# Patient Record
Sex: Female | Born: 1965 | Race: Black or African American | Hispanic: No | Marital: Married | Smoking: Former smoker
Health system: Southern US, Community
[De-identification: ages and names within clinical notes are randomized; demographics above are authoritative.]

## PROBLEM LIST (undated history)

## (undated) DIAGNOSIS — Z720 Tobacco use: Secondary | ICD-10-CM

## (undated) DIAGNOSIS — K219 Gastro-esophageal reflux disease without esophagitis: Secondary | ICD-10-CM

## (undated) DIAGNOSIS — I10 Essential (primary) hypertension: Secondary | ICD-10-CM

## (undated) DIAGNOSIS — M1990 Unspecified osteoarthritis, unspecified site: Secondary | ICD-10-CM

## (undated) DIAGNOSIS — F411 Generalized anxiety disorder: Secondary | ICD-10-CM

## (undated) HISTORY — PX: ABDOMINAL HYSTERECTOMY: SHX81

## (undated) HISTORY — DX: Gastro-esophageal reflux disease without esophagitis: K21.9

## (undated) HISTORY — DX: Tobacco use: Z72.0

## (undated) HISTORY — PX: JOINT REPLACEMENT: SHX530

## (undated) HISTORY — DX: Generalized anxiety disorder: F41.1

## (undated) HISTORY — PX: CHOLECYSTECTOMY: SHX55

---

## 2005-01-21 ENCOUNTER — Ambulatory Visit: Payer: Self-pay

## 2005-01-22 ENCOUNTER — Other Ambulatory Visit: Payer: Self-pay

## 2005-01-22 ENCOUNTER — Observation Stay: Payer: Self-pay | Admitting: Internal Medicine

## 2005-01-23 ENCOUNTER — Other Ambulatory Visit: Payer: Self-pay

## 2005-02-13 ENCOUNTER — Ambulatory Visit: Payer: Self-pay | Admitting: Internal Medicine

## 2005-06-18 ENCOUNTER — Emergency Department: Payer: Self-pay | Admitting: Emergency Medicine

## 2005-06-19 ENCOUNTER — Other Ambulatory Visit: Payer: Self-pay

## 2008-03-22 ENCOUNTER — Other Ambulatory Visit: Payer: Self-pay

## 2008-03-22 ENCOUNTER — Emergency Department: Payer: Self-pay | Admitting: Internal Medicine

## 2008-10-23 ENCOUNTER — Emergency Department: Payer: Self-pay | Admitting: Emergency Medicine

## 2009-04-25 ENCOUNTER — Emergency Department: Payer: Self-pay | Admitting: Emergency Medicine

## 2009-11-28 ENCOUNTER — Ambulatory Visit: Payer: Self-pay | Admitting: Family Medicine

## 2010-08-16 ENCOUNTER — Emergency Department: Payer: Self-pay | Admitting: Emergency Medicine

## 2011-11-27 ENCOUNTER — Ambulatory Visit: Payer: Self-pay | Admitting: Family Medicine

## 2013-03-21 LAB — CBC
HCT: 38.6 % (ref 35.0–47.0)
HGB: 12.9 g/dL (ref 12.0–16.0)
MCHC: 33.5 g/dL (ref 32.0–36.0)
RBC: 4.65 10*6/uL (ref 3.80–5.20)
RDW: 14.7 % — ABNORMAL HIGH (ref 11.5–14.5)
WBC: 8.6 10*3/uL (ref 3.6–11.0)

## 2013-03-21 LAB — COMPREHENSIVE METABOLIC PANEL
Albumin: 4.1 g/dL (ref 3.4–5.0)
Anion Gap: 2 — ABNORMAL LOW (ref 7–16)
BUN: 22 mg/dL — ABNORMAL HIGH (ref 7–18)
Bilirubin,Total: 0.3 mg/dL (ref 0.2–1.0)
Calcium, Total: 10.2 mg/dL — ABNORMAL HIGH (ref 8.5–10.1)
Chloride: 105 mmol/L (ref 98–107)
Creatinine: 0.87 mg/dL (ref 0.60–1.30)
EGFR (Non-African Amer.): >60
Glucose: 117 mg/dL — ABNORMAL HIGH (ref 65–99)
Osmolality: 282 (ref 275–301)
SGPT (ALT): 66 U/L (ref 12–78)
Sodium: 139 mmol/L (ref 136–145)

## 2013-03-21 LAB — TROPONIN I: Troponin-I: <0.02 ng/mL

## 2013-03-22 ENCOUNTER — Observation Stay: Payer: Self-pay | Admitting: Internal Medicine

## 2013-03-22 LAB — TROPONIN I
Troponin-I: <0.02 ng/mL
Troponin-I: <0.02 ng/mL

## 2013-03-22 LAB — CK TOTAL AND CKMB (NOT AT ARMC)
CK, Total: 124 U/L (ref 21–215)
CK, Total: 122 U/L (ref 21–215)
CK-MB: <0.5 ng/mL — ABNORMAL LOW (ref 0.5–3.6)
CK-MB: <0.5 ng/mL — ABNORMAL LOW (ref 0.5–3.6)

## 2014-09-24 ENCOUNTER — Emergency Department: Payer: Self-pay | Admitting: Emergency Medicine

## 2014-11-17 NOTE — H&P (Signed)
Johnston NAME:  [name] Johnston, [name] Johnston MR#:  [id number] DATE OF BIRTH:  1965/08/08  DATE OF ADMISSION:  03/22/2013  PRIMARY CARE PHYSICIAN: Nonlocal.     REFERRING PHYSICIAN: Dr. Brandt Loosen.   CHIEF COMPLAINT: Chest pain.   HISTORY OF PRESENT ILLNESS: [name] Johnston is a 49 year old pleasant African American female with past medical history of hypertension, poorly controlled, continued tobacco use, who presented to [name] with complaints of left-sided chest pain that started about a week back. [name] pain was above her left breast area on and off pain. Could not tell exacerbating and relieving factors. Since last night she started to experience tightness and pain in [name] left arm.  Concerning this, came to [name]. Work-up in [name] with EKG and cardiac enzymes were unremarkable. [name] Johnston has history of a mother who had a heart attack in her 57s. Father had stroke in early 57s. Currently resides in a nursing home. [name] Johnston continues to smoke 1 pack a day.   PAST MEDICAL HISTORY:  Hypertension, poorly controlled.   PAST SURGICAL HISTORY:   1.  Cholecystectomy.  2.  Hysterectomy.   ALLERGIES: PENICILLIN.   HOME MEDICATIONS:  Lisinopril hydrochlorothiazide daily.   SOCIAL HISTORY: Continues to smoke 1 pack a day. Drinks alcohol over [name] weekend. Denies using any illicit drug . Married, lives with her husband.   FAMILY HISTORY: As mentioned above, mother had an MI in her 56s. Father had stroke.   REVIEW OF SYSTEMS: CONSTITUTIONAL: Denies any generalized weakness and weight loss.  EYES: No change in vision.  ENT: No change in hearing. No sore throat.  RESPIRATORY: No cough, shortness of breath. CARDIOVASCULAR: Chest pain. No pedal edema.  GASTROINTESTINAL: No nausea, vomiting, abdominal pain.  GENITOURINARY: No dysuria or hematuria.   SKIN: No rashes or lesions.  ENDOCRINE: No polyuria or polydipsia.  HEMATOLOGIC: No easy bruising or  bleeding.  MUSCULOSKELETAL: No joint pains or swelling.  NEUROLOGIC: No weakness or numbness in any part of [name] body.   PHYSICAL EXAMINATION: GENERAL: [name] Johnston is a well-built, well-nourished, age-appropriate female lying down in [name] bed, not in distress.  VITAL SIGNS: Temperature 98.5, pulse 78, blood pressure 170/86, respiratory rate of 16, oxygen saturation is 98% on room air.  HEENT: Head normocephalic, atraumatic. No sclerae icterus. Conjunctivae normal. Pupils equal and react to light. Extraocular movements are intact. Mucous membranes moist. No pharyngeal erythema.  NECK: Supple. No lymphadenopathy. No JVD. No carotid bruit. No thyromegaly.  CHEST: Has focal tenderness on [name] left side of [name] chest around [name] left shoulder joint.  LUNGS:  Bilaterally clear to auscultation.  HEART: S1, S2 regular. No murmurs are heard. No pedal edema. Pulses 2+.  ABDOMEN: Bowel sounds present. Soft, nontender, nondistended. No hepatosplenomegaly.  SKIN: No rash or lesions.  MUSCULOSKELETAL: No joint pains and aches.  NEUROLOGIC:  [name] Johnston is alert, oriented to place, person and time. Cranial nerves II through XII intact. Motor 5/5 in upper and lower extremities.   LABS: CBC: WBC is well within normal limits. CMP is completely within normal limits.   Troponin less than 0.02. EKG, 12-lead: Normal sinus rhythm with no ST-T wave abnormalities.   ASSESSMENT AND PLAN: [name] Johnston is a 49 year old female who comes to [name] with complaints of chest pain.   1.  Chest pain.  It seems this is more of a musculoskeletal pain. Rule out with cardiac enzymes. [name] Johnston can have exercise stress echo prior to discharge. 2.  Hypertension, poorly controlled. Continue with lisinopril hydrochlorothiazide. If not controlled, we will also added Norvasc to [name] regimen.  3.  Tobacco use. Counseled with [name] Johnston for 5 minutes. [name] Johnston expressed understanding and stated that [name] Johnston is going  to quit. Will provide nicotine patch.  4.  Keep [name] Johnston on deep vein thrombosis prophylaxis with Lovenox.   TIME SPENT: 45 minutes.     ____________________________ Susa GriffinsPadmaja Walker Paddack, MD pv:dp D: 03/22/2013 02:31:35 ET T: 03/22/2013 06:02:27 ET JOB#: [id number]  cc: Susa GriffinsPadmaja Langley Flatley, MD, <Dictator> Susa GriffinsPADMAJA Francille Wittmann MD ELECTRONICALLY SIGNED 03/30/2013 22:05

## 2014-11-17 NOTE — Consult Note (Signed)
General Aspect 49 year old [place] American female with history of tobacco abuse family history CAD presenting to the ER and admitted for observation of chest discomfort.  Patient described this as a left upper chest discomfort that has been present mostly for the past week.  It usually is worse in a lying position.  Exertion does not bring it on or worsening the pain.  She has tried Alka-Seltzer's, tum's and aspirin, getting the most relief from Alka-Seltzer's at home.  Currently she has complete relief of chest discomfort wearing a nitroglycerin patch but is complaining of headache.  Cardiology has been requested to evaluate patient for discharge this afternoon with return for outpatient stress testin am. Currently her discomfort is worsened with movement of the upper torso can be reproduced with palpation of chest wall, most compatible with musculoskeletal pain.  Her cardiac enzymes have been negative.No arrhythmia on telemetry. Denied any shortness of breath nausea, diaphoresis with discomfort. No acute changes on EKG. Cardiac enzymes are negative. Takes amlodipine and hydrochlorothiazide for blood pressure management.   Physical Exam:  GEN well developed, no acute distress   HEENT pink conjunctivae, hearing intact to voice   RESP normal resp effort  clear BS   CARD Regular rate and rhythm  Normal, S1, S2  No murmur  tender on palpation of the chest wall left of the sternum   ABD denies tenderness  normal BS   EXTR negative edema   SKIN normal to palpation   NEURO cranial nerves intact, motor/sensory function intact   PSYCH alert, A+O to time, place, person, good insight   Review of Systems:  Subjective/Chief Complaint Nochest pain at present.   General: headache probably from nitrates   Respiratory: history of tobacco abuse   Cardiovascular: Chest pain or discomfort  resolved   Musculoskeletal: pain with movement of the upper torso and reproduced with palpation of the sternum    Review of Systems: All other systems were reviewed and found to be negative   Lab Results: Hepatic:  25-Aug-14 22:38   Bilirubin, Total 0.3  Alkaline Phosphatase 81  SGPT (ALT) 66  SGOT (AST) 33  Total Protein, Serum 8.0  Albumin, Serum 4.1  Cardiology:  25-Aug-14 22:38   Ventricular Rate 83  Atrial Rate 83  P-R Interval 164  QRS Duration 86  QT 362  QTc 425  P Axis 49  R Axis 53  T Axis 23  ECG interpretation Normal sinus rhythm Possible Left atrial enlargement Nonspecific ST abnormality Abnormal ECG When compared with ECG of 16-Aug-2010 21:43, No significant change was found ----------unconfirmed---------- Confirmed by OVERREAD, NOT ([id number]), editor PEARSON, BARBARA ([id number]) on 03/22/2013 10:30:15 AM  Routine Chem:  25-Aug-14 22:38   Calcium (Total), Serum  10.2  Glucose, Serum  117  BUN  22  Creatinine (comp) 0.87  Sodium, Serum 139  Potassium, Serum 3.7  CO2, Serum 32  Osmolality (calc) 282  eGFR (African American) >60  eGFR (Non-African American) >60 (eGFR values <29m/min/1.73 m2 may be an indication of chronic kidney disease (CKD). Calculated eGFR is useful in patients with stable renal function. The eGFR calculation will not be reliable in acutely ill patients when serum creatinine is changing rapidly. It is not useful in  patients on dialysis. The eGFR calculation may not be applicable to patients at the low and high extremes of body sizes, pregnant women, and vegetarians.)  Anion Gap  2  Cardiac:  25-Aug-14 22:38   Troponin I < 0.02 (0.00-0.05 0.05 ng/mL or less: NEGATIVE  Repeat testing in 3-6 hrs  if clinically indicated. >0.05 ng/mL: POTENTIAL  MYOCARDIAL INJURY. Repeat  testing in 3-6 hrs if  clinically indicated. NOTE: An increase or decrease  of 30% or more on serial  testing suggests a  clinically important change)  Routine Hem:  25-Aug-14 22:38   WBC (CBC) 8.6  RBC (CBC) 4.65  Hemoglobin (CBC) 12.9  Hematocrit (CBC) 38.6  Platelet  Count (CBC) 266 (Result(s) reported on 21 Mar 2013 at 10:55PM.)  MCV 83  MCH 27.8  MCHC 33.5  RDW  14.7   Radiology Results: XRay:    25-Aug-14 22:51, Chest PA and Lateral  Chest PA and Lateral   REASON FOR EXAM:    chest pain  COMMENTS:       PROCEDURE: DXR - DXR CHEST PA (OR AP) AND LATERAL  - Mar 21 2013 10:51PM     RESULT: Comparison: None    Findings:     PA and lateral chest radiographs are provided.  There is no focal   parenchymal opacity, pleural effusion, or pneumothorax. The heart and   mediastinum are unremarkable.  The osseous structures are unremarkable.    IMPRESSION:   No acute disease of the chest.    Dictation Site: 1        Verified By: Jennette Banker, M.D., MD  Cardiology:    25-Aug-14 22:38, ED ECG  ECG interpretation   Normal sinus rhythm  Possible Left atrial enlargement  Nonspecific ST abnormality  Abnormal ECG  When compared with ECG of 16-Aug-2010 21:43,  No significant change was found  ----------unconfirmed----------  Confirmed by OVERREAD, NOT ([id number]), editor PEARSON, BARBARA ([id number]) on 03/22/2013 10:30:15 AM    Penicillin: Unknown  Vital Signs/Nurse's Notes: **Vital Signs.:   26-Aug-14 12:00  Vital Signs Type Routine  Temperature Temperature (F) 98.2  Celsius 36.7  Temperature Source oral  Pulse Pulse 64  Systolic BP Systolic BP 098  Diastolic BP (mmHg) Diastolic BP (mmHg) 52  Mean BP 72  Pulse Ox % Pulse Ox % 98  Oxygen Delivery Room Air/ 21 %  Pulse Ox Heart Rate 66    Impression 1.  Atypical chest pain possibly muscle skeletal in nature.   Plan 1.  Patient to be discharged home with  for return in a.m. for outpatient stress test. 2.  Smoking cessation encouraged. 3.  Continue current medication for blood pressure management which seems to be under good control. 4.  Further recommendations per Dr. Nehemiah Massed once results of the stress test are known.  Patient was seen in collaboration with Dr. Nehemiah Massed who agrees with the  above plan.   Electronic Signatures: Roderic Palau (NP)  (Signed 26-Aug-14 15:40)  Authored: General Aspect/Present Illness, History and Physical Exam, Review of System, Labs, Radiology, Allergies, Vital Signs/Nurse's Notes, Impression/Plan   Last Updated: 26-Aug-14 15:40 by Roderic Palau (NP)

## 2014-11-17 NOTE — Discharge Summary (Signed)
PATIENT NAME:  [name], [name] MR#:  [id number] DATE OF BIRTH:  1966-02-13  DATE OF ADMISSION:  03/22/2013 DATE OF DISCHARGE:  03/22/2013  ADMITTING DIAGNOSIS: Chest pain.   DISCHARGE DIAGNOSES:  1.  Chest pain of unclear etiology at this time, likely musculoskeletal.  2.  Mild chronic obstructive pulmonary disease, suspected.  3.  Tobacco abuse.  4.  Hypertension.   DISCHARGE CONDITION: Stable.   DISCHARGE MEDICATIONS: The patient is to continue amlodipine 10 mg p.o. daily, hydrochlorothiazide/lisinopril 12.5 mg/10 mg two tablets once daily, albuterol ipratropium CFC-free 100/20, one puff 4 times daily, this is a new medication as well as Norflex 100 mg p.o. twice daily, also new medication.   HOME OXYGEN: None.   DIET: Two grams salt, low fat, low cholesterol. Diet consistency: Regular.   ACTIVITY LIMITATIONS: As tolerated.   FOLLOWUP APPOINTMENT: Cardiology, Dr. Gwen PoundsKowalski, for stress test tomorrow on March 23, 2013. Further recommendations per Dr. Gwen PoundsKowalski according to her test results.   CONSULTANTS: Care management as well as Rudi Cocoonna Carroll, nurse practitioner, for Dr. Gwen PoundsKowalski.   RADIOLOGIC STUDIES: Chest x-ray, PA and lateral, done March 17, 2013, showed no acute disease of the chest.   The patient is a 49 year old African American female with past medical history significant for history of hypertension, who presents to the hospital with complaints of chest pains. Please refer to Dr. Clarita LeberVasireddy's admission note on March 22, 2013.   According to the patient, she was having pain for approximately a week now. Pain was above the left breast area, also associated with shoulder joint on the left side discomfort.   The patient continues to smoke at least 1 pack a day.   PHYSICAL EXAM: Unremarkable.   EKG showed normal sinus rhythm with no ST-T abnormalities.   Her labs were unremarkable. Troponin level less than 0.02. Potassium level initially on March 17, 2013, was elevated  to 10.2. Liver enzymes were normal. Cardiac enzymes were negative x3 while she was in the hospital.   The patient was evaluated by cardiology, Ms. Rudi Cocoonna Carroll, who felt that the patient had atypical chest pain and recommended to discharge the patient home, return for outpatient stress test tomorrow, on March 23, 2013. Also smoking cessation was recommended and encouraged and medication management for blood pressure.   The patient's risk factors for cardiovascular disease were evaluated. The patient did not have any other risk factors evaluated for cardiovascular disease. It was felt that patient should undergo a stress test and then certify her risks accordingly.   VITAL SIGNS: Stable on day of discharge, March 22, 2013. Temperature 98.2. Pulse was 60s to 70s. Respiratory rate was 18. Blood pressure 112/52. Saturation was 98% to 99% on room air at rest.   ASSESSMENT AND PLAN:  1.  In regard to tobacco abuse, the patient was advised to quit.  2.  For hypertension, the patient is to continue her outpatient medications. Her blood pressure was well controlled while she was in the hospital.  3.  The patient was complaining of some shortness of breath, especially on exertion. She also admitted of having some cough intermittently with no significant sputum production. It was felt that the patient may have underlying chronic obstructive pulmonary disease based on her somewhat diminished (Dictation Anomaly)auscultatory breath sounds on lung exam. She was advised to restart on Combivent as needed.  4.  In regards to musculoskeletal discomfort, she was also advised to take muscle relaxant Norflex with hopes that that will alleviate her discomfort.  5.  In regards to (Dictation Anomaly)hypokalcemia, , the patient's calcium level was rechecked and on March 22, 2013, the patient's calcium level was normal at 9.8.   The patient is being discharged with above-mentioned medications and followup.   TIME SPENT:  40 minutes on this patient.     ____________________________ Katharina Caper, MD rv:np D: 03/22/2013 18:51:00 ET T: 03/22/2013 19:35:02 ET JOB#: [id number]  cc: Katharina Caper, MD, <Dictator> Coreon Simkins MD ELECTRONICALLY SIGNED 04/16/2013 17:53

## 2015-10-24 ENCOUNTER — Other Ambulatory Visit: Payer: Self-pay | Admitting: Family Medicine

## 2015-10-24 DIAGNOSIS — Z1231 Encounter for screening mammogram for malignant neoplasm of breast: Secondary | ICD-10-CM

## 2016-04-04 ENCOUNTER — Emergency Department: Payer: Self-pay

## 2016-04-04 ENCOUNTER — Emergency Department
Admission: EM | Admit: 2016-04-04 | Discharge: 2016-04-04 | Disposition: A | Discharge status: Home / Self Care | Payer: Self-pay | Attending: Emergency Medicine | Admitting: Emergency Medicine

## 2016-04-04 DIAGNOSIS — I10 Essential (primary) hypertension: Secondary | ICD-10-CM | POA: Insufficient documentation

## 2016-04-04 DIAGNOSIS — F17200 Nicotine dependence, unspecified, uncomplicated: Secondary | ICD-10-CM | POA: Insufficient documentation

## 2016-04-04 DIAGNOSIS — R079 Chest pain, unspecified: Secondary | ICD-10-CM | POA: Insufficient documentation

## 2016-04-04 HISTORY — DX: Essential (primary) hypertension: I10

## 2016-04-04 LAB — BASIC METABOLIC PANEL
ANION GAP: 8 (ref 5–15)
BUN: 15 mg/dL (ref 6–20)
CALCIUM: 9.8 mg/dL (ref 8.9–10.3)
CHLORIDE: 108 mmol/L (ref 101–111)
CO2: 26 mmol/L (ref 22–32)
Creatinine, Ser: 0.94 mg/dL (ref 0.44–1.00)
GFR calc non Af Amer: >60 mL/min (ref >60)
Glucose, Bld: 103 mg/dL — ABNORMAL HIGH (ref 65–99)
Potassium: 4.1 mmol/L (ref 3.5–5.1)
SODIUM: 142 mmol/L (ref 135–145)

## 2016-04-04 LAB — CBC
HCT: 45.4 % (ref 35.0–47.0)
HEMOGLOBIN: 14.8 g/dL (ref 12.0–16.0)
MCH: 27 pg (ref 26.0–34.0)
MCHC: 32.5 g/dL (ref 32.0–36.0)
MCV: 83.2 fL (ref 80.0–100.0)
PLATELETS: 256 10*3/uL (ref 150–440)
RBC: 5.46 MIL/uL — AB (ref 3.80–5.20)
RDW: 14 % (ref 11.5–14.5)
WBC: 8.1 10*3/uL (ref 3.6–11.0)

## 2016-04-04 LAB — TROPONIN I

## 2016-04-04 NOTE — ED Notes (Signed)

## 2016-04-04 NOTE — ED Provider Notes (Signed)
[facility] Emergency Department Provider Note   ____________________________________________   First MD Initiated Contact with Patient 04/04/16 1429     (approximate)  I have reviewed the triage vital signs and the nursing notes.   HISTORY  Chief Complaint Chest Pain   HPI [name] is a 50 y.o. female with a history of hypertension with presented to emergency department with left-sided chest pain. She says that she has left sided chest pain intermittently and will last for several seconds. She says it is a sharp type pain without any radiation. However, she says when it is there it is a 10 out of 10. She says that it can come at any time. It does not worsen with exertion. It does not worsen with movement. She does not note any heavy lifting or injury. She says that she had a similar pain several years ago and had a stress test which was normal. She has no history of heart attack. No family history of coronary artery disease. She takes a baby aspirin daily at home as well as her blood pressure medications. She denies taking any hormone supplements of birth control. At this time. Says that she has had one episode of the pain today after which she became diaphoretic but says that she has been becoming diaphoretic intermittently lately secondary to hot flashes which she believes are from menopause.   Past Medical History:  Diagnosis Date  . Hypertension     There are no active problems to display for this patient.   History reviewed. No pertinent surgical history.  Prior to Admission medications   Not on File    Allergies Penicillins  No family history on file.  Social History Social History  Substance Use Topics  . Smoking status: Current Every Day Smoker  . Smokeless tobacco: Not on file  . Alcohol use Yes    Review of Systems Constitutional: No fever/chills Eyes: No visual changes. ENT: No sore throat. Cardiovascular: As  above Respiratory: Denies shortness of breath. Gastrointestinal: No abdominal pain.  No nausea, no vomiting.  No diarrhea.  No constipation. Genitourinary: Negative for dysuria. Musculoskeletal: Negative for back pain. Skin: Negative for rash. Neurological: Negative for headaches, focal weakness or numbness.  10-point ROS otherwise negative.  ____________________________________________   PHYSICAL EXAM:  VITAL SIGNS: ED Triage Vitals  Enc Vitals Group     BP 04/04/16 1245 (!) 157/91     Pulse Rate 04/04/16 1245 96     Resp 04/04/16 1245 18     Temp 04/04/16 1245 98.6 F (37 C)     Temp Source 04/04/16 1245 Oral     SpO2 04/04/16 1245 100 %     Weight 04/04/16 1246 179 lb (81.2 kg)     Height 04/04/16 1246 5\' 1"  (1.549 m)     Head Circumference --      Peak Flow --      Pain Score --      Pain Loc --      Pain Edu? --      Excl. in GC? --     Constitutional: Alert and oriented. Well appearing and in no acute distress. Eyes: Conjunctivae are normal. PERRL. EOMI. Head: Atraumatic. Nose: No congestion/rhinnorhea. Mouth/Throat: Mucous membranes are moist.   Neck: No stridor.   Cardiovascular: Normal rate, regular rhythm. Grossly normal heart sounds.  Good peripheral circulation With intact, equal and bilateral dorsalis pedis as well as radial pulses. Chest pain is reproducible to palpation just  left of the sternum over the area where the patient says that she has been having her pain. Respiratory: Normal respiratory effort.  No retractions. Lungs CTAB. Gastrointestinal: Soft and nontender. No distention.  Musculoskeletal: No lower extremity tenderness nor edema.  No joint effusions. Neurologic:  Normal speech and language. No gross focal neurologic deficits are appreciated.  Skin:  Skin is warm, dry and intact. No rash noted. Psychiatric: Mood and affect are normal. Speech and behavior are normal.  ____________________________________________   LABS (all labs ordered  are listed, but only abnormal results are displayed)  Labs Reviewed  BASIC METABOLIC PANEL - Abnormal; Notable for the following:       Result Value   Glucose, Bld 103 (*)    All other components within normal limits  CBC - Abnormal; Notable for the following:    RBC 5.46 (*)    All other components within normal limits  TROPONIN I   ____________________________________________  EKG  ED ECG REPORT I, Arelia LongestSchaevitz,  Dacota Ruben M, the attending physician, personally viewed and interpreted this ECG.   Date: 04/04/2016  EKG Time: 1243  Rate: 86  Rhythm: normal sinus rhythm  Axis: Normal axis  Intervals:none  ST&T Change: No ST segment elevation or depression. No abnormal T-wave inversion.  ____________________________________________  RADIOLOGY  DG Chest 2 View (Accession [id number]) (Order [id number])  Imaging  Date: 04/04/2016 Department: [facility] EMERGENCY DEPARTMENT Released By: Lynelle SmokeAllyson Y Riley, RN (auto-released) Authorizing: Myrna Blazeravid Matthew Darcey Cardy, MD  PACS Images   Show images for DG Chest 2 View  Study Result   CLINICAL DATA:  Chest pain left side of sternum, intermittent X 2 weeks, smoker, HTN  EXAM: CHEST  2 VIEW  COMPARISON:  Chest radiograph 03/21/2013  FINDINGS: Normal mediastinum and cardiac silhouette. Normal pulmonary vasculature. No evidence of effusion, infiltrate, or pneumothorax. No acute bony abnormality.  IMPRESSION: Normal chest radiograph   Electronically Signed   By: Genevive BiStewart  Edmunds M.D.   On: 04/04/2016 13:22    ____________________________________________   PROCEDURES  Procedure(s) performed:   Procedures  Critical Care performed:   ____________________________________________   INITIAL IMPRESSION / ASSESSMENT AND PLAN / ED COURSE  Pertinent labs & imaging results that were available during my care of the patient were reviewed by me and considered in my medical decision making (see chart for  details).  Patient with very reassuring lab workup as well as EKG. Symptoms have been ongoing for about a week at this time. If the patient had a serious/life threatening causes ACS I believe that would be seeing some more ominous findings on her workup. Furthermore, her history as well as physical exam would be highly atypical for ACS. Also, the patient is PERC negative.  I believe she'll be appropriate for discharge at this time. I advised her to continue taking the 81 mg of aspirin at home. She has recently but a 325 mg while of aspirin but I advised her that the 81 mg of aspirin would likely be safer/was less side effects for her risk factors. She'll be following up with her primary care doctor. She is understanding the plan as well as the diagnosis, which we reviewed and she is willing to comply. We also reviewed the lab work and imaging. Chest pain likely from chest wall pain.  Clinical Course     ____________________________________________   FINAL CLINICAL IMPRESSION(S) / ED DIAGNOSES  Chest pain.    NEW MEDICATIONS STARTED DURING THIS VISIT:  New Prescriptions   No medications  on file     Note:  This document was prepared using Dragon voice recognition software and may include unintentional dictation errors.    Myrna Blazer, MD 04/04/16 1556

## 2016-04-04 NOTE — ED Triage Notes (Signed)
Pt arrives to ER via POV c/o intermittent CP X 1 week to left side of chest. Pain this AM, that lasted approx 2 minutes. Pt alert and oriented X4, active, cooperative, pt in NAD. RR even and unlabored, color WNL.

## 2017-03-24 ENCOUNTER — Encounter: Payer: Self-pay | Admitting: Emergency Medicine

## 2017-03-24 ENCOUNTER — Emergency Department: Payer: Self-pay

## 2017-03-24 ENCOUNTER — Emergency Department
Admission: EM | Admit: 2017-03-24 | Discharge: 2017-03-24 | Disposition: A | Discharge status: Home / Self Care | Payer: Self-pay | Attending: Emergency Medicine | Admitting: Emergency Medicine

## 2017-03-24 DIAGNOSIS — R0789 Other chest pain: Secondary | ICD-10-CM | POA: Insufficient documentation

## 2017-03-24 DIAGNOSIS — Z79899 Other long term (current) drug therapy: Secondary | ICD-10-CM | POA: Insufficient documentation

## 2017-03-24 DIAGNOSIS — I10 Essential (primary) hypertension: Secondary | ICD-10-CM | POA: Insufficient documentation

## 2017-03-24 DIAGNOSIS — Z7982 Long term (current) use of aspirin: Secondary | ICD-10-CM | POA: Insufficient documentation

## 2017-03-24 DIAGNOSIS — F17200 Nicotine dependence, unspecified, uncomplicated: Secondary | ICD-10-CM | POA: Insufficient documentation

## 2017-03-24 LAB — CBC
HEMATOCRIT: 42.6 % (ref 35.0–47.0)
Hemoglobin: 13.9 g/dL (ref 12.0–16.0)
MCH: 27 pg (ref 26.0–34.0)
MCHC: 32.7 g/dL (ref 32.0–36.0)
MCV: 82.4 fL (ref 80.0–100.0)
Platelets: 282 10*3/uL (ref 150–440)
RBC: 5.17 MIL/uL (ref 3.80–5.20)
RDW: 14.1 % (ref 11.5–14.5)
WBC: 6.4 10*3/uL (ref 3.6–11.0)

## 2017-03-24 LAB — BASIC METABOLIC PANEL
ANION GAP: 8 (ref 5–15)
BUN: 18 mg/dL (ref 6–20)
CALCIUM: 9.9 mg/dL (ref 8.9–10.3)
CO2: 26 mmol/L (ref 22–32)
Chloride: 106 mmol/L (ref 101–111)
Creatinine, Ser: 0.8 mg/dL (ref 0.44–1.00)
GLUCOSE: 121 mg/dL — AB (ref 65–99)
POTASSIUM: 3.5 mmol/L (ref 3.5–5.1)
Sodium: 140 mmol/L (ref 135–145)

## 2017-03-24 LAB — TROPONIN I

## 2017-03-24 NOTE — ED Provider Notes (Signed)
[facility] Emergency Department Provider Note   ____________________________________________    I have reviewed the triage vital signs and the nursing notes.   HISTORY  Chief Complaint Chest Pain     HPI [name] is a 51 y.o. female who presents with complaints of chest pain. Patient reports left-sided chest aching that is moderate that is constant. This started at 4 AM today. She reports this has been an intermittent pain over the last 2 weeks. Does not seem to be related to exertion. No history of heart disease although mother had open heart surgery. No pleurisy. She denies shortness of breath. No recent travel. No calf pain or swelling. No fevers or chills. No nausea or vomiting or diaphoresis.    Past Medical History:  Diagnosis Date  . Hypertension     There are no active problems to display for this patient.   History reviewed. No pertinent surgical history.  Prior to Admission medications   Medication Sig Start Date End Date Taking? Authorizing Provider  amLODipine (NORVASC) 10 MG tablet Take 10 mg by mouth daily.   Yes [provider]  aspirin EC 81 MG tablet Take 162 mg by mouth daily.   Yes [provider]  busPIRone (BUSPAR) 5 MG tablet Take 5 mg by mouth 2 (two) times daily as needed for anxiety.   Yes [provider]  omeprazole (PRILOSEC) 10 MG capsule Take 10 mg by mouth daily.   Yes [provider]     Allergies Penicillins  History reviewed. No pertinent family history.  Social History Social History  Substance Use Topics  . Smoking status: Current Every Day Smoker  . Smokeless tobacco: Never Used  . Alcohol use Yes    Review of Systems  Constitutional: No fever/chills Eyes: No visual changes.  ENT: No sore throat. Cardiovascular: As above Respiratory: Denies shortness of breath. Gastrointestinal: No abdominal pain.  No nausea, no vomiting.   Genitourinary: Negative  for dysuria. Musculoskeletal: Negative for back pain. Skin: Negative for rash. Neurological: Negative for headaches   ____________________________________________   PHYSICAL EXAM:  VITAL SIGNS: ED Triage Vitals  Enc Vitals Group     BP 03/24/17 0747 (!) 156/90     Pulse Rate 03/24/17 0747 97     Resp 03/24/17 0747 18     Temp 03/24/17 0747 99.1 F (37.3 C)     Temp Source 03/24/17 0747 Oral     SpO2 03/24/17 0747 97 %     Weight 03/24/17 0744 83.5 kg (184 lb)     Height 03/24/17 0744 1.549 m (5\' 1" )     Head Circumference --      Peak Flow --      Pain Score 03/24/17 0744 7     Pain Loc --      Pain Edu? --      Excl. in GC? --     Constitutional: Alert and oriented. No acute distress.  Eyes: Conjunctivae are normal.   Nose: No congestion/rhinnorhea. Mouth/Throat: Mucous membranes are moist.    Cardiovascular: Normal rate, regular rhythm. Grossly normal heart sounds.  Good peripheral circulation. Respiratory: Normal respiratory effort.  No retractions. Lungs CTAB. Gastrointestinal: Soft and nontender. No distention.  No CVA tenderness. Genitourinary: deferred Musculoskeletal: No lower extremity tenderness nor edema.  Warm and well perfused Neurologic:  Normal speech and language. No gross focal neurologic deficits are appreciated.  Skin:  Skin is warm, dry and intact. No rash noted. Psychiatric: Mood  and affect are normal. Speech and behavior are normal.  ____________________________________________   LABS (all labs ordered are listed, but only abnormal results are displayed)  Labs Reviewed  BASIC METABOLIC PANEL - Abnormal; Notable for the following:       Result Value   Glucose, Bld 121 (*)    All other components within normal limits  CBC  TROPONIN I  TROPONIN I   ____________________________________________  EKG  ED ECG REPORT I, Jene Every, the attending physician, personally viewed and interpreted this ECG.  Date: 03/24/2017  Rate:  92 Rhythm: normal sinus rhythm QRS Axis: normal Intervals: normal ST/T Wave abnormalities: normal   ____________________________________________  RADIOLOGY  Chest x-ray pending ____________________________________________   PROCEDURES  Procedure(s) performed: No    Critical Care performed: No ____________________________________________   INITIAL IMPRESSION / ASSESSMENT AND PLAN / ED COURSE  Pertinent labs & imaging results that were available during my care of the patient were reviewed by me and considered in my medical decision making (see chart for details).  Patient presents with left-sided chest pain as described above although she reports she is feeling much better now. EKG is reassuring. We will check labs, x-ray and reevaluate  Lab work is reassuring, patient is chest pain-free, we will recheck troponin and if normal appropriate for outpatient follow-up  ----------------------------------------- 10:49 AM on 03/24/2017 ----------------------------------------- Second troponin is normal, patient remains chest pain-free.     ____________________________________________   FINAL CLINICAL IMPRESSION(S) / ED DIAGNOSES  Final diagnoses:  Atypical chest pain      NEW MEDICATIONS STARTED DURING THIS VISIT:  New Prescriptions   No medications on file     Note:  This document was prepared using Dragon voice recognition software and may include unintentional dictation errors.    Jene Every, MD 03/24/17 1050

## 2017-03-24 NOTE — ED Notes (Signed)
Pt to ed with c/o chest pain that awoke her at 4 am.  Pt states pain burning and sharp and made her feel weak all over,  Pt now reports headache.  Denies weakness now.  Also reports some dizziness with chest pain. +smoker.

## 2017-03-24 NOTE — ED Notes (Signed)
E signature pad not working.  Pt verbalized understanding of d/c instructions.

## 2017-03-24 NOTE — ED Triage Notes (Signed)
Pt c/o left sided chest pain that woke her up at 4 am this morning.  Denies any other associated sx.  Pain is constant and described as aching.  Does not radiate.  Unlabored respirations.

## 2017-08-12 ENCOUNTER — Encounter: Payer: Self-pay | Admitting: Emergency Medicine

## 2017-08-12 ENCOUNTER — Emergency Department: Payer: Managed Care, Other (non HMO)

## 2017-08-12 DIAGNOSIS — Z7982 Long term (current) use of aspirin: Secondary | ICD-10-CM | POA: Diagnosis not present

## 2017-08-12 DIAGNOSIS — Z79899 Other long term (current) drug therapy: Secondary | ICD-10-CM | POA: Diagnosis not present

## 2017-08-12 DIAGNOSIS — I10 Essential (primary) hypertension: Secondary | ICD-10-CM | POA: Insufficient documentation

## 2017-08-12 DIAGNOSIS — R079 Chest pain, unspecified: Secondary | ICD-10-CM | POA: Diagnosis not present

## 2017-08-12 DIAGNOSIS — F17200 Nicotine dependence, unspecified, uncomplicated: Secondary | ICD-10-CM | POA: Diagnosis not present

## 2017-08-12 LAB — CBC
HEMATOCRIT: 43.6 % (ref 35.0–47.0)
Hemoglobin: 14.1 g/dL (ref 12.0–16.0)
MCH: 26.6 pg (ref 26.0–34.0)
MCHC: 32.3 g/dL (ref 32.0–36.0)
MCV: 82.3 fL (ref 80.0–100.0)
Platelets: 256 10*3/uL (ref 150–440)
RBC: 5.3 MIL/uL — AB (ref 3.80–5.20)
RDW: 14.1 % (ref 11.5–14.5)
WBC: 7 10*3/uL (ref 3.6–11.0)

## 2017-08-12 LAB — BASIC METABOLIC PANEL
Anion gap: 9 (ref 5–15)
BUN: 19 mg/dL (ref 6–20)
CHLORIDE: 103 mmol/L (ref 101–111)
CO2: 28 mmol/L (ref 22–32)
Calcium: 10.6 mg/dL — ABNORMAL HIGH (ref 8.9–10.3)
Creatinine, Ser: 0.9 mg/dL (ref 0.44–1.00)
GFR calc non Af Amer: >60 mL/min (ref >60)
Glucose, Bld: 103 mg/dL — ABNORMAL HIGH (ref 65–99)
POTASSIUM: 4 mmol/L (ref 3.5–5.1)
SODIUM: 140 mmol/L (ref 135–145)

## 2017-08-12 LAB — TROPONIN I: Troponin I: <0.03 ng/mL (ref <0.03)

## 2017-08-12 NOTE — ED Triage Notes (Signed)
Pt comes into the ED via POV c/o left sided chest pain that radiates to her left arm and accompanied with Nausea, dizziness, and shortness of breath.  Denies any diagnosed cardiac history.  Patient states the pain started around 17:30 today.  Patient is alert and oriented at this time with even and unlabored respirations.

## 2017-08-13 ENCOUNTER — Emergency Department
Admission: EM | Admit: 2017-08-13 | Discharge: 2017-08-13 | Disposition: A | Discharge status: Home / Self Care | Payer: Managed Care, Other (non HMO) | Attending: Emergency Medicine | Admitting: Emergency Medicine

## 2017-08-13 DIAGNOSIS — R079 Chest pain, unspecified: Secondary | ICD-10-CM

## 2017-08-13 DIAGNOSIS — I10 Essential (primary) hypertension: Secondary | ICD-10-CM

## 2017-08-13 LAB — TROPONIN I

## 2017-08-13 NOTE — Discharge Instructions (Signed)
Fortunately today your blood work, your chest x-ray, and your EKG were reassuring.  Please make an appointment to follow-up with cardiology this coming Monday for reevaluation and return to the emergency department sooner for any concerns.  It was a pleasure to take care of you today, and thank you for coming to our emergency department.  If you have any questions or concerns before leaving please ask the nurse to grab me and I'm more than happy to go through your aftercare instructions again.  If you were prescribed any opioid pain medication today such as Norco, Vicodin, Percocet, morphine, hydrocodone, or oxycodone please make sure you do not drive when you are taking this medication as it can alter your ability to drive safely.  If you have any concerns once you are home that you are not improving or are in fact getting worse before you can make it to your follow-up appointment, please do not hesitate to call 911 and come back for further evaluation.  Merrily BrittleNeil Yanna Leaks, MD  Results for orders placed or performed during the hospital encounter of 08/13/17  Basic metabolic panel  Result Value Ref Range   Sodium 140 135 - 145 mmol/L   Potassium 4.0 3.5 - 5.1 mmol/L   Chloride 103 101 - 111 mmol/L   CO2 28 22 - 32 mmol/L   Glucose, Bld 103 (H) 65 - 99 mg/dL   BUN 19 6 - 20 mg/dL   Creatinine, Ser 1.610.90 0.44 - 1.00 mg/dL   Calcium 09.610.6 (H) 8.9 - 10.3 mg/dL   GFR calc non Af Amer >60 >60 mL/min   GFR calc Af Amer >60 >60 mL/min   Anion gap 9 5 - 15  CBC  Result Value Ref Range   WBC 7.0 3.6 - 11.0 K/uL   RBC 5.30 (H) 3.80 - 5.20 MIL/uL   Hemoglobin 14.1 12.0 - 16.0 g/dL   HCT 04.543.6 40.935.0 - 81.147.0 %   MCV 82.3 80.0 - 100.0 fL   MCH 26.6 26.0 - 34.0 pg   MCHC 32.3 32.0 - 36.0 g/dL   RDW 91.414.1 78.211.5 - 95.614.5 %   Platelets 256 150 - 440 K/uL  Troponin I  Result Value Ref Range   Troponin I <0.03 <0.03 ng/mL  Troponin I  Result Value Ref Range   Troponin I <0.03 <0.03 ng/mL   Dg Chest 2  View  Result Date: 08/12/2017 CLINICAL DATA:  Chest pain EXAM: CHEST  2 VIEW COMPARISON:  03/24/2017 FINDINGS: The heart size and mediastinal contours are within normal limits. Both lungs are clear. The visualized skeletal structures are unremarkable. IMPRESSION: No active cardiopulmonary disease. Electronically Signed   By: Jasmine PangKim  Fujinaga M.D.   On: 08/12/2017 23:17

## 2017-08-13 NOTE — ED Provider Notes (Signed)
[facility] Emergency Department Provider Note  ____________________________________________   First MD Initiated Contact with Patient 08/13/17 0205     (approximate)  I have reviewed the triage vital signs and the nursing notes.   HISTORY  Chief Complaint Chest Pain   HPI DARIONNA BANKE is a 52 y.o. female who self presents to the emergency department with constant throbbing aching moderate severity left lateral chest pain radiating to her left arm associated with nausea shortness of breath.  It is nonexertional.  She has no leg swelling.  No history of DVT or pulmonary embolism.  No recent surgery travel or immobilization.  No cardiac history.  No diaphoresis.  The pain began around 530 today several hours prior to arrival.  The pain is been constant.  Nothing seems to make it better or worse.  Past Medical History:  Diagnosis Date  . Hypertension     There are no active problems to display for this patient.   Past Surgical History:  Procedure Laterality Date  . ABDOMINAL HYSTERECTOMY    . CHOLECYSTECTOMY      Prior to Admission medications   Medication Sig Start Date End Date Taking? Authorizing Provider  amLODipine (NORVASC) 10 MG tablet Take 10 mg by mouth daily.    [provider]  aspirin EC 81 MG tablet Take 162 mg by mouth daily.    [provider]  busPIRone (BUSPAR) 5 MG tablet Take 5 mg by mouth 2 (two) times daily as needed for anxiety.    [provider]  omeprazole (PRILOSEC) 10 MG capsule Take 10 mg by mouth daily.    [provider]    Allergies Penicillins  No family history on file.  Social History Social History   Tobacco Use  . Smoking status: Current Every Day Smoker  . Smokeless tobacco: Never Used  Substance Use Topics  . Alcohol use: Yes  . Drug use: No    Review of Systems Constitutional: No fever/chills Eyes: No visual changes. ENT: No sore throat. Cardiovascular:  Positive for chest pain. Respiratory: Positive for shortness of breath. Gastrointestinal: No abdominal pain.  Positive for nausea, no vomiting.  No diarrhea.  No constipation. Genitourinary: Negative for dysuria. Musculoskeletal: Negative for back pain. Skin: Negative for rash. Neurological: Negative for headaches, focal weakness or numbness.   ____________________________________________   PHYSICAL EXAM:  VITAL SIGNS: ED Triage Vitals  Enc Vitals Group     BP 08/12/17 2258 (!) 199/94     Pulse Rate 08/12/17 2258 87     Resp 08/12/17 2258 18     Temp 08/12/17 2258 98.2 F (36.8 C)     Temp Source 08/12/17 2258 Oral     SpO2 08/12/17 2258 97 %     Weight 08/12/17 2257 182 lb (82.6 kg)     Height 08/12/17 2257 5\' 1"  (1.549 m)     Head Circumference --      Peak Flow --      Pain Score 08/12/17 2256 6     Pain Loc --      Pain Edu? --      Excl. in GC? --     Constitutional: Alert and oriented x4 somewhat anxious appearing nontoxic no diaphoresis speaks full clear sentences Eyes: PERRL EOMI. Head: Atraumatic. Nose: No congestion/rhinnorhea. Mouth/Throat: No trismus Neck: No stridor.  Able lie completely flat with no JVD cardiovascular: Normal rate, regular rhythm. Grossly normal heart sounds.  Good peripheral circulation. Respiratory: Normal respiratory effort.  No retractions. Lungs CTAB and moving good air Gastrointestinal: Obese soft nontender Musculoskeletal: No lower extremity edema   Neurologic:  Normal speech and language. No gross focal neurologic deficits are appreciated. Skin:  Skin is warm, dry and intact. No rash noted. Psychiatric: Mood and affect are normal. Speech and behavior are normal.    ____________________________________________   DIFFERENTIAL includes but not limited to  Acute coronary syndrome, pulmonary embolism, aortic dissection, pneumothorax, muscular skeletal pain ____________________________________________   LABS (all labs ordered  are listed, but only abnormal results are displayed)  Labs Reviewed  BASIC METABOLIC PANEL - Abnormal; Notable for the following components:      Result Value   Glucose, Bld 103 (*)    Calcium 10.6 (*)    All other components within normal limits  CBC - Abnormal; Notable for the following components:   RBC 5.30 (*)    All other components within normal limits  TROPONIN I  TROPONIN I    Lab work reviewed by me with negative troponin x2 __________________________________________  EKG  ED ECG REPORT I, Merrily BrittleNeil Seba Madole, the attending physician, personally viewed and interpreted this ECG.  Date: 08/13/2017 EKG Time:  Rate: 82 Rhythm: normal sinus rhythm QRS Axis: normal Intervals: normal ST/T Wave abnormalities: normal Narrative Interpretation: no evidence of acute ischemia  ____________________________________________  RADIOLOGY  Chest x-ray reviewed by me with no acute disease ____________________________________________   PROCEDURES  Procedure(s) performed: no  Procedures  Critical Care performed: no  Observation: no ____________________________________________   INITIAL IMPRESSION / ASSESSMENT AND PLAN / ED COURSE  Pertinent labs & imaging results that were available during my care of the patient were reviewed by me and considered in my medical decision making (see chart for details).       ----------------------------------------- 3:58 AM on 08/13/2017 -----------------------------------------  Fortunately the patient's second troponin came back negative.  She has a heart score of 3.  I had a lengthy discussion with the patient regarding the diagnostic uncertainty, however at this point did not believe she requires inpatient admission for inpatient risk stratification.  I will refer her to cardiology as an outpatient.  Strict return precautions have been given and the patient verbalized understanding agree with plan.   ____________________________________________   FINAL CLINICAL IMPRESSION(S) / ED DIAGNOSES  Final diagnoses:  Hypertension, unspecified type  Nonspecific chest pain      NEW MEDICATIONS STARTED DURING THIS VISIT:  Discharge Medication List as of 08/13/2017  3:58 AM       Note:  This document was prepared using Dragon voice recognition software and may include unintentional dictation errors.     Merrily Brittleifenbark, Lyden Redner, MD 08/13/17 2238

## 2017-08-18 ENCOUNTER — Encounter: Payer: Self-pay | Admitting: Family Medicine

## 2017-08-18 ENCOUNTER — Ambulatory Visit (INDEPENDENT_AMBULATORY_CARE_PROVIDER_SITE_OTHER): Payer: Managed Care, Other (non HMO) | Admitting: Family Medicine

## 2017-08-18 VITALS — BP 138/84 | HR 92 | Temp 98.3°F | Resp 16 | Ht 61.0 in | Wt 183.3 lb

## 2017-08-18 DIAGNOSIS — Z1231 Encounter for screening mammogram for malignant neoplasm of breast: Secondary | ICD-10-CM | POA: Diagnosis not present

## 2017-08-18 DIAGNOSIS — Z23 Encounter for immunization: Secondary | ICD-10-CM | POA: Diagnosis not present

## 2017-08-18 DIAGNOSIS — Z131 Encounter for screening for diabetes mellitus: Secondary | ICD-10-CM | POA: Diagnosis not present

## 2017-08-18 DIAGNOSIS — F410 Panic disorder [episodic paroxysmal anxiety] without agoraphobia: Secondary | ICD-10-CM | POA: Diagnosis not present

## 2017-08-18 DIAGNOSIS — Z720 Tobacco use: Secondary | ICD-10-CM | POA: Insufficient documentation

## 2017-08-18 DIAGNOSIS — K219 Gastro-esophageal reflux disease without esophagitis: Secondary | ICD-10-CM | POA: Insufficient documentation

## 2017-08-18 DIAGNOSIS — I517 Cardiomegaly: Secondary | ICD-10-CM

## 2017-08-18 DIAGNOSIS — Z1239 Encounter for other screening for malignant neoplasm of breast: Secondary | ICD-10-CM

## 2017-08-18 DIAGNOSIS — Z1211 Encounter for screening for malignant neoplasm of colon: Secondary | ICD-10-CM | POA: Diagnosis not present

## 2017-08-18 DIAGNOSIS — I10 Essential (primary) hypertension: Secondary | ICD-10-CM | POA: Insufficient documentation

## 2017-08-18 DIAGNOSIS — Z13220 Encounter for screening for lipoid disorders: Secondary | ICD-10-CM | POA: Diagnosis not present

## 2017-08-18 DIAGNOSIS — Z114 Encounter for screening for human immunodeficiency virus [HIV]: Secondary | ICD-10-CM | POA: Diagnosis not present

## 2017-08-18 DIAGNOSIS — F411 Generalized anxiety disorder: Secondary | ICD-10-CM | POA: Insufficient documentation

## 2017-08-18 DIAGNOSIS — R0683 Snoring: Secondary | ICD-10-CM | POA: Insufficient documentation

## 2017-08-18 DIAGNOSIS — R011 Cardiac murmur, unspecified: Secondary | ICD-10-CM | POA: Insufficient documentation

## 2017-08-18 MED ORDER — RANITIDINE HCL 300 MG PO TABS: 300.0000 mg | ORAL_TABLET | Freq: Every day | ORAL | 0 refills | Status: DC | PRN

## 2017-08-18 MED ORDER — ESCITALOPRAM OXALATE 10 MG PO TABS: 10.0000 mg | ORAL_TABLET | Freq: Every day | ORAL | 1 refills | Status: DC

## 2017-08-18 MED ORDER — HYDRALAZINE HCL 10 MG PO TABS: 10.0000 mg | ORAL_TABLET | Freq: Two times a day (BID) | ORAL | 0 refills | Status: DC | PRN

## 2017-08-18 MED ORDER — HYDROCHLOROTHIAZIDE 12.5 MG PO TABS: 12.5000 mg | ORAL_TABLET | Freq: Every day | ORAL | 0 refills | Status: DC

## 2017-08-18 MED ORDER — BUSPIRONE HCL 5 MG PO TABS: 5.0000 mg | ORAL_TABLET | Freq: Two times a day (BID) | ORAL | 1 refills | Status: DC | PRN

## 2017-08-18 NOTE — Progress Notes (Signed)
Name: [name]   MRN: [id number]    DOB: January 31, 1966   Date:08/18/2017       Progress Note  Subjective  Chief Complaint  Chief Complaint  Patient presents with  . Establish Care  . Hypertension    Denies any symptoms  . Gastroesophageal Reflux    Well controlled with medication  . Anxiety    Does not feel like Buspar works- worries about everything all the time    HPI  HTN: she was diagnosed many years ago and [facility], however [facility] was [facility]. She went to [facility] this past week with very high bp and was sent here for follow up. She states bp goes up when nervous or during a panic attack, she was taking losartan/hctz and norvasc years ago but stopped on her own because of it caused headaches. She is currently feeling well.   Metabolic syndrome: she was told last year she has pre-diabetes but no labs since. She has polydipsia, she also has polyuria and episodes of polyphagia, we will recheck labs  Hypercalcemia: found while at the [facility], with symptoms of polydipsia and reflux we will check for hyperparathyroidism  GERD: used to take omeprazole but now only occasionally, discussed risk of long term use and she agrees on trying taking Ranitidine prn instead  Tobacco use: smokes half pack daily, willing to quit, she denies cough , SOB or wheezing.   GAD/Panic attacks: she states developed anxiety when her son was incarcerated over 7 years ago for dealing drugs. He is already out prison and doing well, but she continues to feel fearful. She is afraid that someone will break in her house, and recently had a panic attack while at work because there was a gentleman pacing in front of her job and she could not calm down, causing her to go to [facility]. She did well with buspar in the past  EKG during [facility] visit: showed enlarged left atrium, also history of snoring and daytime fatigue/sleepiness. We will check sleep study.  Patient Active Problem List   Diagnosis Date Noted   . Hypertension 08/18/2017  . GAD (generalized anxiety disorder) 08/18/2017  . GERD (gastroesophageal reflux disease) 08/18/2017  . Panic attack 08/18/2017  . Tobacco use 08/18/2017  . Snoring 08/18/2017    Past Surgical History:  Procedure Laterality Date  . ABDOMINAL HYSTERECTOMY     Complete Hysterectomy  . CHOLECYSTECTOMY      Family History  Problem Relation Age of Onset  . Hypertension Mother   . Stroke Father   . Hypertension Father   . Hypertension Sister     Social History   Socioeconomic History  . Marital status: Married    Spouse name: [name]   . Number of children: 3  . Years of education: Not on file  . Highest education level: 12th grade  Social Needs  . Financial resource strain: Not hard at all  . Food insecurity - worry: Never true  . Food insecurity - inability: Never true  . Transportation needs - medical: No  . Transportation needs - non-medical: No  Occupational History  . Occupation: medicare audits   Tobacco Use  . Smoking status: Current Every Day Smoker    Packs/day: 0.50    Years: 18.00    Pack years: 9.00    Types: Cigarettes    Start date: 08/19/1999  . Smokeless tobacco: Never Used  Substance and Sexual Activity  . Alcohol use: Yes  Alcohol/week: 1.8 oz    Types: 3 Glasses of wine per week  . Drug use: No  . Sexual activity: Yes    Partners: Male    Birth control/protection: Other-see comments    Comment: Hysterectomy  Other Topics Concern  . Not on file  Social History Narrative   Re-married since 2012   Only the two of them at home   She has three children from previous relationship and he has one   She works a full at [employer] and part time at [employer]    Her only son ( youngest child) went to prison for 3 years ( from 2014 - 2017), since than she has been anxious and scared of darkness.      Current Outpatient Medications:  .  aspirin EC 81 MG tablet, Take 162 mg by mouth daily., Disp: , Rfl:   .  busPIRone (BUSPAR) 5 MG tablet, Take 1 tablet (5 mg total) by mouth 2 (two) times daily as needed., Disp: 60 tablet, Rfl: 1 .  escitalopram (LEXAPRO) 10 MG tablet, Take 1 tablet (10 mg total) by mouth daily., Disp: 30 tablet, Rfl: 1 .  hydrALAZINE (APRESOLINE) 10 MG tablet, Take 1 tablet (10 mg total) by mouth 2 (two) times daily as needed. For bp above 150/90, Disp: 60 tablet, Rfl: 0 .  hydrochlorothiazide (HYDRODIURIL) 12.5 MG tablet, Take 1 tablet (12.5 mg total) by mouth daily., Disp: 90 tablet, Rfl: 0 .  ranitidine (ZANTAC) 300 MG tablet, Take 1 tablet (300 mg total) by mouth daily as needed for heartburn., Disp: 90 tablet, Rfl: 0  Allergies  Allergen Reactions  . Penicillins Itching    Has patient had a PCN reaction causing immediate rash, facial/tongue/throat swelling, SOB or lightheadedness with hypotension: No Has patient had a PCN reaction causing severe rash involving mucus membranes or skin necrosis: No Has patient had a PCN reaction that required hospitalization: No Has patient had a PCN reaction occurring within the last 10 years: No If all of the above answers are "NO", then may proceed with Cephalosporin use.      ROS  Constitutional: Negative for fever or weight change.  Respiratory: Negative for cough and shortness of breath.   Cardiovascular: Positive  for intermittent chest pain and palpitations.  Gastrointestinal: Negative for abdominal pain, no bowel changes.  Musculoskeletal: Negative for gait problem or joint swelling.  Skin: Negative for rash.  Neurological: Negative for dizziness or headache.  No other specific complaints in a complete review of systems (except as listed in HPI above).  Objective  Vitals:   08/18/17 1451  BP: 138/84  Pulse: 92  Resp: 16  Temp: 98.3 F (36.8 C)  TempSrc: Oral  SpO2: 98%  Weight: 183 lb 4.8 oz (83.1 kg)  Height: _0  (1.549 m)    Body mass index is 34.63 kg/m.  Physical Exam  Constitutional: Patient  appears well-developed and well-nourished. Obese No distress.  HEENT: head atraumatic, normocephalic, pupils equal and reactive to light, neck supple, throat within normal limits Cardiovascular: Normal rate, regular rhythm and normal heart sounds. Systolic ejection  murmur heard. on second right intercostal spaceNo BLE edema. Pulmonary/Chest: Effort normal and breath sounds normal. No respiratory distress. Abdominal: Soft.  There is no tenderness. Psychiatric: Patient has a normal mood and affect. behavior is normal. Judgment and thought content normal.  Recent Results (from the past 2160 hour(s))  Basic metabolic panel     Status: Abnormal   Collection Time: 08/12/17 10:58  PM  Result Value Ref Range   Sodium 140 135 - 145 mmol/L   Potassium 4.0 3.5 - 5.1 mmol/L   Chloride 103 101 - 111 mmol/L   CO2 28 22 - 32 mmol/L   Glucose, Bld 103 (H) 65 - 99 mg/dL   BUN 19 6 - 20 mg/dL   Creatinine, Ser 0.90 0.44 - 1.00 mg/dL   Calcium 10.6 (H) 8.9 - 10.3 mg/dL   GFR calc non Af Amer >60 >60 mL/min   GFR calc Af Amer >60 >60 mL/min    Comment: (NOTE) The eGFR has been calculated using the CKD EPI equation. This calculation has not been validated in all clinical situations. eGFR's persistently <60 mL/min signify possible Chronic Kidney Disease.    Anion gap 9 5 - 15    Comment: Performed at [facility], [address] [place], [place] [address]  CBC     Status: Abnormal   Collection Time: 08/12/17 10:58 PM  Result Value Ref Range   WBC 7.0 3.6 - 11.0 K/uL   RBC 5.30 (H) 3.80 - 5.20 MIL/uL   Hemoglobin 14.1 12.0 - 16.0 g/dL   HCT 43.6 35.0 - 47.0 %   MCV 82.3 80.0 - 100.0 fL   MCH 26.6 26.0 - 34.0 pg   MCHC 32.3 32.0 - 36.0 g/dL   RDW 14.1 11.5 - 14.5 %   Platelets 256 150 - 440 K/uL    Comment: Performed at [facility], [address] [place], [place] [address]  Troponin I     Status: None   Collection Time: 08/12/17 10:58 PM  Result Value Ref Range   Troponin  I <0.03 <0.03 ng/mL    Comment: Performed at [facility], [address] [place], [place] [address]  Troponin I     Status: None   Collection Time: 08/13/17  2:24 AM  Result Value Ref Range   Troponin I <0.03 <0.03 ng/mL    Comment: Performed at [facility], [address] [place], [place] [address]   GAD 7 : Generalized Anxiety Score 08/18/2017  Nervous, Anxious, on Edge 3  Control/stop worrying 2  Worry too much - different things 3  Trouble relaxing 2  Restless 2  Easily annoyed or irritable 1  Afraid - awful might happen 3  Total GAD 7 Score 16  Anxiety Difficulty Very difficult     PHQ2/9: Depression screen PHQ 2/9 08/18/2017  Decreased Interest 0  Down, Depressed, Hopeless 0  PHQ - 2 Score 0     Fall Risk: Fall Risk  08/18/2017  Falls in the past year? No     Functional Status Survey: Is the patient deaf or have difficulty hearing?: No Does the patient have difficulty seeing, even when wearing glasses/contacts?: No Does the patient have difficulty concentrating, remembering, or making decisions?: No Does the patient have difficulty walking or climbing stairs?: No Does the patient have difficulty dressing or bathing?: No Does the patient have difficulty doing errands alone such as visiting a doctor's office or shopping?: No    Assessment & Plan  1. Essential hypertension  bp was very high during EC visit, likely from panic attack, today without any medication is only slightly elevated, we will only resume HCTZ, stop Losartan and Norvasc ( since she has not taken it in over one year)  - hydrochlorothiazide (HYDRODIURIL) 12.5 MG tablet; Take 1 tablet (12.5 mg total) by mouth daily.  Dispense: 90 tablet; Refill: 0 - hydrALAZINE (APRESOLINE)  10 MG tablet; Take 1 tablet (10 mg total) by mouth 2 (two) times daily as needed. For bp above 150/90  Dispense: 60 tablet; Refill: 0 - Comprehensive metabolic panel - TSH  2. GAD  - busPIRone  (BUSPAR) 5 MG tablet; Take 1 tablet (5 mg total) by mouth 2 (two) times daily as needed.  Dispense: 60 tablet; Refill: 1 - escitalopram (LEXAPRO) 10 MG tablet; Take 1 tablet (10 mg total) by mouth daily.  Dispense: 30 tablet; Refill: 1  3. Gastroesophageal reflux disease without esophagitis  - ranitidine (ZANTAC) 300 MG tablet; Take 1 tablet (300 mg total) by mouth daily as needed for heartburn.  Dispense: 90 tablet; Refill: 0  4. Panic attack   5. Lipid screening  - Lipid panel  6. Encounter for screening colonoscopy  - Cologuard  7. Breast cancer screening  - MM Digital Screening; Future  8. Need for Tdap vaccination  - Tdap vaccine greater than or equal to 7yo IM  9. Needs flu shot  - Flu Vaccine QUAD 36+ mos IM  10. Encounter for screening for HIV  - HIV antibody  11. Hypercalcemia  - Hemoglobin A1c - PTH, Intact and Calcium  12. Diabetes mellitus screening  - Lipid panel  13. Tobacco use  Discussed importance of quitting and strategies to quit  14. Snoring  - Ambulatory referral to Sleep Studies  15. Enlarged LA (left atrium)  - ECHOCARDIOGRAM LIMITED; Future

## 2017-08-18 NOTE — Patient Instructions (Signed)
Steps to Quit Smoking Smoking tobacco can be bad for your health. It can also affect almost every organ in your body. Smoking puts you and people around you at risk for many serious long-lasting (chronic) diseases. Quitting smoking is hard, but it is one of the best things that you can do for your health. It is never too late to quit. What are the benefits of quitting smoking? When you quit smoking, you lower your risk for getting serious diseases and conditions. They can include:  Lung cancer or lung disease.  Heart disease.  Stroke.  Heart attack.  Not being able to have children (infertility).  Weak bones (osteoporosis) and broken bones (fractures).  If you have coughing, wheezing, and shortness of breath, those symptoms may get better when you quit. You may also get sick less often. If you are pregnant, quitting smoking can help to lower your chances of having a baby of low birth weight. What can I do to help me quit smoking? Talk with your doctor about what can help you quit smoking. Some things you can do (strategies) include:  Quitting smoking totally, instead of slowly cutting back how much you smoke over a period of time.  Going to in-person counseling. You are more likely to quit if you go to many counseling sessions.  Using resources and support systems, such as: ? Online chats with a counselor. ? Phone quitlines. ? Printed self-help materials. ? Support groups or group counseling. ? Text messaging programs. ? Mobile phone apps or applications.  Taking medicines. Some of these medicines may have nicotine in them. If you are pregnant or breastfeeding, do not take any medicines to quit smoking unless your doctor says it is okay. Talk with your doctor about counseling or other things that can help you.  Talk with your doctor about using more than one strategy at the same time, such as taking medicines while you are also going to in-person counseling. This can help make  quitting easier. What things can I do to make it easier to quit? Quitting smoking might feel very hard at first, but there is a lot that you can do to make it easier. Take these steps:  Talk to your family and friends. Ask them to support and encourage you.  Call phone quitlines, reach out to support groups, or work with a counselor.  Ask people who smoke to not smoke around you.  Avoid places that make you want (trigger) to smoke, such as: ? Bars. ? Parties. ? Smoke-break areas at work.  Spend time with people who do not smoke.  Lower the stress in your life. Stress can make you want to smoke. Try these things to help your stress: ? Getting regular exercise. ? Deep-breathing exercises. ? Yoga. ? Meditating. ? Doing a body scan. To do this, close your eyes, focus on one area of your body at a time from head to toe, and notice which parts of your body are tense. Try to relax the muscles in those areas.  Download or buy apps on your mobile phone or tablet that can help you stick to your quit plan. There are many free apps, such as QuitGuide from the CDC (Centers for Disease Control and Prevention). You can find more support from smokefree.gov and other websites.  This information is not intended to replace advice given to you by your health care provider. Make sure you discuss any questions you have with your health care provider. Document Released: 05/10/2009 Document   Revised: 03/11/2016 Document Reviewed: 11/28/2014 Elsevier Interactive Patient Education  2018 [employer]  

## 2017-08-20 LAB — LIPID PANEL
Chol/HDL Ratio: 4 ratio (ref 0.0–4.4)
Cholesterol, Total: 181 mg/dL (ref 100–199)
HDL: 45 mg/dL (ref >39)
LDL Calculated: 118 mg/dL — ABNORMAL HIGH (ref 0–99)
Triglycerides: 88 mg/dL (ref 0–149)
VLDL Cholesterol Cal: 18 mg/dL (ref 5–40)

## 2017-08-20 LAB — COMPREHENSIVE METABOLIC PANEL
ALT: 52 IU/L — ABNORMAL HIGH (ref 0–32)
AST: 31 IU/L (ref 0–40)
Albumin/Globulin Ratio: 2 (ref 1.2–2.2)
Albumin: 5.3 g/dL (ref 3.5–5.5)
Alkaline Phosphatase: 69 IU/L (ref 39–117)
BUN/Creatinine Ratio: 22 (ref 9–23)
BUN: 19 mg/dL (ref 6–24)
Bilirubin Total: 0.5 mg/dL (ref 0.0–1.2)
CO2: 24 mmol/L (ref 20–29)
Calcium: 10.4 mg/dL — ABNORMAL HIGH (ref 8.7–10.2)
Chloride: 101 mmol/L (ref 96–106)
Creatinine, Ser: 0.86 mg/dL (ref 0.57–1.00)
GFR calc Af Amer: 91 mL/min/{1.73_m2} (ref >59)
GFR calc non Af Amer: 79 mL/min/{1.73_m2} (ref >59)
Globulin, Total: 2.6 g/dL (ref 1.5–4.5)
Glucose: 104 mg/dL — ABNORMAL HIGH (ref 65–99)
Potassium: 4.5 mmol/L (ref 3.5–5.2)
Sodium: 141 mmol/L (ref 134–144)
Total Protein: 7.9 g/dL (ref 6.0–8.5)

## 2017-08-20 LAB — TSH: TSH: 2.57 u[IU]/mL (ref 0.450–4.500)

## 2017-08-20 LAB — PTH, INTACT AND CALCIUM: PTH: 34 pg/mL (ref 15–65)

## 2017-08-20 LAB — HEMOGLOBIN A1C
Est. average glucose Bld gHb Est-mCnc: 134 mg/dL
Hgb A1c MFr Bld: 6.3 % — ABNORMAL HIGH (ref 4.8–5.6)

## 2017-08-21 ENCOUNTER — Ambulatory Visit: Payer: Self-pay | Admitting: Internal Medicine

## 2017-08-25 ENCOUNTER — Encounter: Payer: Self-pay | Admitting: Family Medicine

## 2017-08-25 DIAGNOSIS — R739 Hyperglycemia, unspecified: Secondary | ICD-10-CM | POA: Insufficient documentation

## 2017-08-28 DIAGNOSIS — E782 Mixed hyperlipidemia: Secondary | ICD-10-CM | POA: Insufficient documentation

## 2017-09-09 ENCOUNTER — Ambulatory Visit
Admission: RE | Admit: 2017-09-09 | Discharge: 2017-09-09 | Disposition: A | Discharge status: Home / Self Care | Payer: Managed Care, Other (non HMO) | Source: Ambulatory Visit | Attending: Family Medicine | Admitting: Family Medicine

## 2017-09-09 ENCOUNTER — Other Ambulatory Visit: Payer: Self-pay | Admitting: Family Medicine

## 2017-09-09 DIAGNOSIS — Z1239 Encounter for other screening for malignant neoplasm of breast: Secondary | ICD-10-CM

## 2017-09-09 DIAGNOSIS — Z1231 Encounter for screening mammogram for malignant neoplasm of breast: Secondary | ICD-10-CM | POA: Insufficient documentation

## 2017-09-21 ENCOUNTER — Ambulatory Visit: Payer: Self-pay | Admitting: Internal Medicine

## 2017-09-22 ENCOUNTER — Encounter: Payer: Self-pay | Admitting: Family Medicine

## 2017-09-22 LAB — COLOGUARD: Cologuard: NEGATIVE

## 2017-09-23 DIAGNOSIS — R0602 Shortness of breath: Secondary | ICD-10-CM | POA: Insufficient documentation

## 2017-09-23 DIAGNOSIS — I119 Hypertensive heart disease without heart failure: Secondary | ICD-10-CM | POA: Insufficient documentation

## 2017-10-01 ENCOUNTER — Ambulatory Visit: Payer: Managed Care, Other (non HMO) | Admitting: Family Medicine

## 2017-10-07 ENCOUNTER — Ambulatory Visit: Payer: Managed Care, Other (non HMO) | Admitting: Family Medicine

## 2017-10-27 ENCOUNTER — Ambulatory Visit (INDEPENDENT_AMBULATORY_CARE_PROVIDER_SITE_OTHER): Payer: Managed Care, Other (non HMO) | Admitting: Family Medicine

## 2017-10-27 ENCOUNTER — Encounter: Payer: Self-pay | Admitting: Family Medicine

## 2017-10-27 VITALS — BP 180/110 | HR 90 | Resp 16 | Ht 61.0 in | Wt 184.8 lb

## 2017-10-27 DIAGNOSIS — R739 Hyperglycemia, unspecified: Secondary | ICD-10-CM | POA: Diagnosis not present

## 2017-10-27 DIAGNOSIS — F411 Generalized anxiety disorder: Secondary | ICD-10-CM

## 2017-10-27 DIAGNOSIS — E782 Mixed hyperlipidemia: Secondary | ICD-10-CM

## 2017-10-27 DIAGNOSIS — R7303 Prediabetes: Secondary | ICD-10-CM | POA: Diagnosis not present

## 2017-10-27 DIAGNOSIS — Z720 Tobacco use: Secondary | ICD-10-CM

## 2017-10-27 DIAGNOSIS — I119 Hypertensive heart disease without heart failure: Secondary | ICD-10-CM

## 2017-10-27 DIAGNOSIS — I10 Essential (primary) hypertension: Secondary | ICD-10-CM

## 2017-10-27 MED ORDER — AMLODIPINE BESYLATE 5 MG PO TABS: 5.0000 mg | ORAL_TABLET | Freq: Every day | ORAL | 1 refills | Status: DC

## 2017-10-27 MED ORDER — HYDROCHLOROTHIAZIDE 12.5 MG PO TABS: 12.5000 mg | ORAL_TABLET | Freq: Every day | ORAL | 0 refills | Status: DC

## 2017-10-27 MED ORDER — SEMAGLUTIDE(0.25 OR 0.5MG/DOS) 2 MG/1.5ML ~~LOC~~ SOPN: 0.5000 mg | PEN_INJECTOR | SUBCUTANEOUS | 2 refills | Status: DC

## 2017-10-27 MED ORDER — VARENICLINE TARTRATE 1 MG PO TABS: 1.0000 mg | ORAL_TABLET | Freq: Two times a day (BID) | ORAL | 2 refills | Status: DC

## 2017-10-27 MED ORDER — BUSPIRONE HCL 5 MG PO TABS: 5.0000 mg | ORAL_TABLET | Freq: Every evening | ORAL | 0 refills | Status: DC

## 2017-10-27 MED ORDER — VARENICLINE TARTRATE 0.5 MG X 11 & 1 MG X 42 PO MISC: ORAL | 0 refills | Status: DC

## 2017-10-27 MED ORDER — ESCITALOPRAM OXALATE 10 MG PO TABS: 10.0000 mg | ORAL_TABLET | Freq: Every day | ORAL | 0 refills | Status: DC

## 2017-10-27 NOTE — Progress Notes (Signed)
Name: [name]   MRN: [id number]    DOB: 1966/03/30   Date:10/27/2017       Progress Note  Subjective  Chief Complaint  Chief Complaint  Patient presents with  . Hypertension  . Results  . Obesity    HPI   HTN: she was diagnosed many years ago and [facility], however last visit was 2018. She went to [facility] this past week with very high bp and was sent here for follow up. She states bp goes up when nervous or during a panic attack, she was taking losartan/hctz and norvasc years ago but stopped on her own because of it caused headaches. She was seen by Dr. Nehemiah Massed and advised to go back on Norvasc, but she did not fill rx. She will try it again, but will call back to switch to Bystolic if she develops a headache.   LVH: due to hypertension, also has snoring, we ordered sleep study in January but she did not hear about appointment. We will verify for her today. No PND or orthopnea , no leg edema  Metabolic syndrome: BRAX0N 6.2%, fasting glucose elevated for over one year.  She has polydipsia, she also has polyuria and episodes of polyphagia. We discussed options and she is willing to try Ozempic ( off label indication), discussed possible side effects with patient today.   Hypercalcemia:  with symptoms of polydipsia and reflux ,parathyroid hormone was normal, likely from diuretics but we will monitor for now  GERD: she is off Omeprazole and is taking Ranitidine prn only now.   Tobacco use: smokes half pack daily, willing to quit, she denies cough , SOB or wheezing. She picked April 16 th as quitting date, she will start walking with a friend to replace the habit, she will start Chantix next Monday - since she will start Ozempic today   GAD/Panic attacks: she states developed anxiety when her son was incarcerated over 7 years ago for dealing drugs. He is already out prison and doing well, but she was very fearful still on her last visit 07/2017. She states  Buspar has helped, she takes it the pm at work and has been able to go home alone and start cooking before husband gets home. No side effects of new medication. Also taking Lexapro.    Patient Active Problem List   Diagnosis Date Noted  . LVH (left ventricular hypertrophy) due to hypertensive disease, without heart failure 09/23/2017  . SOBOE (shortness of breath on exertion) 09/23/2017  . Hyperlipidemia, mixed 08/28/2017  . Hyperglycemia 08/25/2017  . Hypercalcemia due to thiazide and vitamin A 08/25/2017  . Hypertension 08/18/2017  . GAD (generalized anxiety disorder) 08/18/2017  . GERD (gastroesophageal reflux disease) 08/18/2017  . Panic attack 08/18/2017  . Tobacco use 08/18/2017  . Snoring 08/18/2017  . Systolic ejection murmur 40/76/8088    Past Surgical History:  Procedure Laterality Date  . ABDOMINAL HYSTERECTOMY     Complete Hysterectomy  . CHOLECYSTECTOMY      Family History  Problem Relation Age of Onset  . Hypertension Mother   . Stroke Father   . Hypertension Father   . Hypertension Sister   . Breast cancer Neg Hx     Social History   Socioeconomic History  . Marital status: Married    Spouse name: [name]   . Number of children: 3  . Years of education: Not on file  . Highest education level: 12th grade  Occupational History  .  Occupation: Furniture conservator/restorer   Social Needs  . Financial resource strain: Not hard at all  . Food insecurity:    Worry: Never true    Inability: Never true  . Transportation needs:    Medical: No    Non-medical: No  Tobacco Use  . Smoking status: Current Every Day Smoker    Packs/day: 0.50    Years: 18.00    Pack years: 9.00    Types: Cigarettes    Start date: 08/19/1999  . Smokeless tobacco: Never Used  Substance and Sexual Activity  . Alcohol use: Yes    Alcohol/week: 0.6 oz    Types: 1 Glasses of wine per week  . Drug use: No  . Sexual activity: Yes    Partners: Male    Birth control/protection: Other-see  comments    Comment: Hysterectomy  Lifestyle  . Physical activity:    Days per week: 5 days    Minutes per session: 20 min  . Stress: Rather much  Relationships  . Social connections:    Talks on phone: More than three times a week    Gets together: Twice a week    Attends religious service: More than 4 times per year    Active member of club or organization: Yes    Attends meetings of clubs or organizations: More than 4 times per year    Relationship status: Married  . Intimate partner violence:    Fear of current or ex partner: No    Emotionally abused: No    Physically abused: No    Forced sexual activity: No  Other Topics Concern  . Not on file  Social History Narrative   Re-married since 2012   Only the two of them at home   She has three children from previous relationship and he has one   She works a full at [employer] and part time at [employer]    Her only son ( youngest child) went to prison for 3 years ( from 2014 - 2017), since than she has been anxious and scared of darkness.      Current Outpatient Medications:  .  amLODipine (NORVASC) 5 MG tablet, Take 1 tablet by mouth daily., Disp: , Rfl:  .  aspirin EC 81 MG tablet, Take 162 mg by mouth daily., Disp: , Rfl:  .  busPIRone (BUSPAR) 5 MG tablet, Take 1 tablet (5 mg total) by mouth 2 (two) times daily as needed., Disp: 60 tablet, Rfl: 1 .  escitalopram (LEXAPRO) 10 MG tablet, Take 1 tablet (10 mg total) by mouth daily., Disp: 30 tablet, Rfl: 1 .  hydrALAZINE (APRESOLINE) 10 MG tablet, Take 1 tablet (10 mg total) by mouth 2 (two) times daily as needed. For bp above 150/90, Disp: 60 tablet, Rfl: 0 .  hydrochlorothiazide (HYDRODIURIL) 12.5 MG tablet, Take 1 tablet (12.5 mg total) by mouth daily., Disp: 90 tablet, Rfl: 0 .  ranitidine (ZANTAC) 300 MG tablet, Take 1 tablet (300 mg total) by mouth daily as needed for heartburn., Disp: 90 tablet, Rfl: 0  Allergies  Allergen Reactions  . Penicillins  Itching    Has patient had a PCN reaction causing immediate rash, facial/tongue/throat swelling, SOB or lightheadedness with hypotension: No Has patient had a PCN reaction causing severe rash involving mucus membranes or skin necrosis: No Has patient had a PCN reaction that required hospitalization: No Has patient had a PCN reaction occurring within the last 10 years: No If all of the above  answers are "NO", then may proceed with Cephalosporin use.      ROS  Constitutional: Negative for fever or weight change.  Respiratory: Negative for cough and shortness of breath.   Cardiovascular: Negative for chest pain or palpitations.  Gastrointestinal: Negative for abdominal pain, no bowel changes.  Musculoskeletal: Negative for gait problem or joint swelling.  Skin: Negative for rash.  Neurological: Negative for dizziness or headache.  No other specific complaints in a complete review of systems (except as listed in HPI above).  Objective  Vitals:   10/27/17 0813  BP: (!) 170/90  Pulse: 90  Resp: 16  SpO2: 98%  Weight: 184 lb 12.8 oz (83.8 kg)  Height: '5\' 1"'  (1.549 m)    Body mass index is 34.92 kg/m.  Physical Exam  Constitutional: Patient appears well-developed and well-nourished. Obese No distress.  HEENT: head atraumatic, normocephalic, pupils equal and reactive to light, , neck supple, throat within normal limits Cardiovascular: Normal rate, regular rhythm and normal heart sounds.  Very faint  murmur heard. No BLE edema. Pulmonary/Chest: Effort normal and breath sounds normal. No respiratory distress. Abdominal: Soft.  There is no tenderness. Psychiatric: Patient has a normal mood and affect. behavior is normal. Judgment and thought content normal.  Recent Results (from the past 2160 hour(s))  Basic metabolic panel     Status: Abnormal   Collection Time: 08/12/17 10:58 PM  Result Value Ref Range   Sodium 140 135 - 145 mmol/L   Potassium 4.0 3.5 - 5.1 mmol/L   Chloride  103 101 - 111 mmol/L   CO2 28 22 - 32 mmol/L   Glucose, Bld 103 (H) 65 - 99 mg/dL   BUN 19 6 - 20 mg/dL   Creatinine, Ser 0.90 0.44 - 1.00 mg/dL   Calcium 10.6 (H) 8.9 - 10.3 mg/dL   GFR calc non Af Amer >60 >60 mL/min   GFR calc Af Amer >60 >60 mL/min    Comment: (NOTE) The eGFR has been calculated using the CKD EPI equation. This calculation has not been validated in all clinical situations. eGFR's persistently <60 mL/min signify possible Chronic Kidney Disease.    Anion gap 9 5 - 15    Comment: Performed at [facility], [address] [place], [place] [address]  CBC     Status: Abnormal   Collection Time: 08/12/17 10:58 PM  Result Value Ref Range   WBC 7.0 3.6 - 11.0 K/uL   RBC 5.30 (H) 3.80 - 5.20 MIL/uL   Hemoglobin 14.1 12.0 - 16.0 g/dL   HCT 43.6 35.0 - 47.0 %   MCV 82.3 80.0 - 100.0 fL   MCH 26.6 26.0 - 34.0 pg   MCHC 32.3 32.0 - 36.0 g/dL   RDW 14.1 11.5 - 14.5 %   Platelets 256 150 - 440 K/uL    Comment: Performed at [facility], [address] [place], [place] [address]  Troponin I     Status: None   Collection Time: 08/12/17 10:58 PM  Result Value Ref Range   Troponin I <0.03 <0.03 ng/mL    Comment: Performed at [facility], [address] [place], [place] [address]  Troponin I     Status: None   Collection Time: 08/13/17  2:24 AM  Result Value Ref Range   Troponin I <0.03 <0.03 ng/mL    Comment: Performed at [facility], [address] [place], [place] [address]  Comprehensive metabolic panel     Status: Abnormal  Collection Time: 08/19/17  8:05 AM  Result Value Ref Range   Glucose 104 (H) 65 - 99 mg/dL   BUN 19 6 - 24 mg/dL   Creatinine, Ser 0.86 0.57 - 1.00 mg/dL   GFR calc non Af Amer 79 >59 mL/min/1.73   GFR calc Af Amer 91 >59 mL/min/1.73   BUN/Creatinine Ratio 22 9 - 23   Sodium 141 134 - 144 mmol/L   Potassium 4.5 3.5 - 5.2 mmol/L   Chloride 101 96 - 106 mmol/L   CO2 24 20 - 29 mmol/L   Calcium  10.4 (H) 8.7 - 10.2 mg/dL   Total Protein 7.9 6.0 - 8.5 g/dL   Albumin 5.3 3.5 - 5.5 g/dL   Globulin, Total 2.6 1.5 - 4.5 g/dL   Albumin/Globulin Ratio 2.0 1.2 - 2.2   Bilirubin Total 0.5 0.0 - 1.2 mg/dL   Alkaline Phosphatase 69 39 - 117 IU/L   AST 31 0 - 40 IU/L   ALT 52 (H) 0 - 32 IU/L  Hemoglobin A1c     Status: Abnormal   Collection Time: 08/19/17  8:05 AM  Result Value Ref Range   Hgb A1c MFr Bld 6.3 (H) 4.8 - 5.6 %    Comment:          Prediabetes: 5.7 - 6.4          Diabetes: >6.4          Glycemic control for adults with diabetes: <7.0    Est. average glucose Bld gHb Est-mCnc 134 mg/dL  TSH     Status: None   Collection Time: 08/19/17  8:05 AM  Result Value Ref Range   TSH 2.570 0.450 - 4.500 uIU/mL  PTH, Intact and Calcium     Status: None   Collection Time: 08/19/17  8:05 AM  Result Value Ref Range   PTH 34 15 - 65 pg/mL   PTH Interp Comment     Comment: Interpretation                 Intact PTH    Calcium                                 (pg/mL)      (mg/dL) Normal                          15 - 65     8.6 - 10.2 Primary Hyperparathyroidism         >65          >10.2 Secondary Hyperparathyroidism       >65          <10.2 Non-Parathyroid Hypercalcemia       <65          >10.2 Hypoparathyroidism                  <15          < 8.6 Non-Parathyroid Hypocalcemia    15 - 65          < 8.6   Lipid panel     Status: Abnormal   Collection Time: 08/19/17  8:05 AM  Result Value Ref Range   Cholesterol, Total 181 100 - 199 mg/dL   Triglycerides 88 0 - 149 mg/dL   HDL 45 >39 mg/dL   VLDL Cholesterol Cal 18 5 - 40 mg/dL   LDL  Calculated 118 (H) 0 - 99 mg/dL   Chol/HDL Ratio 4.0 0.0 - 4.4 ratio    Comment:                                   T. Chol/HDL Ratio                                             Men  Women                               1/2 Avg.Risk  3.4    3.3                                   Avg.Risk  5.0    4.4                                2X Avg.Risk  9.6     7.1                                3X Avg.Risk 23.4   11.0   Cologuard     Status: None   Collection Time: 09/13/17 12:00 AM  Result Value Ref Range   Cologuard Negative     Comment: Repeat in 3 years      PHQ2/9: Depression screen PHQ 2/9 08/18/2017  Decreased Interest 0  Down, Depressed, Hopeless 0  PHQ - 2 Score 0     Fall Risk: Fall Risk  10/27/2017 08/18/2017  Falls in the past year? No No     Functional Status Survey: Is the patient deaf or have difficulty hearing?: No Does the patient have difficulty seeing, even when wearing glasses/contacts?: No Does the patient have difficulty concentrating, remembering, or making decisions?: No Does the patient have difficulty walking or climbing stairs?: No Does the patient have difficulty dressing or bathing?: No Does the patient have difficulty doing errands alone such as visiting a doctor's office or shopping?: No   Assessment & Plan  1. LVH (left ventricular hypertrophy) due to hypertensive disease, without heart failure  Seen by Dr. Nehemiah Massed, but never got rx of Norvasc, bp is high, we will start it today, no symptoms of CHF  2. Tobacco use  Discussed possible side effects of medication  - varenicline (CHANTIX STARTING MONTH PAK) 0.5 MG X 11 & 1 MG X 42 tablet; Take one 0.5 mg tablet by mouth once daily for 3 days, then increase to one 0.5 mg tablet twice daily for 4 days, then increase to one 1 mg tablet twice daily.  Dispense: 53 tablet; Refill: 0 - varenicline (CHANTIX CONTINUING MONTH PAK) 1 MG tablet; Take 1 tablet (1 mg total) by mouth 2 (two) times daily.  Dispense: 60 tablet; Refill: 2  3. Hyperglycemia  Discussed lab results, hgbA1C up to 6.2%. We will start ozempic, patient aware of possible side effects, no personal history of pancreatitis or family history of thyroid cancer  4. Hyperlipidemia, mixed  We will monitor for now  5. GAD (generalized anxiety disorder)  Doing better, no  longer afraid of going  home alone at night - escitalopram (LEXAPRO) 10 MG tablet; Take 1 tablet (10 mg total) by mouth daily.  Dispense: 90 tablet; Refill: 0 - busPIRone (BUSPAR) 5 MG tablet; Take 1 tablet (5 mg total) by mouth every evening.  Dispense: 90 tablet; Refill: 0  6. Uncontrolled hypertension  - amLODipine (NORVASC) 5 MG tablet; Take 1 tablet (5 mg total) by mouth daily.  Dispense: 30 tablet; Refill: 1 - hydrochlorothiazide (HYDRODIURIL) 12.5 MG tablet; Take 1 tablet (12.5 mg total) by mouth daily.  Dispense: 90 tablet; Refill: 0  7. Pre-diabetes  - Semaglutide (OZEMPIC) 0.25 or 0.5 MG/DOSE SOPN; Inject 0.5 mg into the skin once a week.  Dispense: 2 pen; Refill: 2  8. Essential hypertension  - amLODipine (NORVASC) 5 MG tablet; Take 1 tablet (5 mg total) by mouth daily.  Dispense: 30 tablet; Refill: 1 - hydrochlorothiazide (HYDRODIURIL) 12.5 MG tablet; Take 1 tablet (12.5 mg total) by mouth daily.  Dispense: 90 tablet; Refill: 0

## 2017-10-27 NOTE — Patient Instructions (Signed)
Start Norvasc ( amlodipine) 5 mg daily,  Take Hydralazine only as needed if bp above 150/90 Continue HCTZ  Call back if headache and we can switch to [employer] MediaBystolic You can also try taking only half pill of amlodipine for the first few days.

## 2017-10-28 ENCOUNTER — Encounter: Payer: Self-pay | Admitting: Family Medicine

## 2017-10-29 ENCOUNTER — Other Ambulatory Visit: Payer: Self-pay | Admitting: Family Medicine

## 2017-10-29 ENCOUNTER — Telehealth: Payer: Self-pay | Admitting: Family Medicine

## 2017-10-29 NOTE — Telephone Encounter (Signed)
Copied from CRM [phone]. Topic: Quick Communication - See Telephone Encounter >> Oct 29, 2017 11:02 AM Oneal GroutSebastian, Jennifer S wrote: CRM for notification. See Telephone encounter for: 10/29/17. Need DX code for chantix PA, Ref # [id number]

## 2017-10-29 NOTE — Telephone Encounter (Signed)
Prior Auth was done online via CoverMyMeds and was denied. They want her to try Nicotine gum, lozenges or transdermal patch first before they will approve the Chantix.  Dr. Carlynn PurlSowles has been informed.

## 2017-10-29 NOTE — Telephone Encounter (Signed)
What does she prefer?

## 2017-10-30 ENCOUNTER — Other Ambulatory Visit: Payer: Self-pay | Admitting: Family Medicine

## 2017-10-30 MED ORDER — NICOTINE 14 MG/24HR TD PT24: 14.0000 mg | MEDICATED_PATCH | Freq: Every day | TRANSDERMAL | 0 refills | Status: DC

## 2017-10-30 MED ORDER — METFORMIN HCL 850 MG PO TABS: 850.0000 mg | ORAL_TABLET | Freq: Two times a day (BID) | ORAL | 0 refills | Status: DC

## 2017-10-30 NOTE — Telephone Encounter (Signed)
done

## 2017-10-30 NOTE — Telephone Encounter (Signed)
Patient would prefer the Nicotine Patches and also was notified about the Ozempic being Denied. Patient states she would try Metformin please send into [facility] [address]

## 2017-11-19 ENCOUNTER — Encounter: Payer: Managed Care, Other (non HMO) | Admitting: Family Medicine

## 2017-12-09 ENCOUNTER — Encounter: Payer: Self-pay | Admitting: Family Medicine

## 2017-12-09 ENCOUNTER — Ambulatory Visit (INDEPENDENT_AMBULATORY_CARE_PROVIDER_SITE_OTHER): Payer: Managed Care, Other (non HMO) | Admitting: Family Medicine

## 2017-12-09 VITALS — BP 190/100 | HR 88 | Temp 98.2°F | Resp 16 | Ht 61.5 in | Wt 183.8 lb

## 2017-12-09 DIAGNOSIS — Z01419 Encounter for gynecological examination (general) (routine) without abnormal findings: Secondary | ICD-10-CM | POA: Diagnosis not present

## 2017-12-09 DIAGNOSIS — Z720 Tobacco use: Secondary | ICD-10-CM

## 2017-12-09 DIAGNOSIS — I119 Hypertensive heart disease without heart failure: Secondary | ICD-10-CM | POA: Diagnosis not present

## 2017-12-09 DIAGNOSIS — N3941 Urge incontinence: Secondary | ICD-10-CM | POA: Diagnosis not present

## 2017-12-09 DIAGNOSIS — R7303 Prediabetes: Secondary | ICD-10-CM

## 2017-12-09 DIAGNOSIS — I10 Essential (primary) hypertension: Secondary | ICD-10-CM

## 2017-12-09 DIAGNOSIS — Z6834 Body mass index (BMI) 34.0-34.9, adult: Secondary | ICD-10-CM

## 2017-12-09 DIAGNOSIS — Z23 Encounter for immunization: Secondary | ICD-10-CM

## 2017-12-09 DIAGNOSIS — E7800 Pure hypercholesterolemia, unspecified: Secondary | ICD-10-CM

## 2017-12-09 MED ORDER — SOLIFENACIN SUCCINATE 10 MG PO TABS: 10.0000 mg | ORAL_TABLET | Freq: Every day | ORAL | 0 refills | Status: DC

## 2017-12-09 MED ORDER — ZOSTER VAC RECOMB ADJUVANTED 50 MCG/0.5ML IM SUSR: 0.5000 mL | Freq: Once | INTRAMUSCULAR | 1 refills | Status: AC

## 2017-12-09 MED ORDER — VARENICLINE TARTRATE 1 MG PO TABS
1.0000 mg | ORAL_TABLET | Freq: Two times a day (BID) | ORAL | 2 refills | Status: DC
Start: 2017-12-09 — End: 2018-02-22

## 2017-12-09 MED ORDER — ATORVASTATIN CALCIUM 20 MG PO TABS: 20.0000 mg | ORAL_TABLET | Freq: Every day | ORAL | 1 refills | Status: DC

## 2017-12-09 MED ORDER — VARENICLINE TARTRATE 0.5 MG X 11 & 1 MG X 42 PO MISC: ORAL | 0 refills | Status: DC

## 2017-12-09 MED ORDER — SEMAGLUTIDE (1 MG/DOSE) 2 MG/1.5ML ~~LOC~~ SOPN: 1.0000 mg | PEN_INJECTOR | SUBCUTANEOUS | 1 refills | Status: DC

## 2017-12-09 NOTE — Patient Instructions (Signed)
Preventive Care 40-64 Years, Female Preventive care refers to lifestyle choices and visits with your health care provider that can promote health and wellness. What does preventive care include?  A yearly physical exam. This is also called an annual well check.  Dental exams once or twice a year.  Routine eye exams. Ask your health care provider how often you should have your eyes checked.  Personal lifestyle choices, including: ? Daily care of your teeth and gums. ? Regular physical activity. ? Eating a healthy diet. ? Avoiding tobacco and drug use. ? Limiting alcohol use. ? Practicing safe sex. ? Taking low-dose aspirin daily starting at age 58. ? Taking vitamin and mineral supplements as recommended by your health care provider. What happens during an annual well check? The services and screenings done by your health care provider during your annual well check will depend on your age, overall health, lifestyle risk factors, and family history of disease. Counseling Your health care provider may ask you questions about your:  Alcohol use.  Tobacco use.  Drug use.  Emotional well-being.  Home and relationship well-being.  Sexual activity.  Eating habits.  Work and work Statistician.  Method of birth control.  Menstrual cycle.  Pregnancy history.  Screening You may have the following tests or measurements:  Height, weight, and BMI.  Blood pressure.  Lipid and cholesterol levels. These may be checked every 5 years, or more frequently if you are over 81 years old.  Skin check.  Lung cancer screening. You may have this screening every year starting at age 78 if you have a 30-pack-year history of smoking and currently smoke or have quit within the past 15 years.  Fecal occult blood test (FOBT) of the stool. You may have this test every year starting at age 65.  Flexible sigmoidoscopy or colonoscopy. You may have a sigmoidoscopy every 5 years or a colonoscopy  every 10 years starting at age 30.  Hepatitis C blood test.  Hepatitis B blood test.  Sexually transmitted disease (STD) testing.  Diabetes screening. This is done by checking your blood sugar (glucose) after you have not eaten for a while (fasting). You may have this done every 1-3 years.  Mammogram. This may be done every 1-2 years. Talk to your health care provider about when you should start having regular mammograms. This may depend on whether you have a family history of breast cancer.  BRCA-related cancer screening. This may be done if you have a family history of breast, ovarian, tubal, or peritoneal cancers.  Pelvic exam and Pap test. This may be done every 3 years starting at age 80. Starting at age 36, this may be done every 5 years if you have a Pap test in combination with an HPV test.  Bone density scan. This is done to screen for osteoporosis. You may have this scan if you are at high risk for osteoporosis.  Discuss your test results, treatment options, and if necessary, the need for more tests with your health care provider. Vaccines Your health care provider may recommend certain vaccines, such as:  Influenza vaccine. This is recommended every year.  Tetanus, diphtheria, and acellular pertussis (Tdap, Td) vaccine. You may need a Td booster every 10 years.  Varicella vaccine. You may need this if you have not been vaccinated.  Zoster vaccine. You may need this after age 5.  Measles, mumps, and rubella (MMR) vaccine. You may need at least one dose of MMR if you were born in  1957 or later. You may also need a second dose.  Pneumococcal 13-valent conjugate (PCV13) vaccine. You may need this if you have certain conditions and were not previously vaccinated.  Pneumococcal polysaccharide (PPSV23) vaccine. You may need one or two doses if you smoke cigarettes or if you have certain conditions.  Meningococcal vaccine. You may need this if you have certain  conditions.  Hepatitis A vaccine. You may need this if you have certain conditions or if you travel or work in places where you may be exposed to hepatitis A.  Hepatitis B vaccine. You may need this if you have certain conditions or if you travel or work in places where you may be exposed to hepatitis B.  Haemophilus influenzae type b (Hib) vaccine. You may need this if you have certain conditions.  Talk to your health care provider about which screenings and vaccines you need and how often you need them. This information is not intended to replace advice given to you by your health care provider. Make sure you discuss any questions you have with your health care provider. Document Released: 08/10/2015 Document Revised: 04/02/2016 Document Reviewed: 05/15/2015 Elsevier Interactive Patient Education  2018 [employer]  

## 2017-12-09 NOTE — Progress Notes (Signed)
Name: [name]   MRN: [id number]    DOB: Feb 19, 1966   Date:12/09/2017       Progress Note  Subjective  Chief Complaint  Chief Complaint  Patient presents with  . Annual Exam  . Follow-up    HPI   Patient presents for annual CPE and follow up  Urge incontinence: she states that for the past year, she has noticed urinary urgency, no stress incontinence, not wearing pads, but would to try medication  Tobacco cessation: she has tried nicotine patch, but unable to quit, would like to try Chantix now  HTN: she has not been taking Norvasc 5 mg yet, bp still not at goal, explained to take it daily with HCTZ and return in one week with all medication and bp check with nurse. She denies chest pain or palpitation. She has LVH and explained importance of bp control. She did not get appointment for sleep study yet  GAD: only taking Buspar, did not like lexapro - it made her feel sleepy.   Obesity: she tried ozempic for pre diabetes and obesity, but used too much medication to prime it and ran out of medication. She states it curbed her appetite, no side effects of medication, discussed to increase physical activity and decrease portion size of meals.   USPSTF grade A and B recommendations  Depression:  Depression screen [facility] 2/9 12/09/2017 08/18/2017  Decreased Interest 0 0  Down, Depressed, Hopeless 0 0  PHQ - 2 Score 0 0   Hypertension: BP Readings from Last 3 Encounters:  12/09/17 (!) 190/100  10/27/17 (!) 180/110  08/18/17 138/84   Obesity: Wt Readings from Last 3 Encounters:  12/09/17 183 lb 12.8 oz (83.4 kg)  10/27/17 184 lb 12.8 oz (83.8 kg)  08/18/17 183 lb 4.8 oz (83.1 kg)   BMI Readings from Last 3 Encounters:  12/09/17 34.17 kg/m  10/27/17 34.92 kg/m  08/18/17 34.63 kg/m    Alcohol: counseling given, she drinks only on weekends and does not binge drink  Tobacco use: trying to quit  HIV, hep B, hep C: N/A STD testing and prevention (chl/gon/syphilis):  N/A Intimate partner violence: negative Sexual History/Pain during Intercourse: no pain  Menstrual History/LMP/Abnormal Bleeding: s/p hysterectomy Incontinence Symptoms: she has urinary urgency, history of bladder tack  Advanced Care Planning: A voluntary discussion about advance care planning including the explanation and discussion of advance directives.  Discussed health care proxy and Living will, and the patient was able to identify a health care proxy as husband   Patient does not have a living will at present time.  Breast cancer:  Up to date BRCA gene screening: N/A Cervical cancer screening: not a candidate   Osteoporosis: discussed high vitamin D diet Lipids:  Lab Results  Component Value Date   CHOL 181 08/19/2017   Lab Results  Component Value Date   HDL 45 08/19/2017   Lab Results  Component Value Date   LDLCALC 118 (H) 08/19/2017   Lab Results  Component Value Date   TRIG 88 08/19/2017   Lab Results  Component Value Date   CHOLHDL 4.0 08/19/2017   No results found for: LDLDIRECT  Glucose:  Glucose  Date Value Ref Range Status  08/19/2017 104 (H) 65 - 99 mg/dL Final  03/21/2013 117 (H) 65 - 99 mg/dL Final   Glucose, Bld  Date Value Ref Range Status  08/12/2017 103 (H) 65 - 99 mg/dL Final  03/24/2017 121 (H) 65 - 99 mg/dL Final  04/04/2016  103 (H) 65 - 99 mg/dL Final    Colorectal cancer screen: 50years old. Lung cancer screen :  Low Dose CT Chest recommended if Age 48-80 years, 30 pack-year currently smoking OR have quit w/in 15years. Patient does not qualify.   Aspirin: yes ECG: done by Dr. Nehemiah Massed   Patient Active Problem List   Diagnosis Date Noted  . LVH (left ventricular hypertrophy) due to hypertensive disease, without heart failure 09/23/2017  . SOBOE (shortness of breath on exertion) 09/23/2017  . Hyperlipidemia, mixed 08/28/2017  . Hyperglycemia 08/25/2017  . Hypercalcemia due to thiazide and vitamin A 08/25/2017  . Hypertension  08/18/2017  . GAD (generalized anxiety disorder) 08/18/2017  . GERD (gastroesophageal reflux disease) 08/18/2017  . Panic attack 08/18/2017  . Tobacco use 08/18/2017  . Snoring 08/18/2017  . Systolic ejection murmur 16/04/9603    Past Surgical History:  Procedure Laterality Date  . ABDOMINAL HYSTERECTOMY     Complete Hysterectomy  . CHOLECYSTECTOMY      Family History  Problem Relation Age of Onset  . Hypertension Mother   . Stroke Father   . Hypertension Father   . Hypertension Sister   . Breast cancer Neg Hx     Social History   Socioeconomic History  . Marital status: Married    Spouse name: [name]   . Number of children: 3  . Years of education: Not on file  . Highest education level: 12th grade  Occupational History  . Occupation: Furniture conservator/restorer   Social Needs  . Financial resource strain: Not hard at all  . Food insecurity:    Worry: Never true    Inability: Never true  . Transportation needs:    Medical: No    Non-medical: No  Tobacco Use  . Smoking status: Current Every Day Smoker    Packs/day: 0.50    Years: 18.00    Pack years: 9.00    Types: Cigarettes    Start date: 08/19/1999  . Smokeless tobacco: Never Used  Substance and Sexual Activity  . Alcohol use: Yes    Alcohol/week: 0.6 oz    Types: 1 Glasses of wine per week  . Drug use: No  . Sexual activity: Yes    Partners: Male    Birth control/protection: Other-see comments    Comment: Hysterectomy  Lifestyle  . Physical activity:    Days per week: 5 days    Minutes per session: 20 min  . Stress: Rather much  Relationships  . Social connections:    Talks on phone: More than three times a week    Gets together: Twice a week    Attends religious service: More than 4 times per year    Active member of club or organization: Yes    Attends meetings of clubs or organizations: More than 4 times per year    Relationship status: Married  . Intimate partner violence:    Fear of current or ex  partner: No    Emotionally abused: No    Physically abused: No    Forced sexual activity: No  Other Topics Concern  . Not on file  Social History Narrative   Re-married since 2012   Only the two of them at home   She has three children from previous relationship and he has one   She works a full at [employer] and part time at [employer]    Her only son ( youngest child) went to prison for 3 years ( from  2014 - 2017), since than she has been anxious and scared of darkness.      Current Outpatient Medications:  .  amLODipine (NORVASC) 5 MG tablet, Take 1 tablet (5 mg total) by mouth daily., Disp: 30 tablet, Rfl: 1 .  aspirin EC 81 MG tablet, Take 162 mg by mouth daily., Disp: , Rfl:  .  atorvastatin (LIPITOR) 20 MG tablet, Take 1 tablet (20 mg total) by mouth daily., Disp: 90 tablet, Rfl: 1 .  busPIRone (BUSPAR) 5 MG tablet, Take 1 tablet (5 mg total) by mouth every evening., Disp: 90 tablet, Rfl: 0 .  hydrALAZINE (APRESOLINE) 10 MG tablet, Take 1 tablet (10 mg total) by mouth 2 (two) times daily as needed. For bp above 150/90, Disp: 60 tablet, Rfl: 0 .  hydrochlorothiazide (HYDRODIURIL) 12.5 MG tablet, Take 1 tablet (12.5 mg total) by mouth daily., Disp: 90 tablet, Rfl: 0 .  ranitidine (ZANTAC) 300 MG tablet, Take 1 tablet (300 mg total) by mouth daily as needed for heartburn., Disp: 90 tablet, Rfl: 0 .  Semaglutide (OZEMPIC) 1 MG/DOSE SOPN, Inject 1 mg into the skin once a week., Disp: 27 mL, Rfl: 1 .  varenicline (CHANTIX CONTINUING MONTH PAK) 1 MG tablet, Take 1 tablet (1 mg total) by mouth 2 (two) times daily., Disp: 60 tablet, Rfl: 2 .  varenicline (CHANTIX STARTING MONTH PAK) 0.5 MG X 11 & 1 MG X 42 tablet, Take one 0.5 mg tablet by mouth once daily for 3 days, then increase to one 0.5 mg tablet twice daily for 4 days, then increase to one 1 mg tablet twice daily., Disp: 53 tablet, Rfl: 0 .  Zoster Vaccine Adjuvanted (SHINGRIX) injection, Inject 0.5 mLs into the muscle  once for 1 dose., Disp: 0.5 mL, Rfl: 1  Allergies  Allergen Reactions  . Penicillins Itching    Has patient had a PCN reaction causing immediate rash, facial/tongue/throat swelling, SOB or lightheadedness with hypotension: No Has patient had a PCN reaction causing severe rash involving mucus membranes or skin necrosis: No Has patient had a PCN reaction that required hospitalization: No Has patient had a PCN reaction occurring within the last 10 years: No If all of the above answers are "NO", then may proceed with Cephalosporin use.      ROS   Constitutional: Negative for fever or weight change.  Respiratory: Negative for cough and shortness of breath.   Cardiovascular: Negative for chest pain or palpitations.  Gastrointestinal: Negative for abdominal pain, no bowel changes.  Musculoskeletal: Negative for gait problem or joint swelling.  Skin: Negative for rash.  Neurological: Negative for dizziness or headache.  No other specific complaints in a complete review of systems (except as listed in HPI above).   Objective  Vitals:   12/09/17 0804  BP: (!) 190/100  Pulse: 88  Resp: 16  Temp: 98.2 F (36.8 C)  TempSrc: Oral  SpO2: 99%  Weight: 183 lb 12.8 oz (83.4 kg)  Height: 5' 1.5" (1.562 m)    Body mass index is 34.17 kg/m.  Physical Exam  Constitutional: Patient appears well-developed and obese . No distress.  HENT: Head: Normocephalic and atraumatic. Ears: B TMs ok, no erythema or effusion; Nose: Nose normal. Mouth/Throat: Oropharynx is clear and moist. No oropharyngeal exudate.  Eyes: Conjunctivae and EOM are normal. Pupils are equal, round, and reactive to light. No scleral icterus.  Neck: Normal range of motion. Neck supple. No JVD present. No thyromegaly present.  Cardiovascular: Normal rate, regular rhythm and normal  heart sounds.  No murmur heard. No BLE edema. Pulmonary/Chest: Effort normal and breath sounds normal. No respiratory distress. Abdominal: Soft.  Bowel sounds are normal, no distension. There is no tenderness. no masses Breast: no lumps or masses, no nipple discharge or rashes FEMALE GENITALIA:  Not done RECTAL: not done Musculoskeletal: Normal range of motion, no joint effusions. No gross deformities Neurological: he is alert and oriented to person, place, and time. No cranial nerve deficit. Coordination, balance, strength, speech and gait are normal.  Skin: Skin is warm and dry. No rash noted. No erythema.  Psychiatric: Patient has a normal mood and affect. behavior is normal. Judgment and thought content normal.   Recent Results (from the past 2160 hour(s))  Cologuard     Status: None   Collection Time: 09/13/17 12:00 AM  Result Value Ref Range   Cologuard Negative     Comment: Repeat in 3 years     PHQ2/9: Depression screen [facility] 2/9 12/09/2017 08/18/2017  Decreased Interest 0 0  Down, Depressed, Hopeless 0 0  PHQ - 2 Score 0 0    Fall Risk: Fall Risk  12/09/2017 10/27/2017 08/18/2017  Falls in the past year? No No No     Functional Status Survey: Is the patient deaf or have difficulty hearing?: No Does the patient have difficulty seeing, even when wearing glasses/contacts?: No Does the patient have difficulty concentrating, remembering, or making decisions?: No Does the patient have difficulty walking or climbing stairs?: No Does the patient have difficulty dressing or bathing?: No Does the patient have difficulty doing errands alone such as visiting a doctor's office or shopping?: No   Assessment & Plan  1. Well woman exam  Discussed importance of 150 minutes of physical activity weekly, eat two servings of fish weekly, eat one serving of tree nuts ( cashews, pistachios, pecans, almonds.Marland Kitchen) every other day, eat 6 servings of fruit/vegetables daily and drink plenty of water and avoid sweet beverages.   2. Need for shingles vaccine  refused  3. Uncontrolled hypertension  She has not been taking Norvasc, advised  again to take it daily, return in one week for bp check only and if still elevated, we will change HCTZ to combo medication with triamterene.   4. Hypertensive left ventricular hypertrophy, without heart failure   5. Pre-diabetes  She is out of Ozempic , wasted some of it, but needs rx, she states it really helped curb her appetite - Semaglutide (OZEMPIC) 1 MG/DOSE SOPN; Inject 1 mg into the skin once a week.  Dispense: 27 mL; Refill: 1  6. Tobacco use  - varenicline (CHANTIX CONTINUING MONTH PAK) 1 MG tablet; Take 1 tablet (1 mg total) by mouth 2 (two) times daily.  Dispense: 60 tablet; Refill: 2 - varenicline (CHANTIX STARTING MONTH PAK) 0.5 MG X 11 & 1 MG X 42 tablet; Take one 0.5 mg tablet by mouth once daily for 3 days, then increase to one 0.5 mg tablet twice daily for 4 days, then increase to one 1 mg tablet twice daily.  Dispense: 53 tablet; Refill: 0  7. Pure hypercholesterolemia  - atorvastatin (LIPITOR) 20 MG tablet; Take 1 tablet (20 mg total) by mouth daily.  Dispense: 90 tablet; Refill: 1  8. BMI 34.0-34.9,adult   9. Morbid obesity ([place])  Discussed uncontrolled HTN, hyperlipidemia, LVH in a patient that is obese.  We will try Ozempic, discussed life style modifications, she states she will try to lose 10 lbs by next visit   10. Urge  incontinence of urine  - solifenacin (VESICARE) 10 MG tablet; Take 1 tablet (10 mg total) by mouth daily.  Dispense: 90 tablet; Refill: 0

## 2017-12-10 ENCOUNTER — Telehealth: Payer: Self-pay

## 2017-12-10 NOTE — Telephone Encounter (Signed)
Did you call this patient and let them know the status of their PA? I never started a PA. Thanks

## 2017-12-10 NOTE — Telephone Encounter (Signed)
Copied from CRM [id number]. Topic: General - Other >> Dec 10, 2017 10:11 AM Marylen Ponto wrote: Reason for CRM: Pt requested to speak with Dr. Carlynn Purl or her nurse. Pt states she needs a pre authorization for Rx Chantix. Pt stated she was advised that [facility] faxed the pre authorization form and she would need it completed because her insurance denied the Rx. Cb# [phone]  Prior auth form was received and will be done online via CoverMyMeds.

## 2017-12-10 NOTE — Telephone Encounter (Signed)
Patient has been informed of the PA process via MyChart.

## 2017-12-16 ENCOUNTER — Ambulatory Visit (INDEPENDENT_AMBULATORY_CARE_PROVIDER_SITE_OTHER): Payer: Managed Care, Other (non HMO)

## 2017-12-16 VITALS — BP 160/84 | HR 84

## 2017-12-16 DIAGNOSIS — I10 Essential (primary) hypertension: Secondary | ICD-10-CM

## 2017-12-16 NOTE — Progress Notes (Addendum)
Patient is here for a blood pressure check. Patient denies palpitations, shortness of breath or visual disturbances. At previous visit blood pressure was 190/100 with a heart rate of 88. Today during nurse visit first check blood pressure was 162/82. After resting for 10 minutes it was 160/84 and heart rate was 78. She does take any blood pressure medications.  She has not been taking Norvasc, advised again to take it daily, return in one week for bp check only and if still elevated, we will change HCTZ to combo medication with triamterene.   Previous visit:  Has been having chest pain but states that is normal-she is used to it. But sees Dr. Gwen Pounds, cardiologist and had a recent EKG with him. She is taking Norvasc and HCTZ daily and trying to quit smoking with the Chantix. BP is coming down and patient states she is tolerating the medication well.

## 2018-02-04 ENCOUNTER — Ambulatory Visit: Payer: Managed Care, Other (non HMO) | Admitting: Family Medicine

## 2018-02-05 DIAGNOSIS — M67432 Ganglion, left wrist: Secondary | ICD-10-CM | POA: Insufficient documentation

## 2018-02-10 ENCOUNTER — Ambulatory Visit (INDEPENDENT_AMBULATORY_CARE_PROVIDER_SITE_OTHER): Payer: Managed Care, Other (non HMO) | Admitting: Family Medicine

## 2018-02-10 ENCOUNTER — Encounter: Payer: Self-pay | Admitting: Family Medicine

## 2018-02-10 VITALS — BP 180/96 | HR 104 | Temp 99.1°F | Resp 18 | Ht 61.5 in | Wt 184.0 lb

## 2018-02-10 DIAGNOSIS — I119 Hypertensive heart disease without heart failure: Secondary | ICD-10-CM | POA: Diagnosis not present

## 2018-02-10 DIAGNOSIS — R7303 Prediabetes: Secondary | ICD-10-CM

## 2018-02-10 DIAGNOSIS — Z01818 Encounter for other preprocedural examination: Secondary | ICD-10-CM

## 2018-02-10 DIAGNOSIS — I10 Essential (primary) hypertension: Secondary | ICD-10-CM | POA: Diagnosis not present

## 2018-02-10 DIAGNOSIS — N3941 Urge incontinence: Secondary | ICD-10-CM

## 2018-02-10 DIAGNOSIS — Z114 Encounter for screening for human immunodeficiency virus [HIV]: Secondary | ICD-10-CM

## 2018-02-10 MED ORDER — SOLIFENACIN SUCCINATE 10 MG PO TABS: 10.0000 mg | ORAL_TABLET | Freq: Every day | ORAL | 1 refills | Status: DC

## 2018-02-10 MED ORDER — HYDROCHLOROTHIAZIDE 12.5 MG PO TABS: 12.5000 mg | ORAL_TABLET | Freq: Every day | ORAL | 0 refills | Status: DC

## 2018-02-10 MED ORDER — AMLODIPINE BESYLATE 10 MG PO TABS: 10.0000 mg | ORAL_TABLET | Freq: Every day | ORAL | 0 refills | Status: DC

## 2018-02-10 NOTE — Progress Notes (Signed)
Name: [name]   MRN: [id number]    DOB: [birth year]   Date:02/10/2018       Progress Note  Subjective  Chief Complaint  Chief Complaint  Patient presents with  . Hypertension  . Pre-op Exam    HPI  Pre op requested by Dr. Hyacinth Meeker: she has a painful ganglion cyst on left dorsal risk, surgery planned for 03/01/2018. Patient had hysterectomy in the past and denies any reactions to anesthesia. She does not have any dentures. She denies chest pain or SOB and had a normal stress test done 07/2017. Explained that her bp is out of control and we need to get it within normal range to decrease chance of complications on perioperative period  HTN: she did not take medications today, bp is still out of control. We will adjust dose of norvasc, continue HcTz at 12.5 mg since she has hypercalcemia   Pre-diabetes: she denies polyphagia, polyuria or polydipsia, she will return tomorrow for labs  Urinary urgency: improved with medication and needs a refill  Morbid obesity: based on BMI above 30 with risk factors. Discussed life style modification  Patient Active Problem List   Diagnosis Date Noted  . Ganglion cyst of dorsum of left wrist 02/05/2018  . LVH (left ventricular hypertrophy) due to hypertensive disease, without heart failure 09/23/2017  . SOBOE (shortness of breath on exertion) 09/23/2017  . Hyperlipidemia, mixed 08/28/2017  . Hyperglycemia 08/25/2017  . Hypercalcemia due to thiazide and vitamin A 08/25/2017  . Hypertension 08/18/2017  . GAD (generalized anxiety disorder) 08/18/2017  . GERD (gastroesophageal reflux disease) 08/18/2017  . Panic attack 08/18/2017  . Tobacco use 08/18/2017  . Snoring 08/18/2017  . Systolic ejection murmur 08/18/2017    Past Surgical History:  Procedure Laterality Date  . ABDOMINAL HYSTERECTOMY     Complete Hysterectomy  . CHOLECYSTECTOMY      Family History  Problem Relation Age of Onset  . Hypertension Mother   . Stroke Father   .  Hypertension Father   . Hypertension Sister   . Breast cancer Neg Hx     Social History   Socioeconomic History  . Marital status: Married    Spouse name: [name]   . Number of children: 3  . Years of education: Not on file  . Highest education level: 12th grade  Occupational History  . Occupation: Acupuncturist   Social Needs  . Financial resource strain: Not hard at all  . Food insecurity:    Worry: Never true    Inability: Never true  . Transportation needs:    Medical: No    Non-medical: No  Tobacco Use  . Smoking status: Former Smoker    Years: 18.00    Types: Cigarettes    Start date: 08/19/1999    Last attempt to quit: 01/22/2018    Years since quitting: 0.0  . Smokeless tobacco: Never Used  . Tobacco comment: she smoked half pack for 18 years   Substance and Sexual Activity  . Alcohol use: Yes    Alcohol/week: 0.6 oz    Types: 1 Glasses of wine per week  . Drug use: No  . Sexual activity: Yes    Partners: Male    Birth control/protection: Other-see comments    Comment: Hysterectomy  Lifestyle  . Physical activity:    Days per week: 5 days    Minutes per session: 20 min  . Stress: Rather much  Relationships  . Social connections:    Talks on  phone: More than three times a week    Gets together: Twice a week    Attends religious service: More than 4 times per year    Active member of club or organization: Yes    Attends meetings of clubs or organizations: More than 4 times per year    Relationship status: Married  . Intimate partner violence:    Fear of current or ex partner: No    Emotionally abused: No    Physically abused: No    Forced sexual activity: No  Other Topics Concern  . Not on file  Social History Narrative   Re-married since 2012   Only the two of them at home   She has three children from previous relationship and he has one   She works a full at [employer] and part time at [employer]    Her only son ( youngest  child) went to prison for 3 years ( from 2014 - 2017), since than she has been anxious and scared of darkness.      Current Outpatient Medications:  .  amLODipine (NORVASC) 5 MG tablet, Take 1 tablet (5 mg total) by mouth daily., Disp: 30 tablet, Rfl: 1 .  aspirin EC 81 MG tablet, Take 162 mg by mouth daily., Disp: , Rfl:  .  atorvastatin (LIPITOR) 20 MG tablet, Take 1 tablet (20 mg total) by mouth daily., Disp: 90 tablet, Rfl: 1 .  busPIRone (BUSPAR) 5 MG tablet, Take 1 tablet (5 mg total) by mouth every evening., Disp: 90 tablet, Rfl: 0 .  hydrALAZINE (APRESOLINE) 10 MG tablet, Take 1 tablet (10 mg total) by mouth 2 (two) times daily as needed. For bp above 150/90, Disp: 60 tablet, Rfl: 0 .  hydrochlorothiazide (HYDRODIURIL) 12.5 MG tablet, Take 1 tablet (12.5 mg total) by mouth daily., Disp: 90 tablet, Rfl: 0 .  ranitidine (ZANTAC) 300 MG tablet, Take 1 tablet (300 mg total) by mouth daily as needed for heartburn., Disp: 90 tablet, Rfl: 0 .  Semaglutide (OZEMPIC) 1 MG/DOSE SOPN, Inject 1 mg into the skin once a week., Disp: 27 mL, Rfl: 1 .  solifenacin (VESICARE) 10 MG tablet, Take 1 tablet (10 mg total) by mouth daily., Disp: 90 tablet, Rfl: 0 .  varenicline (CHANTIX CONTINUING MONTH PAK) 1 MG tablet, Take 1 tablet (1 mg total) by mouth 2 (two) times daily., Disp: 60 tablet, Rfl: 2  Allergies  Allergen Reactions  . Penicillins Itching    Has patient had a PCN reaction causing immediate rash, facial/tongue/throat swelling, SOB or lightheadedness with hypotension: No Has patient had a PCN reaction causing severe rash involving mucus membranes or skin necrosis: No Has patient had a PCN reaction that required hospitalization: No Has patient had a PCN reaction occurring within the last 10 years: No If all of the above answers are "NO", then may proceed with Cephalosporin use.      ROS  Constitutional: Negative for fever or weight change.  Respiratory: Negative for cough and shortness of  breath.   Cardiovascular: Negative for chest pain or palpitations.  Gastrointestinal: Negative for abdominal pain, no bowel changes.  Musculoskeletal: Negative for gait problem or joint swelling.  Skin: Negative for rash.  Neurological: Negative for dizziness or headache.  No other specific complaints in a complete review of systems (except as listed in HPI above).  Objective  Vitals:   02/10/18 1519  BP: (!) 180/96  Pulse: (!) 104  Resp: 18  Temp: 99.1 F (37.3  C)  TempSrc: Oral  SpO2: 98%  Weight: 184 lb (83.5 kg)  Height: 5' 1.5" (1.562 m)    Body mass index is 34.2 kg/m.  Physical Exam  Constitutional: Patient appears well-developed and well-nourished. Obese No distress.  HEENT: head atraumatic, normocephalic, pupils equal and reactive to light, , neck supple, throat within normal limits Cardiovascular: Normal rate, regular rhythm and normal heart sounds.  No murmur heard. No BLE edema. Pulmonary/Chest: Effort normal and breath sounds normal. No respiratory distress. Abdominal: Soft.  There is no tenderness. Psychiatric: Patient has a normal mood and affect. behavior is normal. Judgment and thought content normal.  PHQ2/9: [facility] 02/10/2018 12/09/2017 08/18/2017  Decreased Interest 0 0 0  Down, Depressed, Hopeless 0 0 0  PHQ - 2 Score 0 0 0     Fall Risk: Fall Risk  02/10/2018 12/09/2017 10/27/2017 08/18/2017  Falls in the past year? No No No No     Functional Status Survey: Is the patient deaf or have difficulty hearing?: No Does the patient have difficulty seeing, even when wearing glasses/contacts?: No Does the patient have difficulty concentrating, remembering, or making decisions?: No Does the patient have difficulty walking or climbing stairs?: No Does the patient have difficulty dressing or bathing?: No Does the patient have difficulty doing errands alone such as visiting a doctor's office or shopping?: No   Assessment & Plan  1.  Uncontrolled HTN    - COMPLETE METABOLIC PANEL WITH GFR - CBC with Differential/Platelet - amLODipine (NORVASC) 10 MG tablet; Take 1 tablet (10 mg total) by mouth daily.  Dispense: 30 tablet; Refill: 0 - hydrochlorothiazide (HYDRODIURIL) 12.5 MG tablet; Take 1 tablet (12.5 mg total) by mouth daily.  Dispense: 90 tablet; Refill: 0  2. Hypertensive left ventricular hypertrophy, without heart failure   3. Morbid obesity ([facility])  Discussed with the patient the risk posed by an increased BMI. Discussed importance of portion control, calorie counting and at least 150 minutes of physical activity weekly. Avoid sweet beverages and drink more water. Eat at least 6 servings of fruit and vegetables daily   4. Pre-diabetes  - Hemoglobin A1c  5. Encounter for screening for HIV  - HIV antibody (with reflex)  6. Pre-op evaluation  We will recheck bp tomorrow and next week and if back to normal we will fill forms for surgery    7. Urge incontinence of urine  - solifenacin (VESICARE) 10 MG tablet; Take 1 tablet (10 mg total) by mouth daily.  Dispense: 90 tablet; Refill: 1

## 2018-02-15 ENCOUNTER — Ambulatory Visit: Payer: Managed Care, Other (non HMO)

## 2018-02-15 VITALS — BP 162/96 | HR 78

## 2018-02-15 DIAGNOSIS — I10 Essential (primary) hypertension: Secondary | ICD-10-CM

## 2018-02-15 NOTE — Progress Notes (Signed)
Patient is here for a blood pressure check. Patient denies chest pain, palpitations, shortness of breath or visual disturbances. At previous visit blood pressure was 180/96 with a heart rate of 104. Today during nurse visit first check blood pressure was 166/100. After resting for 10 minutes it was 162/96 and heart rate was 78. She does take any blood pressure medications.  Patient states she has not started new BP dosage yet, but will pick it up today. Has Prep-Op on the 02/22/18, she does have headaches but BP is coming down.

## 2018-02-15 NOTE — Addendum Note (Signed)
Addended by: Cynda FamiliaJOHNSON, Anays Detore L on: 02/15/2018 08:40 AM   Modules accepted: Orders

## 2018-02-16 ENCOUNTER — Other Ambulatory Visit: Payer: Self-pay | Admitting: Specialist

## 2018-02-16 LAB — CMP14+EGFR
A/G RATIO: 2 (ref 1.2–2.2)
ALBUMIN: 5 g/dL (ref 3.5–5.5)
ALK PHOS: 65 IU/L (ref 39–117)
ALT: 35 IU/L — ABNORMAL HIGH (ref 0–32)
AST: 23 IU/L (ref 0–40)
BILIRUBIN TOTAL: 0.3 mg/dL (ref 0.0–1.2)
BUN / CREAT RATIO: 23 (ref 9–23)
BUN: 18 mg/dL (ref 6–24)
CHLORIDE: 102 mmol/L (ref 96–106)
CO2: 23 mmol/L (ref 20–29)
Calcium: 10.2 mg/dL (ref 8.7–10.2)
Creatinine, Ser: 0.79 mg/dL (ref 0.57–1.00)
GFR calc Af Amer: 101 mL/min/{1.73_m2} (ref >59)
GFR calc non Af Amer: 88 mL/min/{1.73_m2} (ref >59)
GLOBULIN, TOTAL: 2.5 g/dL (ref 1.5–4.5)
Glucose: 97 mg/dL (ref 65–99)
POTASSIUM: 4.1 mmol/L (ref 3.5–5.2)
SODIUM: 141 mmol/L (ref 134–144)
Total Protein: 7.5 g/dL (ref 6.0–8.5)

## 2018-02-16 LAB — CBC WITH DIFFERENTIAL/PLATELET
Basophils Absolute: 0 10*3/uL (ref 0.0–0.2)
Basos: 0 %
EOS (ABSOLUTE): 0.1 10*3/uL (ref 0.0–0.4)
Eos: 2 %
Hematocrit: 43.2 % (ref 34.0–46.6)
Hemoglobin: 14.1 g/dL (ref 11.1–15.9)
Immature Grans (Abs): 0 10*3/uL (ref 0.0–0.1)
Immature Granulocytes: 0 %
LYMPHS ABS: 2.8 10*3/uL (ref 0.7–3.1)
Lymphs: 45 %
MCH: 27 pg (ref 26.6–33.0)
MCHC: 32.6 g/dL (ref 31.5–35.7)
MCV: 83 fL (ref 79–97)
Monocytes Absolute: 0.4 10*3/uL (ref 0.1–0.9)
Monocytes: 7 %
NEUTROS ABS: 2.8 10*3/uL (ref 1.4–7.0)
Neutrophils: 46 %
PLATELETS: 289 10*3/uL (ref 150–450)
RBC: 5.22 x10E6/uL (ref 3.77–5.28)
RDW: 13.9 % (ref 12.3–15.4)
WBC: 6.1 10*3/uL (ref 3.4–10.8)

## 2018-02-16 LAB — HEMOGLOBIN A1C
Est. average glucose Bld gHb Est-mCnc: 128 mg/dL
Hgb A1c MFr Bld: 6.1 % — ABNORMAL HIGH (ref 4.8–5.6)

## 2018-02-16 LAB — HIV ANTIBODY (ROUTINE TESTING W REFLEX): HIV Screen 4th Generation wRfx: NONREACTIVE

## 2018-02-22 ENCOUNTER — Encounter
Admission: RE | Admit: 2018-02-22 | Discharge: 2018-02-22 | Disposition: A | Discharge status: Home / Self Care | Payer: Managed Care, Other (non HMO) | Source: Ambulatory Visit | Attending: Specialist | Admitting: Specialist

## 2018-02-22 ENCOUNTER — Other Ambulatory Visit: Payer: Self-pay

## 2018-02-22 DIAGNOSIS — M67432 Ganglion, left wrist: Secondary | ICD-10-CM | POA: Diagnosis not present

## 2018-02-22 DIAGNOSIS — Z01818 Encounter for other preprocedural examination: Secondary | ICD-10-CM | POA: Insufficient documentation

## 2018-02-22 NOTE — Patient Instructions (Signed)
Your procedure is scheduled on: Monday, March 01, 2018 Report to Day Surgery on the 2nd floor of the [facility]. To find out your arrival time, please call [phone] between 1PM - 3PM on: Friday, February 26, 2018  REMEMBER: Instructions that are not followed completely may result in serious medical risk, up to and including death; or upon the discretion of your surgeon and anesthesiologist your surgery may need to be rescheduled.  Do not eat food after midnight the night before your procedure.  No gum chewing, lozengers or hard candies.  You may however, drink CLEAR liquids up to 2 hours before you are scheduled to arrive for your surgery. Do not drink anything within 2 hours of the start of your surgery.  Clear liquids include: - water  - apple juice without pulp - clear gatorade - black coffee or tea (Do NOT add anything to the coffee or tea) Do NOT drink anything that is not on this list.  No Alcohol for 24 hours before or after surgery.  No Smoking including e-cigarettes for 24 hours prior to surgery.  No chewable tobacco products for at least 6 hours prior to surgery.  No nicotine patches on the day of surgery.  On the morning of surgery brush your teeth with toothpaste and water, you may rinse your mouth with mouthwash if you wish. Do not swallow any toothpaste or mouthwash.  Notify your doctor if there is any change in your medical condition (cold, fever, infection).  Do not wear jewelry, make-up, hairpins, clips or nail polish.  Do not wear lotions, powders, or perfumes. You may wear deodorant.  Do not shave 48 hours prior to surgery.   Contacts and dentures may not be worn into surgery.  Do not bring valuables to the hospital, including drivers license, insurance or credit cards.  [facility] is not responsible for any belongings or valuables.   TAKE THESE MEDICATIONS THE MORNING OF SURGERY:  1.  Amlodipine 2.  Ranitidine (take one the night before surgery  and one the morning of surgery - helps to prevent nausea after surgery)  Use CHG Soap as directed on instruction sheet.  Stop aspirin and  Anti-inflammatories (NSAIDS) such as Advil, Aleve, Ibuprofen, Motrin, Naproxen, Naprosyn and Aspirin based products such as Excedrin, Goodys Powder, BC Powder. (May take Tylenol or Acetaminophen if needed.)  Stop ANY OVER THE COUNTER supplements until after surgery.  Wear comfortable clothing (specific to your surgery type) to the hospital.  If you are being discharged the day of surgery, you will not be allowed to drive home. You will need a responsible adult to drive you home and stay with you that night.   If you are taking public transportation, you will need to have a responsible adult with you. Please confirm with your physician that it is acceptable to use public transportation.   Please call [phone] if you have any questions about these instructions.

## 2018-02-24 NOTE — Pre-Procedure Instructions (Signed)
VERIFIED WITH Dereck Agerton AT DR [facility] WANTS BOTH ANTIBIOTICS ORDERED GIVEN

## 2018-02-28 MED ORDER — CLINDAMYCIN PHOSPHATE 600 MG/50ML IV SOLN
600.0000 mg | INTRAVENOUS | Status: AC
  Administered 2018-03-01: 600 mg via INTRAVENOUS

## 2018-02-28 MED ORDER — CEFAZOLIN SODIUM-DEXTROSE 2-4 GM/100ML-% IV SOLN
2.0000 g | INTRAVENOUS | Status: AC
  Administered 2018-03-01: 2 g via INTRAVENOUS

## 2018-03-01 ENCOUNTER — Ambulatory Visit: Payer: Managed Care, Other (non HMO) | Admitting: Certified Registered"

## 2018-03-01 ENCOUNTER — Encounter
Admission: RE | Disposition: A | Discharge status: Home / Self Care | Payer: Self-pay | Source: Ambulatory Visit | Attending: Specialist

## 2018-03-01 ENCOUNTER — Encounter: Payer: Self-pay | Admitting: *Deleted

## 2018-03-01 ENCOUNTER — Other Ambulatory Visit: Payer: Self-pay

## 2018-03-01 ENCOUNTER — Ambulatory Visit
Admission: RE | Admit: 2018-03-01 | Discharge: 2018-03-01 | Disposition: A | Discharge status: Home / Self Care | Payer: Managed Care, Other (non HMO) | Source: Ambulatory Visit | Attending: Specialist | Admitting: Specialist

## 2018-03-01 DIAGNOSIS — I10 Essential (primary) hypertension: Secondary | ICD-10-CM | POA: Diagnosis not present

## 2018-03-01 DIAGNOSIS — E669 Obesity, unspecified: Secondary | ICD-10-CM | POA: Insufficient documentation

## 2018-03-01 DIAGNOSIS — K219 Gastro-esophageal reflux disease without esophagitis: Secondary | ICD-10-CM | POA: Insufficient documentation

## 2018-03-01 DIAGNOSIS — Z87891 Personal history of nicotine dependence: Secondary | ICD-10-CM | POA: Diagnosis not present

## 2018-03-01 DIAGNOSIS — M67432 Ganglion, left wrist: Secondary | ICD-10-CM | POA: Diagnosis not present

## 2018-03-01 DIAGNOSIS — Z6833 Body mass index (BMI) 33.0-33.9, adult: Secondary | ICD-10-CM | POA: Insufficient documentation

## 2018-03-01 HISTORY — PX: GANGLION CYST EXCISION: SHX1691

## 2018-03-01 SURGERY — EXCISION, GANGLION CYST, WRIST
Anesthesia: General | Laterality: Left

## 2018-03-01 MED ORDER — ONDANSETRON HCL 4 MG/2ML IJ SOLN
INTRAMUSCULAR | Status: DC | PRN
  Administered 2018-03-01: 4 mg via INTRAVENOUS

## 2018-03-01 MED ORDER — MIDAZOLAM HCL 2 MG/2ML IJ SOLN
INTRAMUSCULAR | Status: AC
  Filled 2018-03-01: qty 2

## 2018-03-01 MED ORDER — FENTANYL CITRATE (PF) 100 MCG/2ML IJ SOLN: 25.0000 ug | INTRAMUSCULAR | Status: DC | PRN

## 2018-03-01 MED ORDER — MELOXICAM 7.5 MG PO TABS
15.0000 mg | ORAL_TABLET | ORAL | Status: AC
  Administered 2018-03-01: 15 mg via ORAL

## 2018-03-01 MED ORDER — LIDOCAINE HCL (PF) 2 % IJ SOLN
INTRAMUSCULAR | Status: AC
  Filled 2018-03-01: qty 10

## 2018-03-01 MED ORDER — MELOXICAM 7.5 MG PO TABS
ORAL_TABLET | ORAL | Status: AC
  Administered 2018-03-01: 15 mg via ORAL
  Filled 2018-03-01: qty 2

## 2018-03-01 MED ORDER — BUPIVACAINE HCL 0.5 % IJ SOLN
INTRAMUSCULAR | Status: DC | PRN
  Administered 2018-03-01: 12 mL

## 2018-03-01 MED ORDER — GLYCOPYRROLATE 0.2 MG/ML IJ SOLN
INTRAMUSCULAR | Status: DC | PRN
  Administered 2018-03-01: 0.2 mg via INTRAVENOUS

## 2018-03-01 MED ORDER — FENTANYL CITRATE (PF) 100 MCG/2ML IJ SOLN
INTRAMUSCULAR | Status: AC
  Filled 2018-03-01: qty 2

## 2018-03-01 MED ORDER — MELOXICAM 15 MG PO TABS: 15.0000 mg | ORAL_TABLET | Freq: Every day | ORAL | 3 refills | Status: DC

## 2018-03-01 MED ORDER — ONDANSETRON HCL 4 MG/2ML IJ SOLN
INTRAMUSCULAR | Status: AC
  Filled 2018-03-01: qty 2

## 2018-03-01 MED ORDER — CHLORHEXIDINE GLUCONATE CLOTH 2 % EX PADS: 6.0000 | MEDICATED_PAD | Freq: Once | CUTANEOUS | Status: DC

## 2018-03-01 MED ORDER — BUPIVACAINE HCL (PF) 0.5 % IJ SOLN
INTRAMUSCULAR | Status: AC
  Filled 2018-03-01: qty 30

## 2018-03-01 MED ORDER — HYDROCODONE-ACETAMINOPHEN 5-325 MG PO TABS: 1.0000 | ORAL_TABLET | Freq: Four times a day (QID) | ORAL | 0 refills | Status: DC | PRN

## 2018-03-01 MED ORDER — GABAPENTIN 400 MG PO CAPS: 400.0000 mg | ORAL_CAPSULE | Freq: Two times a day (BID) | ORAL | 3 refills | Status: DC

## 2018-03-01 MED ORDER — PROPOFOL 10 MG/ML IV BOLUS
INTRAVENOUS | Status: AC
  Filled 2018-03-01: qty 20

## 2018-03-01 MED ORDER — PROPOFOL 10 MG/ML IV BOLUS
INTRAVENOUS | Status: DC | PRN
  Administered 2018-03-01: 150 mg via INTRAVENOUS
  Administered 2018-03-01: 20 mg via INTRAVENOUS

## 2018-03-01 MED ORDER — GABAPENTIN 300 MG PO CAPS
ORAL_CAPSULE | ORAL | Status: AC
  Administered 2018-03-01: 300 mg via ORAL
  Filled 2018-03-01: qty 1

## 2018-03-01 MED ORDER — GLYCOPYRROLATE 0.2 MG/ML IJ SOLN
INTRAMUSCULAR | Status: AC
  Filled 2018-03-01: qty 1

## 2018-03-01 MED ORDER — LACTATED RINGERS IV SOLN
INTRAVENOUS | Status: DC
  Administered 2018-03-01: 08:00:00 via INTRAVENOUS

## 2018-03-01 MED ORDER — MIDAZOLAM HCL 5 MG/5ML IJ SOLN
INTRAMUSCULAR | Status: DC | PRN
  Administered 2018-03-01: 2 mg via INTRAVENOUS

## 2018-03-01 MED ORDER — CEFAZOLIN SODIUM-DEXTROSE 2-4 GM/100ML-% IV SOLN
INTRAVENOUS | Status: AC
  Filled 2018-03-01: qty 100

## 2018-03-01 MED ORDER — LIDOCAINE HCL (PF) 2 % IJ SOLN
INTRAMUSCULAR | Status: DC | PRN
  Administered 2018-03-01: 50 mg

## 2018-03-01 MED ORDER — ONDANSETRON HCL 4 MG/2ML IJ SOLN: 4.0000 mg | Freq: Once | INTRAMUSCULAR | Status: DC | PRN

## 2018-03-01 MED ORDER — CLINDAMYCIN PHOSPHATE 600 MG/50ML IV SOLN
INTRAVENOUS | Status: AC
  Filled 2018-03-01: qty 50

## 2018-03-01 MED ORDER — MELOXICAM 7.5 MG PO TABS
ORAL_TABLET | ORAL | Status: DC
Start: 2018-03-01 — End: 2018-03-01
  Filled 2018-03-01: qty 1

## 2018-03-01 MED ORDER — FENTANYL CITRATE (PF) 100 MCG/2ML IJ SOLN
INTRAMUSCULAR | Status: DC | PRN
  Administered 2018-03-01 (×2): 50 ug via INTRAVENOUS

## 2018-03-01 MED ORDER — GABAPENTIN 300 MG PO CAPS
300.0000 mg | ORAL_CAPSULE | ORAL | Status: AC
  Administered 2018-03-01: 300 mg via ORAL

## 2018-03-01 MED ORDER — DEXAMETHASONE SODIUM PHOSPHATE 10 MG/ML IJ SOLN
INTRAMUSCULAR | Status: DC | PRN
  Administered 2018-03-01: 5 mg via INTRAVENOUS

## 2018-03-01 SURGICAL SUPPLY — 31 items
BLADE SURG MINI STRL (BLADE) ×2 IMPLANT
BNDG ESMARK 4X12 TAN STRL LF (GAUZE/BANDAGES/DRESSINGS) ×2 IMPLANT
CANISTER SUCT 1200ML W/VALVE (MISCELLANEOUS) ×2 IMPLANT
CHLORAPREP W/TINT 26ML (MISCELLANEOUS) ×2 IMPLANT
CUFF TOURN 18 STER (MISCELLANEOUS) IMPLANT
DRSG GAUZE FLUFF 36X18 (GAUZE/BANDAGES/DRESSINGS) ×2 IMPLANT
ELECT REM PT RETURN 9FT ADLT (ELECTROSURGICAL) ×2
ELECTRODE REM PT RTRN 9FT ADLT (ELECTROSURGICAL) ×1 IMPLANT
GAUZE PETRO XEROFOAM 1X8 (MISCELLANEOUS) ×2 IMPLANT
GAUZE SPONGE 4X4 12PLY STRL (GAUZE/BANDAGES/DRESSINGS) ×2 IMPLANT
GLOVE SURG ORTHO 8.0 STRL STRW (GLOVE) ×2 IMPLANT
GOWN STRL REUS W/ TWL LRG LVL3 (GOWN DISPOSABLE) ×1 IMPLANT
GOWN STRL REUS W/TWL LRG LVL3 (GOWN DISPOSABLE) ×1
GOWN STRL REUS W/TWL LRG LVL4 (GOWN DISPOSABLE) ×2 IMPLANT
KIT TURNOVER KIT A (KITS) IMPLANT
NS IRRIG 500ML POUR BTL (IV SOLUTION) ×2 IMPLANT
PACK EXTREMITY ARMC (MISCELLANEOUS) ×2 IMPLANT
PAD CAST CTTN 4X4 STRL (SOFTGOODS) ×1 IMPLANT
PADDING CAST COTTON 4X4 STRL (SOFTGOODS) ×1
SPLINT CAST 1 STEP 3X12 (MISCELLANEOUS) ×2 IMPLANT
STOCKINETTE 48X4 2 PLY STRL (GAUZE/BANDAGES/DRESSINGS) ×1 IMPLANT
STOCKINETTE BIAS CUT 4 980044 (GAUZE/BANDAGES/DRESSINGS) ×2 IMPLANT
STOCKINETTE STRL 4IN 9604848 (GAUZE/BANDAGES/DRESSINGS) ×2 IMPLANT
STRAP SAFETY 5IN WIDE (MISCELLANEOUS) ×2 IMPLANT
SUT ETHILON 4-0 (SUTURE) ×1
SUT ETHILON 4-0 FS2 18XMFL BLK (SUTURE) ×1
SUT ETHILON 5-0 (SUTURE) ×1
SUT ETHILON 5-0 C-3 18XMFL BLK (SUTURE) ×1
SUT VIC AB 4-0 FS2 27 (SUTURE) ×2 IMPLANT
SUTURE ETHLN 4-0 FS2 18XMF BLK (SUTURE) ×1 IMPLANT
SUTURE ETHLN 5-0 C3 18XMF BLK (SUTURE) ×1 IMPLANT

## 2018-03-01 NOTE — Discharge Instructions (Signed)

## 2018-03-01 NOTE — Anesthesia Procedure Notes (Signed)
Procedure Name: LMA Insertion Performed by: Monty Spicher, CRNA Pre-anesthesia Checklist: Patient identified, Patient being monitored, Timeout performed, Emergency Drugs available and Suction available Patient Re-evaluated:Patient Re-evaluated prior to induction Oxygen Delivery Method: Circle system utilized Preoxygenation: Pre-oxygenation with 100% oxygen Induction Type: IV induction Ventilation: Mask ventilation without difficulty LMA: LMA inserted LMA Size: 3.5 Tube type: Oral Number of attempts: 1 Placement Confirmation: positive ETCO2 and breath sounds checked- equal and bilateral Tube secured with: Tape Dental Injury: Teeth and Oropharynx as per pre-operative assessment        

## 2018-03-01 NOTE — H&P (Signed)
THE PATIENT WAS SEEN PRIOR TO SURGERY TODAY.  HISTORY, ALLERGIES, HOME MEDICATIONS AND OPERATIVE PROCEDURE WERE REVIEWED. RISKS AND BENEFITS OF SURGERY DISCUSSED WITH PATIENT AGAIN.  NO CHANGES FROM INITIAL HISTORY AND PHYSICAL NOTED.    

## 2018-03-01 NOTE — Anesthesia Postprocedure Evaluation (Signed)
Anesthesia Post Note  Patient: [name]  Procedure(s) Performed: REMOVAL GANGLION OF WRIST (Left )  Patient location during evaluation: PACU Anesthesia Type: General Level of consciousness: awake and alert Pain management: pain level controlled Vital Signs Assessment: post-procedure vital signs reviewed and stable Respiratory status: spontaneous breathing, nonlabored ventilation, respiratory function stable and patient connected to nasal cannula oxygen Cardiovascular status: blood pressure returned to baseline and stable Postop Assessment: no apparent nausea or vomiting Anesthetic complications: no     Last Vitals:  Vitals:   03/01/18 1038 03/01/18 1055  BP: (!) 141/77 (!) 149/79  Pulse: 87 82  Resp: 16 16  Temp: 36.4 C 36.6 C  SpO2: 100% 100%    Last Pain:  Vitals:   03/01/18 1055  TempSrc: Temporal  PainSc: 0-No pain                 Iniya Matzek S

## 2018-03-01 NOTE — Op Note (Signed)
03/01/2018  9:50 AM  PATIENT:  [name]    PRE-OPERATIVE DIAGNOSIS:  GANGLION CYST OF LEFT DORSAL WRIST  POST-OPERATIVE DIAGNOSIS:  Same  PROCEDURE:  REMOVAL GANGLION OF WRIST LEFT  SURGEON:  Valinda HoarMILLER,Dortha Neighbors E, MD  ANESTHESIA:   General  TOURNIQUET TIME:  21  MIN  PREOPERATIVE INDICATIONS:  Chelsea ConverseJuliet B Seaberry is a  52 y.o. female with a diagnosis of GANGLION CYST OF LEFT DORSAL WRIST who failed conservative measures and elected for surgical management.    The risks benefits and alternatives were discussed with the patient preoperatively including but not limited to the risks of infection, bleeding, nerve injury, cardiopulmonary complications, the need for revision surgery, among others, and the patient was willing to proceed.   OPERATIVE FINDINGS: Large, deep ganglion extending down to wrist joint  OPERATIVE PROCEDURE: The patient was brought to the operating room where they underwent satisfactory general LMA anesthesia.  The  arm was prepped and draped in sterile fashion.  Esmarch was applied and tourniquet inflated to 250 mmHg. .  A short transverse incision was made over the dorsal aspect of the  wrist.  Dissection was carried out carefully under loupe magnification with fine scissors.  The ganglion was excised entirely.  The wound was irrigated and subcutaneous tissue closed with 4-0 Vicryl and the skin with 5-0 nylon.  Sponge and needle counts were correct.  1/2%  Marcaine was placed in the wound.  A dry sterile compression hand dressing with volar splint was applied.  Tourniquet was deflated with good return of blood flow to the hand.  The patient was awakened and taken to recovery in good condition.    Valinda HoarHoward E Brently Voorhis, M.D.

## 2018-03-01 NOTE — Anesthesia Post-op Follow-up Note (Signed)
Anesthesia QCDR form completed.        

## 2018-03-01 NOTE — Transfer of Care (Signed)
Immediate Anesthesia Transfer of Care Note  Patient: [name]  Procedure(s) Performed: REMOVAL GANGLION OF WRIST (Left )  Patient Location: PACU  Anesthesia Type:General  Level of Consciousness: awake  Airway & Oxygen Therapy: Patient Spontanous Breathing and Patient connected to face mask oxygen  Post-op Assessment: Report given to RN and Post -op Vital signs reviewed and stable  Post vital signs: Reviewed  Last Vitals:  Vitals Value Taken Time  BP 158/101 03/01/2018 10:01 AM  Temp 36.6 C 03/01/2018 10:00 AM  Pulse 102 03/01/2018 10:01 AM  Resp 15 03/01/2018 10:00 AM  SpO2 99 % 03/01/2018 10:01 AM  Vitals shown include unvalidated device data.  Last Pain:  Vitals:   03/01/18 0748  TempSrc: Temporal  PainSc: 0-No pain         Complications: No apparent anesthesia complications

## 2018-03-01 NOTE — Anesthesia Preprocedure Evaluation (Signed)
Anesthesia Evaluation  Patient identified by MRN, date of birth, ID band Patient awake    Reviewed: Allergy & Precautions, NPO status , Patient's Chart, lab work & pertinent test results, reviewed documented beta blocker date and time   Airway Mallampati: III  TM Distance: >3 FB     Dental  (+) Chipped   Pulmonary former smoker,           Cardiovascular hypertension, Pt. on medications      Neuro/Psych    GI/Hepatic GERD  Controlled,  Endo/Other    Renal/GU      Musculoskeletal   Abdominal   Peds  Hematology   Anesthesia Other Findings Obese.  Reproductive/Obstetrics                             Anesthesia Physical Anesthesia Plan  ASA: III  Anesthesia Plan: General   Post-op Pain Management:    Induction: Intravenous  PONV Risk Score and Plan:   Airway Management Planned: LMA  Additional Equipment:   Intra-op Plan:   Post-operative Plan:   Informed Consent: I have reviewed the patients History and Physical, chart, labs and discussed the procedure including the risks, benefits and alternatives for the proposed anesthesia with the patient or authorized representative who has indicated his/her understanding and acceptance.     Plan Discussed with: CRNA  Anesthesia Plan Comments:         Anesthesia Quick Evaluation

## 2018-03-02 LAB — SURGICAL PATHOLOGY

## 2018-03-16 ENCOUNTER — Ambulatory Visit: Payer: Managed Care, Other (non HMO) | Admitting: Family Medicine

## 2018-03-19 LAB — BASIC METABOLIC PANEL
Creatinine: 0.8 (ref 0.5–1.1)
GLUCOSE: 104

## 2018-03-19 LAB — LIPID PANEL
Cholesterol: 195 (ref 0–200)
HDL: 47 (ref 35–70)
LDL Cholesterol: 115
Triglycerides: 165 — AB (ref 40–160)

## 2018-03-19 LAB — HEMOGLOBIN A1C: Hgb A1c MFr Bld: 6.4 — AB (ref 4.0–6.0)

## 2018-04-20 ENCOUNTER — Ambulatory Visit (INDEPENDENT_AMBULATORY_CARE_PROVIDER_SITE_OTHER): Payer: Managed Care, Other (non HMO) | Admitting: Family Medicine

## 2018-04-20 ENCOUNTER — Encounter: Payer: Self-pay | Admitting: Family Medicine

## 2018-04-20 VITALS — BP 136/72 | HR 97 | Temp 98.4°F | Resp 16 | Ht 62.0 in | Wt 187.8 lb

## 2018-04-20 DIAGNOSIS — I119 Hypertensive heart disease without heart failure: Secondary | ICD-10-CM | POA: Diagnosis not present

## 2018-04-20 DIAGNOSIS — I10 Essential (primary) hypertension: Secondary | ICD-10-CM

## 2018-04-20 DIAGNOSIS — E782 Mixed hyperlipidemia: Secondary | ICD-10-CM

## 2018-04-20 DIAGNOSIS — R7303 Prediabetes: Secondary | ICD-10-CM

## 2018-04-20 DIAGNOSIS — E669 Obesity, unspecified: Secondary | ICD-10-CM

## 2018-04-20 DIAGNOSIS — Z23 Encounter for immunization: Secondary | ICD-10-CM | POA: Diagnosis not present

## 2018-04-20 MED ORDER — HYDROCHLOROTHIAZIDE 12.5 MG PO TABS: 12.5000 mg | ORAL_TABLET | Freq: Every day | ORAL | 0 refills | Status: DC

## 2018-04-20 MED ORDER — SEMAGLUTIDE (1 MG/DOSE) 2 MG/1.5ML ~~LOC~~ SOPN: 1.0000 mg | PEN_INJECTOR | SUBCUTANEOUS | 1 refills | Status: DC

## 2018-04-20 MED ORDER — AMLODIPINE BESYLATE 10 MG PO TABS: 10.0000 mg | ORAL_TABLET | Freq: Every day | ORAL | 0 refills | Status: DC

## 2018-04-20 NOTE — Progress Notes (Signed)
Name: [name]   MRN: [id number]    DOB: 07-31-1965   Date:04/20/2018       Progress Note  Subjective  Chief Complaint  Chief Complaint  Patient presents with  . Obesity    Would like to have forms filled out for work and for Tobacco use-Quit smoking in June 2019.     HPI  HTN: she has been compliant with medication now but she was skipping medication previously. She has LVH and was reminded of importance of keeping bp at goal. BP is well controlled today. No chest pain or palpitation   Pre-diabetes: she denies polyphagia, polyuria or polydipsia, she had labs done at [facility] and hgbA1C on 6.4% , she is on Ozempic weekly, no side effects of medication.   Urinary urgency: improved with medication and needs a refill  Obesity: she failed her BMI value at work, she is really considering bariatric surgery next year, currently on Ozempic but is not curbing her appetite. She has been drinking more lately. She thinks has gained because she was on vacation last week and has stopped smoking since last visit, therefore she has been snacking more  Tobacco cessation; she smoked a few cigarettes this past week, on Chantix and has not purchased a pack of cigarettes in months. Explained that I will fill forms for [facility] today but she needs to be completely tobacco free next screen cycle   Patient Active Problem List   Diagnosis Date Noted  . Ganglion cyst of dorsum of left wrist 02/05/2018  . LVH (left ventricular hypertrophy) due to hypertensive disease, without heart failure 09/23/2017  . SOBOE (shortness of breath on exertion) 09/23/2017  . Hyperlipidemia, mixed 08/28/2017  . Hyperglycemia 08/25/2017  . Hypercalcemia due to thiazide and vitamin A 08/25/2017  . Hypertension 08/18/2017  . GAD (generalized anxiety disorder) 08/18/2017  . GERD (gastroesophageal reflux disease) 08/18/2017  . Panic attack 08/18/2017  . Tobacco use 08/18/2017  . Snoring 08/18/2017  . Systolic ejection  murmur 08/18/2017    Past Surgical History:  Procedure Laterality Date  . ABDOMINAL HYSTERECTOMY     Complete Hysterectomy  . CHOLECYSTECTOMY    . GANGLION CYST EXCISION Left 03/01/2018   Procedure: REMOVAL GANGLION OF WRIST;  Surgeon: Earnestine Leys, MD;  Location: [facility] ORS;  Service: Orthopedics;  Laterality: Left;    Family History  Problem Relation Age of Onset  . Hypertension Mother   . Stroke Father   . Hypertension Father   . Hypertension Sister   . Breast cancer Neg Hx     Social History   Socioeconomic History  . Marital status: Married    Spouse name: [name]   . Number of children: 3  . Years of education: Not on file  . Highest education level: 12th grade  Occupational History  . Occupation: Furniture conservator/restorer   Social Needs  . Financial resource strain: Not hard at all  . Food insecurity:    Worry: Never true    Inability: Never true  . Transportation needs:    Medical: No    Non-medical: No  Tobacco Use  . Smoking status: Former Smoker    Years: 18.00    Types: Cigarettes    Start date: 08/19/1999    Last attempt to quit: 01/22/2018    Years since quitting: 0.2  . Smokeless tobacco: Never Used  . Tobacco comment: she smoked half pack for 18 years   Substance and Sexual Activity  . Alcohol use: Yes  Alcohol/week: 1.0 standard drinks    Types: 1 Glasses of wine per week  . Drug use: No  . Sexual activity: Yes    Partners: Male    Birth control/protection: Other-see comments    Comment: Hysterectomy  Lifestyle  . Physical activity:    Days per week: 5 days    Minutes per session: 20 min  . Stress: Rather much  Relationships  . Social connections:    Talks on phone: More than three times a week    Gets together: Twice a week    Attends religious service: More than 4 times per year    Active member of club or organization: Yes    Attends meetings of clubs or organizations: More than 4 times per year    Relationship status: Married  . Intimate  partner violence:    Fear of current or ex partner: No    Emotionally abused: No    Physically abused: No    Forced sexual activity: No  Other Topics Concern  . Not on file  Social History Narrative   Re-married since 2012   Only the two of them at home   She has three children from previous relationship and he has one   She works a full at [employer] and part time at [employer]    Her only son ( youngest child) went to prison for 3 years ( from 2014 - 2017), since than she has been anxious and scared of darkness.      Current Outpatient Medications:  .  amLODipine (NORVASC) 10 MG tablet, Take 1 tablet (10 mg total) by mouth daily., Disp: 30 tablet, Rfl: 0 .  aspirin EC 81 MG tablet, Take 162 mg by mouth daily as needed for moderate pain. , Disp: , Rfl:  .  CHANTIX CONTINUING MONTH PAK 1 MG tablet, Take 1 mg by mouth 2 (two) times daily., Disp: , Rfl: 2 .  gabapentin (NEURONTIN) 400 MG capsule, Take 1 capsule (400 mg total) by mouth 2 (two) times daily., Disp: 60 capsule, Rfl: 3 .  hydrALAZINE (APRESOLINE) 10 MG tablet, Take 1 tablet (10 mg total) by mouth 2 (two) times daily as needed. For bp above 150/90, Disp: 60 tablet, Rfl: 0 .  hydrochlorothiazide (HYDRODIURIL) 12.5 MG tablet, Take 1 tablet (12.5 mg total) by mouth daily., Disp: 90 tablet, Rfl: 0 .  meloxicam (MOBIC) 15 MG tablet, Take 1 tablet (15 mg total) by mouth daily., Disp: 30 tablet, Rfl: 3 .  ranitidine (ZANTAC) 300 MG tablet, Take 1 tablet (300 mg total) by mouth daily as needed for heartburn., Disp: 90 tablet, Rfl: 0 .  Semaglutide (OZEMPIC) 1 MG/DOSE SOPN, Inject 1 mg into the skin once a week., Disp: 27 mL, Rfl: 1 .  solifenacin (VESICARE) 10 MG tablet, Take 1 tablet (10 mg total) by mouth daily., Disp: 90 tablet, Rfl: 1 .  atorvastatin (LIPITOR) 20 MG tablet, Take 1 tablet (20 mg total) by mouth daily. (Patient not taking: Reported on 04/20/2018), Disp: 90 tablet, Rfl: 1  Allergies  Allergen Reactions   . Penicillins Itching    Has patient had a PCN reaction causing immediate rash, facial/tongue/throat swelling, SOB or lightheadedness with hypotension: No Has patient had a PCN reaction causing severe rash involving mucus membranes or skin necrosis: No Has patient had a PCN reaction that required hospitalization: No Has patient had a PCN reaction occurring within the last 10 years: No If all of the above answers are "NO",  then may proceed with Cephalosporin use.     I personally reviewed active problem list, medication list, allergies, family history, social history with the patient/caregiver today.   ROS  Constitutional: Negative for fever or weight change.  Respiratory: Negative for cough and shortness of breath.   Cardiovascular: Negative for chest pain or palpitations.  Gastrointestinal: Negative for abdominal pain, no bowel changes.  Musculoskeletal: Negative for gait problem or joint swelling.  Skin: Negative for rash.  Neurological: Negative for dizziness or headache.  No other specific complaints in a complete review of systems (except as listed in HPI above).  Objective  Vitals:   04/20/18 1501  BP: 136/72  Pulse: 97  Resp: 16  Temp: 98.4 F (36.9 C)  TempSrc: Oral  SpO2: 99%  Weight: 187 lb 12.8 oz (85.2 kg)  Height: '5\' 2"'  (1.575 m)    Body mass index is 34.35 kg/m.  Physical Exam  Constitutional: Patient appears well-developed and well-nourished. Obese No distress.  HEENT: head atraumatic, normocephalic, pupils equal and reactive to light, neck supple, throat within normal limits Cardiovascular: Normal rate, regular rhythm and normal heart sounds.  No murmur heard. No BLE edema. Pulmonary/Chest: Effort normal and breath sounds normal. No respiratory distress. Abdominal: Soft.  There is no tenderness. Psychiatric: Patient has a normal mood and affect. behavior is normal. Judgment and thought content normal.  Recent Results (from the past 2160 hour(s))   HIV antibody (with reflex)     Status: None   Collection Time: 02/15/18  9:26 AM  Result Value Ref Range   HIV Screen 4th Generation wRfx Non Reactive Non Reactive  CBC with Differential/Platelet     Status: None   Collection Time: 02/15/18  9:26 AM  Result Value Ref Range   WBC 6.1 3.4 - 10.8 x10E3/uL   RBC 5.22 3.77 - 5.28 x10E6/uL   Hemoglobin 14.1 11.1 - 15.9 g/dL   Hematocrit 43.2 34.0 - 46.6 %   MCV 83 79 - 97 fL   MCH 27.0 26.6 - 33.0 pg   MCHC 32.6 31.5 - 35.7 g/dL   RDW 13.9 12.3 - 15.4 %   Platelets 289 150 - 450 x10E3/uL   Neutrophils 46 Not Estab. %   Lymphs 45 Not Estab. %   Monocytes 7 Not Estab. %   Eos 2 Not Estab. %   Basos 0 Not Estab. %   Neutrophils Absolute 2.8 1.4 - 7.0 x10E3/uL   Lymphocytes Absolute 2.8 0.7 - 3.1 x10E3/uL   Monocytes Absolute 0.4 0.1 - 0.9 x10E3/uL   EOS (ABSOLUTE) 0.1 0.0 - 0.4 x10E3/uL   Basophils Absolute 0.0 0.0 - 0.2 x10E3/uL   Immature Granulocytes 0 Not Estab. %   Immature Grans (Abs) 0.0 0.0 - 0.1 x10E3/uL  HgB A1c     Status: Abnormal   Collection Time: 02/15/18  9:26 AM  Result Value Ref Range   Hgb A1c MFr Bld 6.1 (H) 4.8 - 5.6 %    Comment:          Prediabetes: 5.7 - 6.4          Diabetes: >6.4          Glycemic control for adults with diabetes: <7.0    Est. average glucose Bld gHb Est-mCnc 128 mg/dL  CMP14+EGFR     Status: Abnormal   Collection Time: 02/15/18  9:26 AM  Result Value Ref Range   Glucose 97 65 - 99 mg/dL   BUN 18 6 - 24 mg/dL   Creatinine,  Ser 0.79 0.57 - 1.00 mg/dL   GFR calc non Af Amer 88 >59 mL/min/1.73   GFR calc Af Amer 101 >59 mL/min/1.73   BUN/Creatinine Ratio 23 9 - 23   Sodium 141 134 - 144 mmol/L   Potassium 4.1 3.5 - 5.2 mmol/L   Chloride 102 96 - 106 mmol/L   CO2 23 20 - 29 mmol/L   Calcium 10.2 8.7 - 10.2 mg/dL   Total Protein 7.5 6.0 - 8.5 g/dL   Albumin 5.0 3.5 - 5.5 g/dL   Globulin, Total 2.5 1.5 - 4.5 g/dL   Albumin/Globulin Ratio 2.0 1.2 - 2.2   Bilirubin Total 0.3 0.0 -  1.2 mg/dL   Alkaline Phosphatase 65 39 - 117 IU/L   AST 23 0 - 40 IU/L   ALT 35 (H) 0 - 32 IU/L  Surgical pathology     Status: None   Collection Time: 03/01/18  9:25 AM  Result Value Ref Range   SURGICAL PATHOLOGY      Surgical Pathology CASE: [id number] PATIENT: Sheng Glockner Surgical Pathology Report     SPECIMEN SUBMITTED: A. Ganglion, left wrist  CLINICAL HISTORY: None provided  PRE-OPERATIVE DIAGNOSIS: Ganglion cyst of left dorsal  POST-OPERATIVE DIAGNOSIS: Same as pre op     DIAGNOSIS: A. SOFT TISSUE, LEFT DORSAL WRIST; EXCISION: - GANGLION CYST.   GROSS DESCRIPTION: A. Labeled: Left ganglion cyst Received: In formalin Tissue fragment(s): 1 Size: 1.8 x 1.0 x 0.5 cm Description: Pink-tan irregularly-shaped fibrous fragment of tissue, marked blue and bisected, focally cystic Entirely submitted in one cassette.     Final Diagnosis performed by Bryan Lemma, MD.   Electronically signed 03/02/2018 8:50:04PM The electronic signature indicates that the named Attending Pathologist has evaluated the specimen  Technical component performed at [facility], [address], [place], [place] [address] Lab: [phone] Dir: Rush Farmer, MD, MMM  Professional component  performed at [facility], [facility], [place], [place], [place] [address] Lab: [phone] Dir: Dellia Nims. Rubinas, MD       PHQ2/9: Depression screen [facility] 2/9 04/20/2018 02/10/2018 12/09/2017 08/18/2017  Decreased Interest 0 0 0 0  Down, Depressed, Hopeless 0 0 0 0  PHQ - 2 Score 0 0 0 0    Fall Risk: Fall Risk  04/20/2018 02/10/2018 12/09/2017 10/27/2017 08/18/2017  Falls in the past year? No No No No No    Functional Status Survey: Is the patient deaf or have difficulty hearing?: No Does the patient have difficulty seeing, even when wearing glasses/contacts?: Yes(glasses) Does the patient have difficulty concentrating, remembering, or making decisions?: No Does the  patient have difficulty walking or climbing stairs?: No Does the patient have difficulty dressing or bathing?: No Does the patient have difficulty doing errands alone such as visiting a doctor's office or shopping?: No    Assessment & Plan  1. Essential hypertension  - hydrochlorothiazide (HYDRODIURIL) 12.5 MG tablet; Take 1 tablet (12.5 mg total) by mouth daily.  Dispense: 90 tablet; Refill: 0 - amLODipine (NORVASC) 10 MG tablet; Take 1 tablet (10 mg total) by mouth daily.  Dispense: 90 tablet; Refill: 0  2. Hypertensive left ventricular hypertrophy, without heart failure   3. Obesity (BMI 30.0-34.9)  - Amb Referral to Bariatric Surgery  4. Pre-diabetes  - Semaglutide, 1 MG/DOSE, (OZEMPIC, 1 MG/DOSE,) 2 MG/1.5ML SOPN; Inject 1 mg into the skin once a week.  Dispense: 27 mL; Refill: 1  5. Hyperlipidemia, mixed  She will resume atorvastatin   6. Needs flu shot  -  Flu Vaccine QUAD 6+ mos PF IM (Fluarix Quad PF)

## 2018-08-02 ENCOUNTER — Ambulatory Visit: Payer: Managed Care, Other (non HMO) | Admitting: Family Medicine

## 2018-08-27 ENCOUNTER — Ambulatory Visit (INDEPENDENT_AMBULATORY_CARE_PROVIDER_SITE_OTHER): Payer: Managed Care, Other (non HMO) | Admitting: Family Medicine

## 2018-08-27 ENCOUNTER — Encounter: Payer: Self-pay | Admitting: Family Medicine

## 2018-08-27 VITALS — BP 150/90 | HR 97 | Temp 98.5°F | Resp 16 | Ht 62.0 in | Wt 180.6 lb

## 2018-08-27 DIAGNOSIS — I10 Essential (primary) hypertension: Secondary | ICD-10-CM

## 2018-08-27 DIAGNOSIS — E669 Obesity, unspecified: Secondary | ICD-10-CM

## 2018-08-27 DIAGNOSIS — I119 Hypertensive heart disease without heart failure: Secondary | ICD-10-CM | POA: Diagnosis not present

## 2018-08-27 DIAGNOSIS — K219 Gastro-esophageal reflux disease without esophagitis: Secondary | ICD-10-CM | POA: Diagnosis not present

## 2018-08-27 DIAGNOSIS — M5412 Radiculopathy, cervical region: Secondary | ICD-10-CM

## 2018-08-27 DIAGNOSIS — R7303 Prediabetes: Secondary | ICD-10-CM | POA: Diagnosis not present

## 2018-08-27 DIAGNOSIS — L659 Nonscarring hair loss, unspecified: Secondary | ICD-10-CM

## 2018-08-27 DIAGNOSIS — Z720 Tobacco use: Secondary | ICD-10-CM

## 2018-08-27 MED ORDER — CHANTIX CONTINUING MONTH PAK 1 MG PO TABS: 1.0000 mg | ORAL_TABLET | Freq: Two times a day (BID) | ORAL | 2 refills | Status: DC

## 2018-08-27 MED ORDER — HYDROCHLOROTHIAZIDE 12.5 MG PO TABS: 12.5000 mg | ORAL_TABLET | Freq: Every day | ORAL | 0 refills | Status: DC

## 2018-08-27 MED ORDER — PREDNISONE 10 MG (48) PO TBPK: ORAL_TABLET | ORAL | 0 refills | Status: DC

## 2018-08-27 NOTE — Progress Notes (Signed)
Name: [name]   MRN: [id number]    DOB: 02/12/1966   Date:08/27/2018       Progress Note  Subjective  Chief Complaint  Chief Complaint  Patient presents with  . Medication Refill    3 month F/U  . Hypertension  . Obesity  . Pre-diabetes  . Tobacco cessation  . Neck Pain    pain radiates to her shoulder, she was seen and [facility] at [facility]. She continues to have pain usually at night that keeps her awake.    HPI  HTN: she has been compliant with medication now but she was skipping medication previously. She has LVH and was reminded of importance of keeping bp at goal. BP is high today, but states 130's/80's at other offices. She did not take phentermine today, last dose of Norvasc was last night, but skipped HCTZ   Neck pain: she went to Urgent Care about one month ago, she woke up with neck pain one day and it did not improve so she went to Urgent care and was given hydrocodone, naproxen and flexeril, pain is not as severe but still bother's her at night. , she states it radiates it goes down to right hand, no weakness. Explained she may have radiculitis.   GERD: she states only flaring with dietary indiscretion. She is taking Tums prn, she needs refill of Ranitidine   Pre-diabetes: she denies polyphagia, polyuria or polydipsia, she had labs done at [facility] and hgbA1C on 6.4% , we started her on Ozempic but she stopped on her own, she states she used for about 2 months. She joined the weight watch clinic and is losing weight with phentermine   Urinary urgency: stable with medication, taking medication occasionally   Obesity: she failed her BMI value at work, she wanted to have bariatric surgery but not approved by insurance, she is now going to the bariatric clinic and is taking phentermine for past month and has lost 8 lbs since. She has also stopped drinking sodas, no fried food.   Tobacco cessation; she smoked a few cigarettes this past week, on Chantix but ran  out and is back smoking 5 cigarettes daily , we will resume chantix.   Patient Active Problem List   Diagnosis Date Noted  . Ganglion cyst of dorsum of left wrist 02/05/2018  . LVH (left ventricular hypertrophy) due to hypertensive disease, without heart failure 09/23/2017  . SOBOE (shortness of breath on exertion) 09/23/2017  . Hyperlipidemia, mixed 08/28/2017  . Hyperglycemia 08/25/2017  . Hypercalcemia due to thiazide and vitamin A 08/25/2017  . Hypertension 08/18/2017  . GAD (generalized anxiety disorder) 08/18/2017  . GERD (gastroesophageal reflux disease) 08/18/2017  . Panic attack 08/18/2017  . Tobacco use 08/18/2017  . Snoring 08/18/2017  . Systolic ejection murmur 08/18/2017    Past Surgical History:  Procedure Laterality Date  . ABDOMINAL HYSTERECTOMY     Complete Hysterectomy  . CHOLECYSTECTOMY    . GANGLION CYST EXCISION Left 03/01/2018   Procedure: REMOVAL GANGLION OF WRIST;  Surgeon: Deeann SaintMiller, Howard, MD;  Location: [facility] ORS;  Service: Orthopedics;  Laterality: Left;    Family History  Problem Relation Age of Onset  . Hypertension Mother   . Stroke Father   . Hypertension Father   . Hypertension Sister   . Breast cancer Neg Hx     Social History   Socioeconomic History  . Marital status: Married    Spouse name: [name]   . Number of children: 3  .  Years of education: Not on file  . Highest education level: 12th grade  Occupational History  . Occupation: Acupuncturistmedicare audits   Social Needs  . Financial resource strain: Not hard at all  . Food insecurity:    Worry: Never true    Inability: Never true  . Transportation needs:    Medical: No    Non-medical: No  Tobacco Use  . Smoking status: Former Smoker    Packs/day: 0.25    Years: 18.00    Pack years: 4.50    Types: Cigarettes    Start date: 08/19/1999  . Smokeless tobacco: Never Used  . Tobacco comment: she smoked half pack for 18 years , quit in 12/2017 but back smoking 0.25 pack daily  Substance  and Sexual Activity  . Alcohol use: Yes    Alcohol/week: 1.0 standard drinks    Types: 1 Glasses of wine per week  . Drug use: No  . Sexual activity: Yes    Partners: Male    Birth control/protection: Other-see comments    Comment: Hysterectomy  Lifestyle  . Physical activity:    Days per week: 5 days    Minutes per session: 20 min  . Stress: Rather much  Relationships  . Social connections:    Talks on phone: More than three times a week    Gets together: Twice a week    Attends religious service: More than 4 times per year    Active member of club or organization: Yes    Attends meetings of clubs or organizations: More than 4 times per year    Relationship status: Married  . Intimate partner violence:    Fear of current or ex partner: No    Emotionally abused: No    Physically abused: No    Forced sexual activity: No  Other Topics Concern  . 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      Current Outpatient Medications:  .  amLODipine (NORVASC) 10 MG tablet, Take 1 tablet (10 mg total) by mouth daily., Disp: 90 tablet, Rfl: 0 .  aspirin EC 81 MG tablet, Take 162 mg by mouth daily as needed for moderate pain. , Disp: , Rfl:  .  atorvastatin (LIPITOR) 20 MG tablet, Take 1 tablet (20 mg total) by mouth daily., Disp: 90 tablet, Rfl: 1 .  CHANTIX CONTINUING MONTH PAK 1 MG tablet, Take 1 tablet (1 mg total) by mouth 2 (two) times daily., Disp: 30 tablet, Rfl: 2 .  gabapentin (NEURONTIN) 400 MG capsule, Take 1 capsule (400 mg total) by mouth 2 (two) times daily., Disp: 60 capsule, Rfl: 3 .  hydrALAZINE (APRESOLINE) 10 MG tablet, Take 1 tablet (10 mg  total) by mouth 2 (two) times daily as needed. For bp above 150/90, Disp: 60 tablet, Rfl: 0 .  hydrochlorothiazide (HYDRODIURIL) 12.5 MG tablet, Take 1 tablet (12.5 mg total) by mouth daily., Disp: 90 tablet, Rfl: 0 .  HYDROcodone-acetaminophen (NORCO/VICODIN) 5-325 MG tablet, Take 1 tablet by mouth daily as needed., Disp: , Rfl:  .  ranitidine (ZANTAC) 300 MG tablet,  Take 1 tablet (300 mg total) by mouth daily as needed for heartburn., Disp: 90 tablet, Rfl: 0 .  cyclobenzaprine (FLEXERIL) 5 MG tablet, Take 5 mg by mouth 3 (three) times daily as needed. for muscle spams, Disp: , Rfl:  .  predniSONE (STERAPRED UNI-PAK 48 TAB) 10 MG (48) TBPK tablet, Take as directed . Avoid nsaids and take with food, Disp: 48 tablet, Rfl: 0 .  solifenacin (VESICARE) 10 MG tablet, Take 1 tablet (10 mg total) by mouth daily., Disp: 90 tablet, Rfl: 1  Allergies  Allergen Reactions  . Penicillins Itching    Has patient had a PCN reaction causing immediate rash, facial/tongue/throat swelling, SOB or lightheadedness with hypotension: No Has patient had a PCN reaction causing severe rash involving mucus membranes or skin necrosis: No Has patient had a PCN reaction that required hospitalization: No Has patient had a PCN reaction occurring within the last 10 years: No If all of the above answers are "NO", then may proceed with Cephalosporin use.     I personally reviewed active problem list, medication list, allergies, family history, social history with the patient/caregiver today.   ROS  Constitutional: Negative for fever , positive for  weight change.  Respiratory: Negative for cough and shortness of breath.   Cardiovascular: Negative for chest pain or palpitations.  Gastrointestinal: Negative for abdominal pain, no bowel changes.  Musculoskeletal: Negative for gait problem or joint swelling.  Skin: Negative for rash.  Neurological: Negative for dizziness or headache.  No other specific complaints in a  complete review of systems (except as listed in HPI above).  Objective  Vitals:   08/27/18 1015  BP: (!) 150/90  Pulse: 97  Resp: 16  Temp: 98.5 F (36.9 C)  TempSrc: Oral  SpO2: 99%  Weight: 180 lb 9.6 oz (81.9 kg)  Height: 5\' 2"  (1.575 m)    Body mass index is 33.03 kg/m.  Physical Exam  Constitutional: Patient appears well-developed and well-nourished. Obese No distress.  HEENT: head atraumatic, normocephalic, pupils equal and reactive to light,  neck supple, normal ROM but has pain , throat within normal limits. Hair loss - familial  Cardiovascular: Normal rate, regular rhythm and normal heart sounds.  No murmur heard. No BLE edema. Pulmonary/Chest: Effort normal and breath sounds normal. No respiratory distress. Abdominal: Soft.  There is no tenderness. Psychiatric: Patient has a normal mood and affect. behavior is normal. Judgment and thought content normal.  PHQ2/9: Depression screen [facility] 2/9 08/27/2018 04/20/2018 02/10/2018 12/09/2017 08/18/2017  Decreased Interest 0 0 0 0 0  Down, Depressed, Hopeless 0 0 0 0 0  PHQ - 2 Score 0 0 0 0 0     Fall Risk: Fall Risk  08/27/2018 04/20/2018 02/10/2018 12/09/2017 10/27/2017  Falls in the past year? 0 No No No No  Number falls in past yr: 0 - - - -  Injury with Fall? 0 - - - -     Assessment & Plan  1. Essential hypertension  BP high when she came in, she has been on phentermine, but states bp at the weight loss clinic has been within normal limits. Discussed the risk of stimulants in patients with hypertension - hydrochlorothiazide (HYDRODIURIL) 12.5 MG tablet; Take 1 tablet (12.5 mg total) by mouth daily.  Dispense: 90 tablet; Refill: 0  2. Gastroesophageal reflux disease without esophagitis  On Ranitidine and tums   3. Hypertensive left ventricular hypertrophy, without heart failure  Needs to get bp at goal   4. Pre-diabetes  She stopped Ozempic on her own  5. Obesity (BMI 30.0-34.9)  Going to bariatric center,  taking phentermine, states Ozempic did not curb her appetite, no Phentermine now   6. Tobacco use  She has been off chantix and is smoking 5 cigarettes a day but had quit while on the medication and wants to resume it  - CHANTIX CONTINUING MONTH PAK 1 MG tablet; Take 1 tablet (1 mg total) by mouth 2 (two) times daily.  Dispense: 30 tablet; Refill: 2   7. Cervical radiculitis  - predniSONE (STERAPRED UNI-PAK 48 TAB) 10 MG (48) TBPK tablet; Take as directed . Avoid nsaids and take with food  Dispense: 48 tablet; Refill: 0  Discussed side effects of medication   8. Alopecia  She states her mother and sister have same symptoms, she does not want to see dermatologist

## 2018-09-06 ENCOUNTER — Other Ambulatory Visit: Payer: Self-pay | Admitting: Family Medicine

## 2018-09-06 DIAGNOSIS — Z1231 Encounter for screening mammogram for malignant neoplasm of breast: Secondary | ICD-10-CM

## 2018-09-30 ENCOUNTER — Ambulatory Visit
Admission: RE | Admit: 2018-09-30 | Discharge: 2018-09-30 | Disposition: A | Discharge status: Home / Self Care | Payer: Managed Care, Other (non HMO) | Source: Ambulatory Visit | Attending: Family Medicine | Admitting: Family Medicine

## 2018-09-30 DIAGNOSIS — Z1231 Encounter for screening mammogram for malignant neoplasm of breast: Secondary | ICD-10-CM | POA: Diagnosis present

## 2018-11-26 ENCOUNTER — Ambulatory Visit (INDEPENDENT_AMBULATORY_CARE_PROVIDER_SITE_OTHER): Payer: Managed Care, Other (non HMO) | Admitting: Family Medicine

## 2018-11-26 ENCOUNTER — Encounter: Payer: Self-pay | Admitting: Family Medicine

## 2018-11-26 ENCOUNTER — Other Ambulatory Visit: Payer: Self-pay

## 2018-11-26 VITALS — BP 170/110 | HR 88 | Wt 178.0 lb

## 2018-11-26 DIAGNOSIS — I10 Essential (primary) hypertension: Secondary | ICD-10-CM | POA: Diagnosis not present

## 2018-11-26 DIAGNOSIS — K219 Gastro-esophageal reflux disease without esophagitis: Secondary | ICD-10-CM

## 2018-11-26 DIAGNOSIS — Z716 Tobacco abuse counseling: Secondary | ICD-10-CM

## 2018-11-26 DIAGNOSIS — I119 Hypertensive heart disease without heart failure: Secondary | ICD-10-CM

## 2018-11-26 DIAGNOSIS — E7800 Pure hypercholesterolemia, unspecified: Secondary | ICD-10-CM | POA: Diagnosis not present

## 2018-11-26 DIAGNOSIS — N3941 Urge incontinence: Secondary | ICD-10-CM

## 2018-11-26 DIAGNOSIS — R7303 Prediabetes: Secondary | ICD-10-CM

## 2018-11-26 DIAGNOSIS — E669 Obesity, unspecified: Secondary | ICD-10-CM

## 2018-11-26 MED ORDER — ATORVASTATIN CALCIUM 20 MG PO TABS: 20.0000 mg | ORAL_TABLET | Freq: Every day | ORAL | 1 refills | Status: DC

## 2018-11-26 MED ORDER — FAMOTIDINE 40 MG PO TABS: 40.0000 mg | ORAL_TABLET | Freq: Every day | ORAL | 1 refills | Status: DC | PRN

## 2018-11-26 MED ORDER — HYDROCHLOROTHIAZIDE 12.5 MG PO TABS: 12.5000 mg | ORAL_TABLET | Freq: Every day | ORAL | 1 refills | Status: DC

## 2018-11-26 MED ORDER — SEMAGLUTIDE 3 MG PO TABS: 1.0000 | ORAL_TABLET | Freq: Every day | ORAL | 0 refills | Status: DC

## 2018-11-26 MED ORDER — CHANTIX CONTINUING MONTH PAK 1 MG PO TABS: 1.0000 mg | ORAL_TABLET | Freq: Two times a day (BID) | ORAL | 2 refills | Status: DC

## 2018-11-26 MED ORDER — AMLODIPINE BESYLATE 10 MG PO TABS: 10.0000 mg | ORAL_TABLET | Freq: Every day | ORAL | 1 refills | Status: DC

## 2018-11-26 MED ORDER — HYDRALAZINE HCL 10 MG PO TABS: 10.0000 mg | ORAL_TABLET | Freq: Two times a day (BID) | ORAL | 0 refills | Status: DC | PRN

## 2018-11-26 NOTE — Progress Notes (Signed)
Name: [name]   MRN: [id number]    DOB: 07/16/1966   Date:11/26/2018       Progress Note  Subjective  Chief Complaint  No chief complaint on file.   I connected with  Chelsea ConverseJuliet B Hofacker  on 11/26/18 at  1:40 PM EDT by a video enabled telemedicine application and verified that I am speaking with the correct person using two identifiers.  I discussed the limitations of evaluation and management by telemedicine and the availability of in person appointments. The patient expressed understanding and agreed to proceed. Staff also discussed with the patient that there may be a patient responsible charge related to this service. Patient Location: inside her car but parked by herself Provider Location: [facility]   HPI  HTN:she stopped taking bp medication about one week ago because she quit smoking over 3 weeks and felt that she did not need it anymore. However she checked bp today and it was high, so she took medication about one hour ago, she denies headaches, dizziness or palpitation. . She has LVH and was reminded of importance of keeping bp at goal.   GERD: she states only flaring with dietary indiscretion. She is taking Tums prn, she needs refill o H2 blocker we will switch to Pepcid from Ranitidine to take prn   Pre-diabetes: she denies polyphagia, polyuria or polydipsia, she had labs done at [facility] and hgbA1C on 6.4% , we started her on Ozempic but she stopped on her own, she is willing to try oral medication , we will try Rybelsus . She is no longer taking phentermine but stopped going to the weight loss clinic because of cost and has been able to maintain her weight. She has been walking 2 miles three times a week  Urinary urgency: stable with medication, taking medication occasionally   Obesity: she failed her BMI value at work, she wanted to have bariatric surgery but not approved by insurance, she went to  the bariatric clinic and was taking phentermine and  lost about 8 lbs, we will try Rybelsus and monitor.     Patient Active Problem List   Diagnosis Date Noted  . Ganglion cyst of dorsum of left wrist 02/05/2018  . LVH (left ventricular hypertrophy) due to hypertensive disease, without heart failure 09/23/2017  . SOBOE (shortness of breath on exertion) 09/23/2017  . Hyperlipidemia, mixed 08/28/2017  . Hyperglycemia 08/25/2017  . Hypercalcemia due to thiazide and vitamin A 08/25/2017  . Hypertension 08/18/2017  . GAD (generalized anxiety disorder) 08/18/2017  . GERD (gastroesophageal reflux disease) 08/18/2017  . Panic attack 08/18/2017  . Tobacco use 08/18/2017  . Snoring 08/18/2017  . Systolic ejection murmur 08/18/2017    Past Surgical History:  Procedure Laterality Date  . ABDOMINAL HYSTERECTOMY     Complete Hysterectomy  . CHOLECYSTECTOMY    . GANGLION CYST EXCISION Left 03/01/2018   Procedure: REMOVAL GANGLION OF WRIST;  Surgeon: Deeann SaintMiller, Howard, MD;  Location: [facility] ORS;  Service: Orthopedics;  Laterality: Left;    Family History  Problem Relation Age of Onset  . Hypertension Mother   . Stroke Father   . Hypertension Father   . Hypertension Sister   . Breast cancer Neg Hx     Social History   Socioeconomic History  . Marital status: Married    Spouse name: [name]   . Number of children: 3  . Years of education: Not on file  . Highest education level: 12th grade  Occupational History  .  Occupation: Acupuncturist   Social Needs  . Financial resource strain: Not hard at all  . Food insecurity:    Worry: Never true    Inability: Never true  . Transportation needs:    Medical: No    Non-medical: No  Tobacco Use  . Smoking status: Former Smoker    Packs/day: 0.25    Years: 18.00    Pack years: 4.50    Types: Cigarettes    Start date: 08/19/1999  . Smokeless tobacco: Never Used  . Tobacco comment: she smoked half pack for 18 years , quit in 12/2017 but back smoking 0.25 pack daily  Substance and Sexual  Activity  . Alcohol use: Yes    Alcohol/week: 1.0 standard drinks    Types: 1 Glasses of wine per week  . Drug use: No  . Sexual activity: Yes    Partners: Male    Birth control/protection: Other-see comments    Comment: Hysterectomy  Lifestyle  . Physical activity:    Days per week: 5 days    Minutes per session: 20 min  . Stress: Rather much  Relationships  . Social connections:    Talks on phone: More than three times a week    Gets together: Twice a week    Attends religious service: More than 4 times per year    Active member of club or organization: Yes    Attends meetings of clubs or organizations: More than 4 times per year    Relationship status: Married  . Intimate partner violence:    Fear of current or ex partner: No    Emotionally abused: No    Physically abused: No    Forced sexual activity: No  Other Topics Concern  . Not on file  Social History Narrative   Re-married since 2012   Only the two of them at home   She has three children from previous relationship and he has one   She works a full at [employer] and part time at [employer]    Her only son ( youngest child) went to prison for 3 years ( from 2014 - 2017), since than she has been anxious and scared of darkness.      Current Outpatient Medications:  .  amLODipine (NORVASC) 10 MG tablet, Take 1 tablet (10 mg total) by mouth daily., Disp: 90 tablet, Rfl: 0 .  aspirin EC 81 MG tablet, Take 162 mg by mouth daily as needed for moderate pain. , Disp: , Rfl:  .  atorvastatin (LIPITOR) 20 MG tablet, Take 1 tablet (20 mg total) by mouth daily., Disp: 90 tablet, Rfl: 1 .  CHANTIX CONTINUING MONTH PAK 1 MG tablet, Take 1 tablet (1 mg total) by mouth 2 (two) times daily., Disp: 30 tablet, Rfl: 2 .  cyclobenzaprine (FLEXERIL) 5 MG tablet, Take 5 mg by mouth 3 (three) times daily as needed. for muscle spams, Disp: , Rfl:  .  gabapentin (NEURONTIN) 400 MG capsule, Take 1 capsule (400 mg total) by  mouth 2 (two) times daily., Disp: 60 capsule, Rfl: 3 .  hydrALAZINE (APRESOLINE) 10 MG tablet, Take 1 tablet (10 mg total) by mouth 2 (two) times daily as needed. For bp above 150/90, Disp: 60 tablet, Rfl: 0 .  hydrochlorothiazide (HYDRODIURIL) 12.5 MG tablet, Take 1 tablet (12.5 mg total) by mouth daily., Disp: 90 tablet, Rfl: 0 .  HYDROcodone-acetaminophen (NORCO/VICODIN) 5-325 MG tablet, Take 1 tablet by mouth daily as needed., Disp: , Rfl:  .  ranitidine (ZANTAC) 300 MG tablet, Take 1 tablet (300 mg total) by mouth daily as needed for heartburn., Disp: 90 tablet, Rfl: 0 .  predniSONE (STERAPRED UNI-PAK 48 TAB) 10 MG (48) TBPK tablet, Take as directed . Avoid nsaids and take with food (Patient not taking: Reported on 11/26/2018), Disp: 48 tablet, Rfl: 0 .  solifenacin (VESICARE) 10 MG tablet, Take 1 tablet (10 mg total) by mouth daily. (Patient not taking: Reported on 11/26/2018), Disp: 90 tablet, Rfl: 1  Allergies  Allergen Reactions  . Penicillins Itching    Has patient had a PCN reaction causing immediate rash, facial/tongue/throat swelling, SOB or lightheadedness with hypotension: No Has patient had a PCN reaction causing severe rash involving mucus membranes or skin necrosis: No Has patient had a PCN reaction that required hospitalization: No Has patient had a PCN reaction occurring within the last 10 years: No If all of the above answers are "NO", then may proceed with Cephalosporin use.     I personally reviewed active problem list, medication list, allergies, family history, social history with the patient/caregiver today.   ROS  Ten systems reviewed and is negative except as mentioned in HPI   Objective  Virtual encounter, vitals obtained at home, she will stop by for a recheck   There is no height or weight on file to calculate BMI.  Physical Exam  Awake, alert and oriented, well groomed and in no distress   PHQ2/9: Depression screen [facility] 2/9 11/26/2018 08/27/2018 04/20/2018  02/10/2018 12/09/2017  Decreased Interest 0 0 0 0 0  Down, Depressed, Hopeless 0 0 0 0 0  PHQ - 2 Score 0 0 0 0 0  Altered sleeping 0 - - - -  Tired, decreased energy 0 - - - -  Change in appetite 0 - - - -  Feeling bad or failure about yourself  0 - - - -  Trouble concentrating 0 - - - -  Moving slowly or fidgety/restless 0 - - - -  Suicidal thoughts 0 - - - -  PHQ-9 Score 0 - - - -   PHQ-2/9 Result is negative.    Fall Risk: Fall Risk  11/26/2018 08/27/2018 04/20/2018 02/10/2018 12/09/2017  Falls in the past year? 0 0 No No No  Number falls in past yr: 0 0 - - -  Injury with Fall? 0 0 - - -     Assessment & Plan  1. Essential hypertension  - amLODipine (NORVASC) 10 MG tablet; Take 1 tablet (10 mg total) by mouth daily.  Dispense: 90 tablet; Refill: 1 - hydrochlorothiazide (HYDRODIURIL) 12.5 MG tablet; Take 1 tablet (12.5 mg total) by mouth daily.  Dispense: 90 tablet; Refill: 1  2. Pure hypercholesterolemia  - atorvastatin (LIPITOR) 20 MG tablet; Take 1 tablet (20 mg total) by mouth daily.  Dispense: 90 tablet; Refill: 1  3. Gastroesophageal reflux disease without esophagitis  Pepcid  4. Tobacco cessation   - CHANTIX CONTINUING MONTH PAK 1 MG tablet; Take 1 tablet (1 mg total) by mouth 2 (two) times daily.  Dispense: 30 tablet; Refill: 2  Continue medication, she states she relapsed in the past and would like to take mediation a little longer  5. Hypertensive left ventricular hypertrophy, without heart failure  Discussed importance of compliance with medication again   6. Pre-diabetes  - Semaglutide (RYBELSUS) 3 MG TABS; Take 1 tablet by mouth daily.  Dispense: 30 tablet; Refill: 0  7. Obesity (BMI 30.0-34.9)  - Semaglutide (RYBELSUS) 3 MG TABS;  Take 1 tablet by mouth daily.  Dispense: 30 tablet; Refill: 0  8. Urge incontinence of urine  Doing well at this time  I discussed the assessment and treatment plan with the patient. The patient was provided an  opportunity to ask questions and all were answered. The patient agreed with the plan and demonstrated an understanding of the instructions.  The patient was advised to call back or seek an in-person evaluation if the symptoms worsen or if the condition fails to improve as anticipated.  I provided 25 minutes of non-face-to-face time during this encounter.

## 2018-12-22 ENCOUNTER — Ambulatory Visit (INDEPENDENT_AMBULATORY_CARE_PROVIDER_SITE_OTHER): Payer: Managed Care, Other (non HMO) | Admitting: Family Medicine

## 2018-12-22 ENCOUNTER — Encounter: Payer: Self-pay | Admitting: Family Medicine

## 2018-12-22 ENCOUNTER — Other Ambulatory Visit: Payer: Self-pay

## 2018-12-22 VITALS — BP 182/110 | HR 97 | Temp 98.2°F | Resp 12 | Ht 62.0 in | Wt 184.5 lb

## 2018-12-22 DIAGNOSIS — I10 Essential (primary) hypertension: Secondary | ICD-10-CM

## 2018-12-22 DIAGNOSIS — I119 Hypertensive heart disease without heart failure: Secondary | ICD-10-CM | POA: Diagnosis not present

## 2018-12-22 DIAGNOSIS — R0789 Other chest pain: Secondary | ICD-10-CM

## 2018-12-22 DIAGNOSIS — R739 Hyperglycemia, unspecified: Secondary | ICD-10-CM

## 2018-12-22 DIAGNOSIS — E669 Obesity, unspecified: Secondary | ICD-10-CM | POA: Diagnosis not present

## 2018-12-22 DIAGNOSIS — E7800 Pure hypercholesterolemia, unspecified: Secondary | ICD-10-CM

## 2018-12-22 DIAGNOSIS — E66811 Obesity, class 1: Secondary | ICD-10-CM

## 2018-12-22 MED ORDER — HYDROCHLOROTHIAZIDE 25 MG PO TABS: 12.5000 mg | ORAL_TABLET | Freq: Two times a day (BID) | ORAL | 0 refills | Status: DC

## 2018-12-22 MED ORDER — HYDRALAZINE HCL 10 MG PO TABS: 10.0000 mg | ORAL_TABLET | Freq: Three times a day (TID) | ORAL | 0 refills | Status: DC

## 2018-12-22 NOTE — Progress Notes (Signed)
Name: [name]   MRN: [id number]    DOB: Sep 26, 1965   Date:12/22/2018       Progress Note  Subjective  Chief Complaint  Chief Complaint  Patient presents with  . Hypertension    running high  . Leg Pain    onset several months, outer side of knee    HPI  HTN:she stopped taking bp medication about 5 weeks ago, because she had quit smoking and felt she no longer needed it. .When she came in one month ago her bp was high and we resumed Norvasc 10 mg and also HCTZ with Hydralazine 10 mg to take prn . She states bp has remained elevated, yesterday it went above 200/100. She has also noticed some left upper chest pain, described a soreness, it can happened at rest or activity, constant, but pain varies from 2-7/10. She states sometimes stabbing. She has LVH and was reminded of importance of keeping bp at goal. She was seen by Dr. Gwen Pounds last year and had Echo a normal stress echo 08/2017 and moderate LVH.    Patient Active Problem List   Diagnosis Date Noted  . Ganglion cyst of dorsum of left wrist 02/05/2018  . LVH (left ventricular hypertrophy) due to hypertensive disease, without heart failure 09/23/2017  . SOBOE (shortness of breath on exertion) 09/23/2017  . Hyperlipidemia, mixed 08/28/2017  . Hyperglycemia 08/25/2017  . Hypercalcemia due to thiazide and vitamin A 08/25/2017  . Hypertension 08/18/2017  . GAD (generalized anxiety disorder) 08/18/2017  . GERD (gastroesophageal reflux disease) 08/18/2017  . Panic attack 08/18/2017  . Tobacco use 08/18/2017  . Snoring 08/18/2017  . Systolic ejection murmur 08/18/2017    Past Surgical History:  Procedure Laterality Date  . ABDOMINAL HYSTERECTOMY     Complete Hysterectomy  . CHOLECYSTECTOMY    . GANGLION CYST EXCISION Left 03/01/2018   Procedure: REMOVAL GANGLION OF WRIST;  Surgeon: Deeann Saint, MD;  Location: [facility] ORS;  Service: Orthopedics;  Laterality: Left;    Family History  Problem Relation Age of Onset  .  Hypertension Mother   . Stroke Father   . Hypertension Father   . Hypertension Sister   . Breast cancer Neg Hx     Social History   Socioeconomic History  . Marital status: Married    Spouse name: [name]   . Number of children: 3  . Years of education: Not on file  . Highest education level: 12th grade  Occupational History  . Occupation: Acupuncturist   Social Needs  . Financial resource strain: Not hard at all  . Food insecurity:    Worry: Never true    Inability: Never true  . Transportation needs:    Medical: No    Non-medical: No  Tobacco Use  . Smoking status: Former Smoker    Packs/day: 0.25    Years: 18.00    Pack years: 4.50    Types: Cigarettes    Start date: 08/19/1999  . Smokeless tobacco: Never Used  Substance and Sexual Activity  . Alcohol use: Yes    Alcohol/week: 1.0 standard drinks    Types: 1 Glasses of wine per week  . Drug use: No  . Sexual activity: Yes    Partners: Male    Birth control/protection: Other-see comments    Comment: Hysterectomy  Lifestyle  . Physical activity:    Days per week: 5 days    Minutes per session: 20 min  . Stress: Rather much  Relationships  . Social  connections:    Talks on phone: More than three times a week    Gets together: Twice a week    Attends religious service: More than 4 times per year    Active member of club or organization: Yes    Attends meetings of clubs or organizations: More than 4 times per year    Relationship status: Married  . Intimate partner violence:    Fear of current or ex partner: No    Emotionally abused: No    Physically abused: No    Forced sexual activity: No  Other Topics Concern  . 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      Current Outpatient Medications:  .  amLODipine (NORVASC) 10 MG tablet, Take 1 tablet (10 mg total) by mouth daily., Disp: 90 tablet, Rfl: 1 .  aspirin EC 81 MG tablet, Take 162 mg by mouth daily as needed for moderate pain. , Disp: , Rfl:  .  atorvastatin (LIPITOR) 20 MG tablet, Take 1 tablet (20 mg total) by mouth daily., Disp: 90 tablet, Rfl: 1 .  CHANTIX CONTINUING MONTH PAK 1 MG tablet, Take 1 tablet (1 mg total) by mouth 2 (two) times daily., Disp: 30 tablet, Rfl: 2 .  famotidine (PEPCID) 40 MG tablet, Take 1 tablet (40 mg total) by mouth daily as needed for heartburn or indigestion., Disp: 90 tablet, Rfl: 1 .  hydrALAZINE (APRESOLINE) 10 MG tablet, Take 1 tablet (10 mg total) by mouth 3 (three) times daily., Disp: 90 tablet, Rfl: 0 .  hydrochlorothiazide (HYDRODIURIL) 25 MG tablet, Take 0.5 tablets (12.5 mg total) by mouth 2 (two) times daily., Disp: 180 tablet, Rfl: 0  Allergies  Allergen Reactions  . Penicillins Itching    Has patient had a PCN reaction causing immediate rash, facial/tongue/throat swelling, SOB or lightheadedness with hypotension: No Has patient had a PCN reaction causing severe rash involving mucus membranes or skin necrosis: No Has patient had a PCN reaction that required hospitalization: No Has patient had a PCN reaction occurring within the last 10 years: No If all of the above answers are "NO", then may proceed with Cephalosporin use.     I personally reviewed active problem list, medication list, allergies, family history, social history with the patient/caregiver today.   ROS  Constitutional: Negative for fever or weight change.  Respiratory: Negative for cough and shortness of breath.   Cardiovascular: Negative for chest pain or palpitations.  Gastrointestinal: Negative for abdominal pain, no bowel changes.  Musculoskeletal: Negative for gait problem or joint  swelling.  Skin: Negative for rash.  Neurological: Negative for dizziness or headache.  No other specific complaints in a complete review of systems (except as listed in HPI above).  Objective  Vitals:   12/22/18 0910  BP: (!) 198/96  Pulse: 97  Resp: 12  Temp: 98.2 F (36.8 C)  TempSrc: Oral  SpO2: 99%  Weight: 184 lb 8 oz (83.7 kg)  Height: 5\' 2"  (1.575 m)      Body mass index is 33.75 kg/m.  Physical Exam  Constitutional:  Patient appears well-developed and well-nourished. Obese  No distress.  HEENT: head atraumatic, normocephalic, pupils equal and reactive to light,  neck supple, oral exam not done  Cardiovascular: Normal rate, regular rhythm and normal heart sounds.  No murmur heard. No BLE edema. Pulmonary/Chest: Effort normal and breath sounds normal. No respiratory distress. Abdominal: Soft.  There is no tenderness. Psychiatric: Patient has a normal mood and affect. behavior is normal. Judgment and thought content normal.  PHQ2/9: Depression screen [facility] 2/9 12/22/2018 11/26/2018 08/27/2018 04/20/2018 02/10/2018  Decreased Interest 0 0 0 0 0  Down, Depressed, Hopeless 0 0 0 0 0  PHQ - 2 Score 0 0 0 0 0  Altered sleeping 0 0 - - -  Tired, decreased energy 0 0 - - -  Change in appetite 0 0 - - -  Feeling bad or failure about yourself  0 0 - - -  Trouble concentrating 0 0 - - -  Moving slowly or fidgety/restless 0 0 - - -  Suicidal thoughts 0 0 - - -  PHQ-9 Score 0 0 - - -  Difficult doing work/chores Not difficult at all - - - -    phq 9 is negative   Fall Risk: Fall Risk  12/22/2018 11/26/2018 08/27/2018 04/20/2018 02/10/2018  Falls in the past year? 0 0 0 No No  Number falls in past yr: 0 0 0 - -  Injury with Fall? 0 0 0 - -     Assessment & Plan  1. Uncontrolled hypertension  - hydrochlorothiazide (HYDRODIURIL) 25 MG tablet; Take 0.5 tablets (12.5 mg total) by mouth 2 (two) times daily.  Dispense: 180 tablet; Refill: 0 - hydrALAZINE (APRESOLINE) 10 MG tablet;  Take 1 tablet (10 mg total) by mouth 3 (three) times daily.  Dispense: 90 tablet; Refill: 0 - Ambulatory referral to Nephrology - COMPLETE METABOLIC PANEL WITH GFR - CBC with Differential/Platelet - Thyroid Panel With TSH - Microalbumin / creatinine urine ratio - Parathyroid hormone, intact (no Ca)  2. Pure hypercholesterolemia  - Lipid panel  3. Obesity (BMI 30.0-34.9)  Discussed with the patient the risk posed by an increased BMI. Discussed importance of portion control, calorie counting and at least 150 minutes of physical activity weekly. Avoid sweet beverages and drink more water. Eat at least 6 servings of fruit and vegetables daily   4. LVH (left ventricular hypertrophy) due to hypertensive disease, without heart failure   5. Hyperglycemia  - Hemoglobin A1c  6. Atypical chest pain  - Troponin I - CK total and CKMB (cardiac)not at [facility] - EKG : normal sinus rhythm

## 2018-12-23 LAB — CBC WITH DIFFERENTIAL/PLATELET
Absolute Monocytes: 310 cells/uL (ref 200–950)
Basophils Absolute: 19 cells/uL (ref 0–200)
Basophils Relative: 0.3 %
Eosinophils Absolute: 68 cells/uL (ref 15–500)
Eosinophils Relative: 1.1 %
HCT: 43.1 % (ref 35.0–45.0)
Hemoglobin: 14.1 g/dL (ref 11.7–15.5)
Lymphs Abs: 2511 cells/uL (ref 850–3900)
MCH: 26.5 pg — ABNORMAL LOW (ref 27.0–33.0)
MCHC: 32.7 g/dL (ref 32.0–36.0)
MCV: 81 fL (ref 80.0–100.0)
MPV: 10.7 fL (ref 7.5–12.5)
Monocytes Relative: 5 %
Neutro Abs: 3292 cells/uL (ref 1500–7800)
Neutrophils Relative %: 53.1 %
Platelets: 272 10*3/uL (ref 140–400)
RBC: 5.32 10*6/uL — ABNORMAL HIGH (ref 3.80–5.10)
RDW: 13.7 % (ref 11.0–15.0)
Total Lymphocyte: 40.5 %
WBC: 6.2 10*3/uL (ref 3.8–10.8)

## 2018-12-23 LAB — COMPLETE METABOLIC PANEL WITH GFR
AG Ratio: 1.7 (calc) (ref 1.0–2.5)
ALT: 49 U/L — ABNORMAL HIGH (ref 6–29)
AST: 27 U/L (ref 10–35)
Albumin: 4.8 g/dL (ref 3.6–5.1)
Alkaline phosphatase (APISO): 72 U/L (ref 37–153)
BUN: 21 mg/dL (ref 7–25)
CO2: 25 mmol/L (ref 20–32)
Calcium: 10.2 mg/dL (ref 8.6–10.4)
Chloride: 103 mmol/L (ref 98–110)
Creat: 0.73 mg/dL (ref 0.50–1.05)
GFR, Est African American: 111 mL/min/{1.73_m2} (ref >=60)
GFR, Est Non African American: 95 mL/min/{1.73_m2} (ref >=60)
Globulin: 2.9 g/dL (calc) (ref 1.9–3.7)
Glucose, Bld: 113 mg/dL — ABNORMAL HIGH (ref 65–99)
Potassium: 3.9 mmol/L (ref 3.5–5.3)
Sodium: 139 mmol/L (ref 135–146)
Total Bilirubin: 0.6 mg/dL (ref 0.2–1.2)
Total Protein: 7.7 g/dL (ref 6.1–8.1)

## 2018-12-23 LAB — MICROALBUMIN / CREATININE URINE RATIO
Creatinine, Urine: 158 mg/dL (ref 20–275)
Microalb Creat Ratio: 35 mcg/mg creat — ABNORMAL HIGH (ref <30)
Microalb, Ur: 5.6 mg/dL

## 2018-12-23 LAB — LIPID PANEL
Cholesterol: 198 mg/dL (ref <200)
HDL: 57 mg/dL (ref >=50)
LDL Cholesterol (Calc): 116 mg/dL (calc) — ABNORMAL HIGH
Non-HDL Cholesterol (Calc): 141 mg/dL (calc) — ABNORMAL HIGH (ref <130)
Total CHOL/HDL Ratio: 3.5 (calc) (ref <5.0)
Triglycerides: 137 mg/dL (ref <150)

## 2018-12-23 LAB — HEMOGLOBIN A1C
Hgb A1c MFr Bld: 6.3 % of total Hgb — ABNORMAL HIGH (ref <5.7)
Mean Plasma Glucose: 134 (calc)
eAG (mmol/L): 7.4 (calc)

## 2018-12-23 LAB — THYROID PANEL WITH TSH
Free Thyroxine Index: 2.6 (ref 1.4–3.8)
T3 Uptake: 29 % (ref 22–35)
T4, Total: 8.8 ug/dL (ref 5.1–11.9)
TSH: 2.37 mIU/L

## 2018-12-23 LAB — CK TOTAL AND CKMB (NOT AT ARMC)
CK, MB: 0.9 ng/mL (ref 0–5.0)
Relative Index: 0.5 (ref 0–4.0)
Total CK: 166 U/L — ABNORMAL HIGH (ref 29–143)

## 2018-12-23 LAB — PARATHYROID HORMONE, INTACT (NO CA): PTH: 29 pg/mL (ref 14–64)

## 2018-12-23 LAB — TROPONIN I: Troponin I: <0.01 ng/mL (ref <=0.0)

## 2019-01-28 ENCOUNTER — Encounter: Payer: Self-pay | Admitting: Emergency Medicine

## 2019-01-28 ENCOUNTER — Emergency Department: Payer: Managed Care, Other (non HMO)

## 2019-01-28 ENCOUNTER — Emergency Department
Admission: EM | Admit: 2019-01-28 | Discharge: 2019-01-28 | Disposition: A | Discharge status: Home / Self Care | Payer: Managed Care, Other (non HMO) | Attending: Emergency Medicine | Admitting: Emergency Medicine

## 2019-01-28 ENCOUNTER — Other Ambulatory Visit: Payer: Self-pay

## 2019-01-28 DIAGNOSIS — Z79899 Other long term (current) drug therapy: Secondary | ICD-10-CM | POA: Insufficient documentation

## 2019-01-28 DIAGNOSIS — I10 Essential (primary) hypertension: Secondary | ICD-10-CM | POA: Diagnosis not present

## 2019-01-28 DIAGNOSIS — Z9114 Patient's other noncompliance with medication regimen: Secondary | ICD-10-CM | POA: Diagnosis not present

## 2019-01-28 DIAGNOSIS — G8929 Other chronic pain: Secondary | ICD-10-CM | POA: Diagnosis not present

## 2019-01-28 DIAGNOSIS — M25511 Pain in right shoulder: Secondary | ICD-10-CM | POA: Diagnosis present

## 2019-01-28 DIAGNOSIS — Z9119 Patient's noncompliance with other medical treatment and regimen: Secondary | ICD-10-CM

## 2019-01-28 DIAGNOSIS — X503XXA Overexertion from repetitive movements, initial encounter: Secondary | ICD-10-CM | POA: Diagnosis not present

## 2019-01-28 DIAGNOSIS — Z87891 Personal history of nicotine dependence: Secondary | ICD-10-CM | POA: Diagnosis not present

## 2019-01-28 DIAGNOSIS — M25519 Pain in unspecified shoulder: Secondary | ICD-10-CM

## 2019-01-28 DIAGNOSIS — Z91199 Patient's noncompliance with other medical treatment and regimen due to unspecified reason: Secondary | ICD-10-CM

## 2019-01-28 LAB — GLUCOSE, CAPILLARY: Glucose-Capillary: 108 mg/dL — ABNORMAL HIGH (ref 70–99)

## 2019-01-28 MED ORDER — AMLODIPINE BESYLATE 5 MG PO TABS
10.0000 mg | ORAL_TABLET | Freq: Once | ORAL | Status: AC
  Administered 2019-01-28: 10 mg via ORAL
  Filled 2019-01-28: qty 2

## 2019-01-28 MED ORDER — CYCLOBENZAPRINE HCL 5 MG PO TABS: 5.0000 mg | ORAL_TABLET | Freq: Three times a day (TID) | ORAL | 0 refills | Status: AC | PRN

## 2019-01-28 MED ORDER — HYDROCHLOROTHIAZIDE 25 MG PO TABS
25.0000 mg | ORAL_TABLET | Freq: Every day | ORAL | Status: DC
  Administered 2019-01-28: 25 mg via ORAL
  Filled 2019-01-28: qty 1

## 2019-01-28 MED ORDER — HYDRALAZINE HCL 10 MG PO TABS
10.0000 mg | ORAL_TABLET | Freq: Once | ORAL | Status: AC
  Administered 2019-01-28: 11:00:00 10 mg via ORAL
  Filled 2019-01-28: qty 1

## 2019-01-28 MED ORDER — FENTANYL CITRATE (PF) 100 MCG/2ML IJ SOLN: 50.0000 ug | Freq: Once | INTRAMUSCULAR | Status: DC

## 2019-01-28 MED ORDER — OXYCODONE-ACETAMINOPHEN 5-325 MG PO TABS
1.0000 | ORAL_TABLET | Freq: Once | ORAL | Status: AC
  Administered 2019-01-28: 1 via ORAL
  Filled 2019-01-28: qty 1
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
## 2019-01-28 NOTE — ED Triage Notes (Addendum)
Pt to ED from [facility] for right arm pain and hypertension. Per [facility] staff, pts blood pressure was 198/114. Pt states that symptoms first started on Wednesday but have gotten worse. Pt has limited movement of the right arm. Swelling noted in right hand.

## 2019-01-28 NOTE — Discharge Instructions (Signed)
You would prefer not to have any blood work drawn which is certainly your choice but does limit our ability to work-up certain problems like we talked about.  This means we cannot say for example definitively that there is nothing going on with your heart even that we have low suspicion.  If you change your mind, or you feel worse in any way please return to the emergency room.  Do not drive home today after the Percocet you received.  Do not drink or drive on the medication we are giving you.  Please follow close with your primary care doctor and the [facility] surgeon listed above.  If you have increased pain, fever, swelling, chest pain, or you feel worse in any way please return to the emergency room.  We also noticed that your blood pressure is elevated, this is likely because you did not take your medications.  We have given you your home dose of medication now.  Please take your medications tonight, and again if you have headaches or chest pain or other concerns please return to the ER.  Otherwise, see your doctor tomorrow or as soon as they open for recheck of blood pressure.  It is very important you take your blood pressure every day as prescribed.

## 2019-02-17 ENCOUNTER — Other Ambulatory Visit: Payer: Self-pay | Admitting: Nephrology

## 2019-02-17 DIAGNOSIS — R809 Proteinuria, unspecified: Secondary | ICD-10-CM

## 2019-02-17 DIAGNOSIS — I10 Essential (primary) hypertension: Secondary | ICD-10-CM

## 2019-02-28 ENCOUNTER — Ambulatory Visit
Admission: RE | Admit: 2019-02-28 | Discharge: 2019-02-28 | Disposition: A | Discharge status: Home / Self Care | Payer: Managed Care, Other (non HMO) | Source: Ambulatory Visit | Attending: Nephrology | Admitting: Nephrology

## 2019-02-28 ENCOUNTER — Other Ambulatory Visit: Payer: Self-pay

## 2019-02-28 DIAGNOSIS — I10 Essential (primary) hypertension: Secondary | ICD-10-CM

## 2019-02-28 DIAGNOSIS — R809 Proteinuria, unspecified: Secondary | ICD-10-CM | POA: Diagnosis present

## 2019-03-02 ENCOUNTER — Other Ambulatory Visit: Payer: Self-pay | Admitting: Nephrology

## 2019-03-02 ENCOUNTER — Other Ambulatory Visit (HOSPITAL_COMMUNITY): Payer: Self-pay | Admitting: Nephrology

## 2019-03-02 DIAGNOSIS — R809 Proteinuria, unspecified: Secondary | ICD-10-CM

## 2019-03-02 DIAGNOSIS — I10 Essential (primary) hypertension: Secondary | ICD-10-CM

## 2019-03-03 ENCOUNTER — Ambulatory Visit (INDEPENDENT_AMBULATORY_CARE_PROVIDER_SITE_OTHER): Payer: Managed Care, Other (non HMO) | Admitting: Family Medicine

## 2019-03-03 ENCOUNTER — Other Ambulatory Visit: Payer: Self-pay

## 2019-03-03 ENCOUNTER — Encounter: Payer: Self-pay | Admitting: Family Medicine

## 2019-03-03 VITALS — BP 183/105 | Wt 187.0 lb

## 2019-03-03 DIAGNOSIS — E7800 Pure hypercholesterolemia, unspecified: Secondary | ICD-10-CM

## 2019-03-03 DIAGNOSIS — R0683 Snoring: Secondary | ICD-10-CM

## 2019-03-03 DIAGNOSIS — R7303 Prediabetes: Secondary | ICD-10-CM

## 2019-03-03 DIAGNOSIS — I10 Essential (primary) hypertension: Secondary | ICD-10-CM | POA: Diagnosis not present

## 2019-03-03 DIAGNOSIS — Z716 Tobacco abuse counseling: Secondary | ICD-10-CM | POA: Diagnosis not present

## 2019-03-03 DIAGNOSIS — I119 Hypertensive heart disease without heart failure: Secondary | ICD-10-CM

## 2019-03-03 DIAGNOSIS — F411 Generalized anxiety disorder: Secondary | ICD-10-CM

## 2019-03-03 DIAGNOSIS — G4700 Insomnia, unspecified: Secondary | ICD-10-CM

## 2019-03-03 MED ORDER — VARENICLINE TARTRATE 0.5 MG PO TABS: 0.5000 mg | ORAL_TABLET | Freq: Two times a day (BID) | ORAL | 2 refills | Status: DC

## 2019-03-03 MED ORDER — CHANTIX CONTINUING MONTH PAK 1 MG PO TABS: 1.0000 mg | ORAL_TABLET | Freq: Two times a day (BID) | ORAL | 2 refills | Status: DC

## 2019-03-03 MED ORDER — TEMAZEPAM 15 MG PO CAPS: 15.0000 mg | ORAL_CAPSULE | Freq: Every evening | ORAL | 0 refills | Status: DC | PRN

## 2019-03-03 MED ORDER — HYDRALAZINE HCL 10 MG PO TABS: 10.0000 mg | ORAL_TABLET | Freq: Three times a day (TID) | ORAL | 0 refills | Status: DC

## 2019-03-03 MED ORDER — HYDROCHLOROTHIAZIDE 25 MG PO TABS: 25.0000 mg | ORAL_TABLET | Freq: Every day | ORAL | 0 refills | Status: DC

## 2019-03-03 NOTE — Progress Notes (Signed)
Name: [name]   MRN: [id number]    DOB: Dec 17, 1965   Date:03/03/2019       Progress Note  Subjective  Chief Complaint  Chief Complaint  Patient presents with  . Anxiety  . Gastroesophageal Reflux  . Hyperglycemia  . Hyperlipidemia  . Hypertension    I connected with  [name]  on 03/03/19 at  8:40 AM EDT by a video enabled telemedicine application and verified that I am speaking with the correct person using two identifiers.  I discussed the limitations of evaluation and management by telemedicine and the availability of in person appointments. The patient expressed understanding and agreed to proceed. Staff also discussed with the patient that there may be a patient responsible charge related to this service. Patient Location: at home  Provider Location: [facility]   HPI  [id number] taking bp medication earlier this year  because she had quit smoking and felt she no longer needed it. When she was seen in our office in May her bp was high and we resumed Norvasc 10 mg and also HCTZ with Hydralazine 10 mg to take prn. Since her last visit with me she has seen Dr. Abigail Butts and had a renal US, she is now on Losartan 100 mg, Norvasc 10 mg, HCTZ 12.5 mg and Hydralazine BID, her bp at home is still very high in the 190's-100's, denies chest pain, she wakes up with a headache, she is willing to have sleep study, it was ordered last year but she states never heard from them. I will place a new referral. She has seen Dr. Nehemiah Massed in the past and has LVH  GERD: she states only flaring with dietary indiscretion. She has been taking Nexium prn , advised to get refill of Pepcid and take it prn instead   Pre-diabetes: she denies polyphagia, polyuria or polydipsia, last labs slightly better.   Snoring: also has LVH, uncontrolled hypertension, interrupted sleep   Patient Active Problem List   Diagnosis Date Noted  . Ganglion cyst of dorsum of left wrist  02/05/2018  . LVH (left ventricular hypertrophy) due to hypertensive disease, without heart failure 09/23/2017  . SOBOE (shortness of breath on exertion) 09/23/2017  . Hyperlipidemia, mixed 08/28/2017  . Hyperglycemia 08/25/2017  . Hypercalcemia due to thiazide and vitamin A 08/25/2017  . Hypertension 08/18/2017  . GAD (generalized anxiety disorder) 08/18/2017  . GERD (gastroesophageal reflux disease) 08/18/2017  . Panic attack 08/18/2017  . Tobacco use 08/18/2017  . Snoring 08/18/2017  . Systolic ejection murmur 63/07/6008    Past Surgical History:  Procedure Laterality Date  . ABDOMINAL HYSTERECTOMY     Complete Hysterectomy  . CHOLECYSTECTOMY    . GANGLION CYST EXCISION Left 03/01/2018   Procedure: REMOVAL GANGLION OF WRIST;  Surgeon: Earnestine Leys, MD;  Location: [facility] ORS;  Service: Orthopedics;  Laterality: Left;    Family History  Problem Relation Age of Onset  . Hypertension Mother   . Stroke Father   . Hypertension Father   . Hypertension Sister   . Breast cancer Neg Hx     Social History   Socioeconomic History  . Marital status: Married    Spouse name: [name]   . Number of children: 3  . Years of education: Not on file  . Highest education level: 12th grade  Occupational History  . Occupation: Furniture conservator/restorer   Social Needs  . Financial resource strain: Not hard at all  . Food insecurity    Worry:  Never true    Inability: Never true  . Transportation needs    Medical: No    Non-medical: No  Tobacco Use  . Smoking status: Current Every Day Smoker    Packs/day: 0.50    Years: 18.00    Pack years: 9.00    Types: Cigarettes    Start date: 08/19/1999  . Smokeless tobacco: Never Used  Substance and Sexual Activity  . Alcohol use: Yes    Alcohol/week: 1.0 standard drinks    Types: 1 Glasses of wine per week  . Drug use: No  . Sexual activity: Yes    Partners: Male    Birth control/protection: Other-see comments    Comment: Hysterectomy  Lifestyle   . Physical activity    Days per week: 5 days    Minutes per session: 20 min  . Stress: Rather much  Relationships  . Social connections    Talks on phone: More than three times a week    Gets together: Twice a week    Attends religious service: More than 4 times per year    Active member of club or organization: Yes    Attends meetings of clubs or organizations: More than 4 times per year    Relationship status: Married  . Intimate partner violence    Fear of current or ex partner: No    Emotionally abused: No    Physically abused: No    Forced sexual activity: No  Other Topics Concern  . 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      Current Outpatient Medications:  .  amLODipine (NORVASC) 10 MG tablet, Take 1 tablet (10 mg total) by mouth daily., Disp: 90 tablet, Rfl: 1 .  aspirin EC 81 MG tablet, Take 162 mg by mouth daily as needed for moderate pain. , Disp: , Rfl:  .  atorvastatin (LIPITOR) 20 MG tablet, Take 1 tablet (20 mg total) by mouth daily., Disp: 90 tablet, Rfl: 1 .  CHANTIX CONTINUING MONTH PAK 1 MG tablet, Take 1 tablet (1 mg total) by mouth 2 (two) times daily., Disp: 30 tablet, Rfl: 2 .  famotidine (PEPCID) 40 MG tablet, Take 1 tablet (40 mg total) by mouth daily as needed for heartburn or indigestion., Disp: 90 tablet, Rfl: 1 .  hydrALAZINE (APRESOLINE) 10 MG tablet, Take 1 tablet (10 mg total) by mouth 3 (three) times daily., Disp: 90 tablet, Rfl: 0 .  hydrochlorothiazide (HYDRODIURIL) 25 MG tablet, Take 0.5 tablets (12.5 mg total) by mouth 2 (two) times daily., Disp: 180 tablet, Rfl: 0 .  losartan (COZAAR) 100 MG tablet, Take 100 mg  by mouth daily., Disp: , Rfl:   Allergies  Allergen Reactions  . Penicillins Itching    Has patient had a PCN reaction causing immediate rash, facial/tongue/throat swelling, SOB or lightheadedness with hypotension: No Has patient had a PCN reaction causing severe rash involving mucus membranes or skin necrosis: No Has patient had a PCN reaction that required hospitalization: No Has patient had a PCN reaction occurring within the last 10 years: No If all  of the above answers are "NO", then may proceed with Cephalosporin use.     I personally reviewed active problem list, medication list, allergies, family history, social history with the patient/caregiver today.   ROS  Ten systems reviewed and is negative except as mentioned in HPI   Objective  Virtual encounter  Vitals:   03/03/19 0904  BP: (!) 183/105    Body mass index is 35.33 kg/m.  Physical Exam  Awake, alert and oriented   PHQ2/9: Depression screen [facility] 2/9 03/03/2019 12/22/2018 11/26/2018 08/27/2018 04/20/2018  Decreased Interest 0 0 0 0 0  Down, Depressed, Hopeless 0 0 0 0 0  PHQ - 2 Score 0 0 0 0 0  Altered sleeping 0 0 0 - -  Tired, decreased energy 0 0 0 - -  Change in appetite 0 0 0 - -  Feeling bad or failure about yourself  0 0 0 - -  Trouble concentrating 0 0 0 - -  Moving slowly or fidgety/restless 0 0 0 - -  Suicidal thoughts 0 0 0 - -  PHQ-9 Score 0 0 0 - -  Difficult doing work/chores - Not difficult at all - - -   PHQ-2/9 Result is negative.    Fall Risk: Fall Risk  03/03/2019 12/22/2018 11/26/2018 08/27/2018 04/20/2018  Falls in the past year? 0 0 0 0 No  Number falls in past yr: 0 0 0 0 -  Injury with Fall? 0 0 0 0 -     Assessment & Plan  1. Encounter for tobacco use cessation counseling  - varenicline (CHANTIX) 0.5 MG tablet; Take 1 tablet (0.5 mg total) by mouth 2 (two) times daily.  Dispense: 60 tablet; Refill: 2 - CHANTIX CONTINUING MONTH PAK 1 MG tablet; Take 1 tablet (1 mg total) by  mouth 2 (two) times daily.  Dispense: 30 tablet; Refill: 2  2. Uncontrolled hypertension  - hydrochlorothiazide (HYDRODIURIL) 25 MG tablet; Take 1 tablet (25 mg total) by mouth daily.  Dispense: 90 tablet; Refill: 0 - hydrALAZINE (APRESOLINE) 10 MG tablet; Take 1 tablet (10 mg total) by mouth 3 (three) times daily.  Dispense: 270 tablet; Refill: 0 - Ambulatory referral to Sleep Studies  3. GAD (generalized anxiety disorder)   4. Pure hypercholesterolemia   5. Pre-diabetes   6. Insomnia, unspecified type  We will try temazepam prn   7. Snoring  - Ambulatory referral to Sleep Studies  8. LVH (left ventricular hypertrophy) due to hypertensive disease, without heart failure  - Ambulatory referral to Sleep Studies  I discussed the assessment and treatment plan with the patient. The patient was provided an opportunity to ask questions and all were answered. The patient agreed with the plan and demonstrated an understanding of the instructions.  The patient was advised to call back or seek an in-person evaluation if the symptoms worsen or if the condition fails to improve as anticipated.  I provided 25  minutes of non-face-to-face time during this encounter.

## 2019-03-18 ENCOUNTER — Ambulatory Visit: Payer: Managed Care, Other (non HMO)

## 2019-05-17 ENCOUNTER — Other Ambulatory Visit: Payer: Self-pay | Admitting: *Deleted

## 2019-05-17 DIAGNOSIS — Z20822 Contact with and (suspected) exposure to covid-19: Secondary | ICD-10-CM

## 2019-05-19 LAB — NOVEL CORONAVIRUS, NAA: SARS-CoV-2, NAA: NOT DETECTED

## 2019-06-14 ENCOUNTER — Ambulatory Visit (INDEPENDENT_AMBULATORY_CARE_PROVIDER_SITE_OTHER): Payer: Managed Care, Other (non HMO) | Admitting: Family Medicine

## 2019-06-14 ENCOUNTER — Other Ambulatory Visit: Payer: Self-pay

## 2019-06-14 ENCOUNTER — Encounter: Payer: Self-pay | Admitting: Family Medicine

## 2019-06-14 DIAGNOSIS — F411 Generalized anxiety disorder: Secondary | ICD-10-CM | POA: Diagnosis not present

## 2019-06-14 DIAGNOSIS — I10 Essential (primary) hypertension: Secondary | ICD-10-CM

## 2019-06-14 DIAGNOSIS — Z716 Tobacco abuse counseling: Secondary | ICD-10-CM

## 2019-06-14 DIAGNOSIS — R0683 Snoring: Secondary | ICD-10-CM

## 2019-06-14 DIAGNOSIS — K219 Gastro-esophageal reflux disease without esophagitis: Secondary | ICD-10-CM

## 2019-06-14 MED ORDER — OLMESARTAN-AMLODIPINE-HCTZ 40-10-25 MG PO TABS: 1.0000 | ORAL_TABLET | Freq: Every day | ORAL | 0 refills | Status: DC

## 2019-06-14 MED ORDER — CHANTIX CONTINUING MONTH PAK 1 MG PO TABS: 1.0000 mg | ORAL_TABLET | Freq: Two times a day (BID) | ORAL | 2 refills | Status: DC

## 2019-06-14 MED ORDER — ESCITALOPRAM OXALATE 10 MG PO TABS: 10.0000 mg | ORAL_TABLET | Freq: Every day | ORAL | 0 refills | Status: DC

## 2019-06-14 MED ORDER — FAMOTIDINE 40 MG PO TABS: 40.0000 mg | ORAL_TABLET | Freq: Every day | ORAL | 1 refills | Status: DC | PRN

## 2019-06-14 MED ORDER — NEBIVOLOL HCL 10 MG PO TABS: 10.0000 mg | ORAL_TABLET | Freq: Every day | ORAL | 0 refills | Status: DC

## 2019-06-14 NOTE — Progress Notes (Signed)
Name: [name]   MRN: [id number]    DOB: 10/07/1965   Date:06/14/2019       Progress Note  Subjective  Chief Complaint  Chief Complaint  Patient presents with  . Anxiety    anxiety has rapidly worsening. She just bought a new house and she is worried about the things that are going on in her city.  . Shoulder Pain    She has been evaluated for shoulder pain. She continues to have pain intermittently. She describes pain as tingling. She has been massaging, using heating pad and iburprofen. She wants a shoulder sling but she can't find one.     I connected with  [name]  on 06/14/19 at  3:40 PM EST by a video enabled telemedicine application and verified that I am speaking with the correct person using two identifiers.  I discussed the limitations of evaluation and management by telemedicine and the availability of in person appointments. The patient expressed understanding and agreed to proceed. Staff also discussed with the patient that there may be a patient responsible charge related to this service. Patient Location: at daughter's house [facility]   HPI  WUJ:[id number]:shestopped taking bp medication earlier this year because shehadquit smokingand felt she no longer needed it. When she was seen in our office in May her bp was high and we resumed Norvasc 10 mg and also HCTZ with Hydralazine 10 mg to take prn. Since her last visit with me she has seen Dr. Ronn MelenaKolloru and had a renal US, she is now on Losartan 100 mg, Norvasc 10 mg, HCTZ 12.5 mg and Hydralazine BID, her bp at home is still very high in the 190's, denies chest pain, she wakes up with a headache, we will try placing another order for sleep study. She has seen Dr. Gwen PoundsKowalski in the past and has LVH. Discussed simplifying her regiment and she agrees, explained the changes and she will also look into mychart, she will follow up in 3 weeks for bp check and if not done sooner she will get flu  shot also   Anxiety: she states that over the past 3 weeks she has been very nervous, worried that something bad will happen to her kids. She states that over the past Summer she states some of her son's acquaintances got murdered . She is states worrying all the time. She cannot sleep because she has been worrying at night. She is moving to a new neighborhood but not sure if it will help since the neighborhood she lives now is not a bad neighborhood. She states she had quit smoking and is back smoking again because she is nervous  GERD: she states only flaring with dietary indiscretion. She needs refill of Pepcid, she takes prn and it works well for her  Pre-diabetes: she denies polyphagia, polyuria or polydipsia, last labs slightly better.   Snoring: also has LVH, uncontrolled hypertension, interrupted sleep - She is not sure if covered by her insurance. She did not hear back, I will place another order    Patient Active Problem List   Diagnosis Date Noted  . Ganglion cyst of dorsum of left wrist 02/05/2018  . LVH (left ventricular hypertrophy) due to hypertensive disease, without heart failure 09/23/2017  . SOBOE (shortness of breath on exertion) 09/23/2017  . Hyperlipidemia, mixed 08/28/2017  . Hyperglycemia 08/25/2017  . Hypercalcemia due to thiazide and vitamin A 08/25/2017  . Hypertension 08/18/2017  . GAD (generalized anxiety  disorder) 08/18/2017  . GERD (gastroesophageal reflux disease) 08/18/2017  . Panic attack 08/18/2017  . Tobacco use 08/18/2017  . Snoring 08/18/2017  . Systolic ejection murmur 62/95/2841    Past Surgical History:  Procedure Laterality Date  . ABDOMINAL HYSTERECTOMY     Complete Hysterectomy  . CHOLECYSTECTOMY    . GANGLION CYST EXCISION Left 03/01/2018   Procedure: REMOVAL GANGLION OF WRIST;  Surgeon: Earnestine Leys, MD;  Location: [facility] ORS;  Service: Orthopedics;  Laterality: Left;    Family History  Problem Relation Age of Onset  .  Hypertension Mother   . Stroke Father   . Hypertension Father   . Hypertension Sister   . Breast cancer Neg Hx     Social History   Socioeconomic History  . Marital status: Married    Spouse name: [name]   . Number of children: 3  . Years of education: Not on file  . Highest education level: 12th grade  Occupational History  . Occupation: Furniture conservator/restorer   Social Needs  . Financial resource strain: Not hard at all  . Food insecurity    Worry: Never true    Inability: Never true  . Transportation needs    Medical: No    Non-medical: No  Tobacco Use  . Smoking status: Current Every Day Smoker    Packs/day: 0.50    Years: 18.00    Pack years: 9.00    Types: Cigarettes    Start date: 08/19/1999  . Smokeless tobacco: Never Used  Substance and Sexual Activity  . Alcohol use: Yes    Alcohol/week: 1.0 standard drinks    Types: 1 Glasses of wine per week  . Drug use: No  . Sexual activity: Yes    Partners: Male    Birth control/protection: Other-see comments    Comment: Hysterectomy  Lifestyle  . Physical activity    Days per week: 5 days    Minutes per session: 20 min  . Stress: Rather much  Relationships  . Social connections    Talks on phone: More than three times a week    Gets together: Twice a week    Attends religious service: More than 4 times per year    Active member of club or organization: Yes    Attends meetings of clubs or organizations: More than 4 times per year    Relationship status: Married  . Intimate partner violence    Fear of current or ex partner: No    Emotionally abused: No    Physically abused: No    Forced sexual activity: No  Other Topics Concern  . Not on file  Social History Narrative   Re-married since 2012   Only the two of them at home   She has three children from previous relationship and he has one   She works a full at [employer] and part time at [employer]    Her only son ( youngest child) went to prison  for 3 years ( from 2014 - 2017), since than she has been anxious and scared of darkness.      Current Outpatient Medications:  .  amLODipine (NORVASC) 10 MG tablet, Take 1 tablet (10 mg total) by mouth daily., Disp: 90 tablet, Rfl: 1 .  aspirin EC 81 MG tablet, Take 162 mg by mouth daily as needed for moderate pain. , Disp: , Rfl:  .  atorvastatin (LIPITOR) 20 MG tablet, Take 1 tablet (20 mg total) by mouth daily., Disp:  90 tablet, Rfl: 1 .  CHANTIX CONTINUING MONTH PAK 1 MG tablet, Take 1 tablet (1 mg total) by mouth 2 (two) times daily., Disp: 30 tablet, Rfl: 2 .  famotidine (PEPCID) 40 MG tablet, Take 1 tablet (40 mg total) by mouth daily as needed for heartburn or indigestion., Disp: 90 tablet, Rfl: 1 .  hydrALAZINE (APRESOLINE) 10 MG tablet, Take 1 tablet (10 mg total) by mouth 3 (three) times daily., Disp: 270 tablet, Rfl: 0 .  hydrochlorothiazide (HYDRODIURIL) 25 MG tablet, Take 1 tablet (25 mg total) by mouth daily., Disp: 90 tablet, Rfl: 0 .  losartan (COZAAR) 100 MG tablet, Take 100 mg by mouth daily., Disp: , Rfl:  .  temazepam (RESTORIL) 15 MG capsule, Take 1 capsule (15 mg total) by mouth at bedtime as needed for sleep., Disp: 30 capsule, Rfl: 0 .  varenicline (CHANTIX) 0.5 MG tablet, Take 1 tablet (0.5 mg total) by mouth 2 (two) times daily., Disp: 60 tablet, Rfl: 2  Allergies  Allergen Reactions  . Penicillins Itching    Has patient had a PCN reaction causing immediate rash, facial/tongue/throat swelling, SOB or lightheadedness with hypotension: No Has patient had a PCN reaction causing severe rash involving mucus membranes or skin necrosis: No Has patient had a PCN reaction that required hospitalization: No Has patient had a PCN reaction occurring within the last 10 years: No If all of the above answers are "NO", then may proceed with Cephalosporin use.     I personally reviewed active problem list, medication list, allergies, family history, social history, health  maintenance with the patient/caregiver today.   ROS  Ten systems reviewed and is negative except as mentioned in HPI   Objective  Virtual encounter, vitals not obtained.  There is no height or weight on file to calculate BMI.  Physical Exam  Awake, alert and oriented   PHQ2/9: Depression screen [facility] 2/9 06/14/2019 03/03/2019 12/22/2018 11/26/2018 08/27/2018  Decreased Interest 0 0 0 0 0  Down, Depressed, Hopeless 0 0 0 0 0  PHQ - 2 Score 0 0 0 0 0  Altered sleeping 0 0 0 0 -  Tired, decreased energy 0 0 0 0 -  Change in appetite 0 0 0 0 -  Feeling bad or failure about yourself  0 0 0 0 -  Trouble concentrating 0 0 0 0 -  Moving slowly or fidgety/restless 0 0 0 0 -  Suicidal thoughts 0 0 0 0 -  PHQ-9 Score 0 0 0 0 -  Difficult doing work/chores - - Not difficult at all - -   PHQ-2/9 Result is negative.    GAD 7 : Generalized Anxiety Score 06/14/2019 03/03/2019 08/18/2017  Nervous, Anxious, on Edge 1 0 3  Control/stop worrying 1 0 2  Worry too much - different things 3 0 3  Trouble relaxing 0 0 2  Restless 3 0 2  Easily annoyed or irritable 0 0 1  Afraid - awful might happen 3 0 3  Total GAD 7 Score 11 0 16  Anxiety Difficulty Not difficult at all - Very difficult     Fall Risk: Fall Risk  06/14/2019 03/03/2019 12/22/2018 11/26/2018 08/27/2018  Falls in the past year? 0 0 0 0 0  Number falls in past yr: 0 0 0 0 0  Injury with Fall? 0 0 0 0 0     Assessment & Plan  1. Gastroesophageal reflux disease without esophagitis  - famotidine (PEPCID) 40 MG tablet; Take 1 tablet (  40 mg total) by mouth daily as needed for heartburn or indigestion.  Dispense: 90 tablet; Refill: 1  2. Uncontrolled hypertension  She has difficulty taking multiple medications , we will simplify regiment - Olmesartan-amLODIPine-HCTZ 40-10-25 MG TABS; Take 1 tablet by mouth daily.  Dispense: 30 tablet; Refill: 0 - nebivolol (BYSTOLIC) 10 MG tablet; Take 1 tablet (10 mg total) by mouth daily.  Dispense:  30 tablet; Refill: 0 - Ambulatory referral to Sleep Studies  3. Encounter for tobacco use cessation counseling  - CHANTIX CONTINUING MONTH PAK 1 MG tablet; Take 1 tablet (1 mg total) by mouth 2 (two) times daily.  Dispense: 30 tablet; Refill: 2  4. GAD (generalized anxiety disorder)  - escitalopram (LEXAPRO) 10 MG tablet; Take 1 tablet (10 mg total) by mouth daily.  Dispense: 30 tablet; Refill: 0  5. Snoring  - Ambulatory referral to Sleep Studies  I discussed the assessment and treatment plan with the patient. The patient was provided an opportunity to ask questions and all were answered. The patient agreed with the plan and demonstrated an understanding of the instructions.  The patient was advised to call back or seek an in-person evaluation if the symptoms worsen or if the condition fails to improve as anticipated.  I provided 25 minutes of non-face-to-face time during this encounter.

## 2019-06-14 NOTE — Patient Instructions (Signed)
Stop HCTZ, losartan and amlodipine Start Tribenzor ( three medications in one ) Start bystolic  May take hydralazine if bp still above 150/90

## 2019-07-01 ENCOUNTER — Telehealth: Payer: Self-pay

## 2019-07-01 DIAGNOSIS — R0683 Snoring: Secondary | ICD-10-CM

## 2019-07-01 DIAGNOSIS — E669 Obesity, unspecified: Secondary | ICD-10-CM

## 2019-07-01 DIAGNOSIS — I10 Essential (primary) hypertension: Secondary | ICD-10-CM

## 2019-07-01 DIAGNOSIS — I119 Hypertensive heart disease without heart failure: Secondary | ICD-10-CM

## 2019-07-01 NOTE — Telephone Encounter (Signed)
Patient sleep study keeps getting denied. Please send to Pulmonology so they can get her insurance to cover Sleep Study due to snoring. Sleep Study PA Request Form is required for medical necessity.

## 2019-08-01 ENCOUNTER — Ambulatory Visit (INDEPENDENT_AMBULATORY_CARE_PROVIDER_SITE_OTHER): Payer: Managed Care, Other (non HMO) | Admitting: Family Medicine

## 2019-08-01 ENCOUNTER — Encounter: Payer: Self-pay | Admitting: Family Medicine

## 2019-08-01 VITALS — Ht 62.0 in | Wt 182.0 lb

## 2019-08-01 DIAGNOSIS — J029 Acute pharyngitis, unspecified: Secondary | ICD-10-CM | POA: Diagnosis not present

## 2019-08-01 DIAGNOSIS — K219 Gastro-esophageal reflux disease without esophagitis: Secondary | ICD-10-CM

## 2019-08-01 MED ORDER — FLUTICASONE PROPIONATE 50 MCG/ACT NA SUSP: 2.0000 | Freq: Every day | NASAL | 0 refills | Status: DC

## 2019-08-01 MED ORDER — OMEPRAZOLE 40 MG PO CPDR: 40.0000 mg | DELAYED_RELEASE_CAPSULE | Freq: Every day | ORAL | 0 refills | Status: DC

## 2019-08-01 NOTE — Progress Notes (Signed)
Name: [name]   MRN: [id number]    DOB: 02/01/1966   Date:08/01/2019       Progress Note  Subjective  Chief Complaint  Chief Complaint  Patient presents with  . Sore Throat    Onset 2 weeks-Negative Covid    I connected with  [name]  on 08/01/19 at  3:20 PM EST by a video enabled telemedicine application and verified that I am speaking with the correct person using two identifiers.  I discussed the limitations of evaluation and management by telemedicine and the availability of in person appointments. The patient expressed understanding and agreed to proceed. Staff also discussed with the patient that there may be a patient responsible charge related to this service. Patient Location: at home  Provider Location: called from work multiple times no answer, visit done from home at night    HPI  Sore Throat: she states sore throat started a couple of weeks ago. She states worse in the morning . She denies fever, chills, lack of appetite, lack of sense of taste of smell. No nasal congestion or post-nasal drainage. She has a history of allergies but usually not this time of the year. She also has GERD and has been out of Pepcid for weeks now and states she has noticed increase in indigestion and regurgitation. She states sister was recently diagnosed with COVID-19 but she has not seen her in about two weeks  Patient Active Problem List   Diagnosis Date Noted  . Ganglion cyst of dorsum of left wrist 02/05/2018  . LVH (left ventricular hypertrophy) due to hypertensive disease, without heart failure 09/23/2017  . SOBOE (shortness of breath on exertion) 09/23/2017  . Hyperlipidemia, mixed 08/28/2017  . Hyperglycemia 08/25/2017  . Hypercalcemia due to thiazide and vitamin A 08/25/2017  . Hypertension 08/18/2017  . GAD (generalized anxiety disorder) 08/18/2017  . GERD (gastroesophageal reflux disease) 08/18/2017  . Panic attack 08/18/2017  . Tobacco use 08/18/2017  . Snoring  08/18/2017  . Systolic ejection murmur 67/61/9509    Past Surgical History:  Procedure Laterality Date  . ABDOMINAL HYSTERECTOMY     Complete Hysterectomy  . CHOLECYSTECTOMY    . GANGLION CYST EXCISION Left 03/01/2018   Procedure: REMOVAL GANGLION OF WRIST;  Surgeon: Earnestine Leys, MD;  Location: [facility] ORS;  Service: Orthopedics;  Laterality: Left;    Family History  Problem Relation Age of Onset  . Hypertension Mother   . Stroke Father   . Hypertension Father   . Hypertension Sister   . Breast cancer Neg Hx       Current Outpatient Medications:  .  aspirin EC 81 MG tablet, Take 162 mg by mouth daily as needed for moderate pain. , Disp: , Rfl:  .  atorvastatin (LIPITOR) 20 MG tablet, Take 1 tablet (20 mg total) by mouth daily., Disp: 90 tablet, Rfl: 1 .  famotidine (PEPCID) 40 MG tablet, Take 1 tablet (40 mg total) by mouth daily as needed for heartburn or indigestion., Disp: 90 tablet, Rfl: 1 .  hydrALAZINE (APRESOLINE) 10 MG tablet, Take 1 tablet (10 mg total) by mouth 3 (three) times daily., Disp: 270 tablet, Rfl: 0 .  nebivolol (BYSTOLIC) 10 MG tablet, Take 1 tablet (10 mg total) by mouth daily., Disp: 30 tablet, Rfl: 0 .  Olmesartan-amLODIPine-HCTZ 40-10-25 MG TABS, Take 1 tablet by mouth daily., Disp: 30 tablet, Rfl: 0  Allergies  Allergen Reactions  . Penicillins Itching    Has patient had a PCN  reaction causing immediate rash, facial/tongue/throat swelling, SOB or lightheadedness with hypotension: No Has patient had a PCN reaction causing severe rash involving mucus membranes or skin necrosis: No Has patient had a PCN reaction that required hospitalization: No Has patient had a PCN reaction occurring within the last 10 years: No If all of the above answers are "NO", then may proceed with Cephalosporin use.     I personally reviewed medication list , PMHx and social histor y with the patient/caregiver today.   ROS   Ten systems reviewed and is negative except as  mentioned in HPI  Objective  Virtual encounter, vitals not obtained.  Body mass index is 33.29 kg/m.  Physical Exam  Awake, alert and oriented   PHQ2/9: Depression screen [facility] 2/9 08/01/2019 06/14/2019 03/03/2019 12/22/2018 11/26/2018  Decreased Interest 0 0 0 0 0  Down, Depressed, Hopeless 0 0 0 0 0  PHQ - 2 Score 0 0 0 0 0  Altered sleeping 0 0 0 0 0  Tired, decreased energy 0 0 0 0 0  Change in appetite 0 0 0 0 0  Feeling bad or failure about yourself  0 0 0 0 0  Trouble concentrating 0 0 0 0 0  Moving slowly or fidgety/restless 0 0 0 0 0  Suicidal thoughts 0 0 0 0 0  PHQ-9 Score 0 0 0 0 0  Difficult doing work/chores - - - Not difficult at all -   PHQ-2/9 Result is negative.    Fall Risk: Fall Risk  08/01/2019 06/14/2019 03/03/2019 12/22/2018 11/26/2018  Falls in the past year? 0 0 0 0 0  Number falls in past yr: 0 0 0 0 0  Injury with Fall? 0 0 0 0 0     Assessment & Plan  1. Gastroesophageal reflux disease without esophagitis  It may be the cause of sore throat, but she will call back if no resolution of symptoms - omeprazole (PRILOSEC) 40 MG capsule; Take 1 capsule (40 mg total) by mouth daily.  Dispense: 30 capsule; Refill: 0  2. Sore throat  - fluticasone (FLONASE) 50 MCG/ACT nasal spray; Place 2 sprays into both nostrils daily.  Dispense: 16 g; Refill: 0  I discussed the assessment and treatment plan with the patient. The patient was provided an opportunity to ask questions and all were answered. The patient agreed with the plan and demonstrated an understanding of the instructions.  The patient was advised to call back or seek an in-person evaluation if the symptoms worsen or if the condition fails to improve as anticipated.  I provided 15 minutes of non-face-to-face time during this encounter.

## 2019-08-11 ENCOUNTER — Encounter: Payer: Self-pay | Admitting: Pulmonary Disease

## 2019-08-11 ENCOUNTER — Ambulatory Visit (INDEPENDENT_AMBULATORY_CARE_PROVIDER_SITE_OTHER): Payer: Managed Care, Other (non HMO) | Admitting: Pulmonary Disease

## 2019-08-11 ENCOUNTER — Other Ambulatory Visit: Payer: Self-pay

## 2019-08-11 VITALS — BP 128/72 | HR 89 | Temp 97.2°F | Ht 61.0 in | Wt 191.4 lb

## 2019-08-11 DIAGNOSIS — Z716 Tobacco abuse counseling: Secondary | ICD-10-CM

## 2019-08-11 DIAGNOSIS — R0683 Snoring: Secondary | ICD-10-CM | POA: Diagnosis not present

## 2019-08-11 NOTE — Patient Instructions (Signed)
Will arrange for home sleep study Will call to arrange for follow up after sleep study reviewed  

## 2019-08-11 NOTE — Progress Notes (Signed)
[facility]  Chief Complaint  Patient presents with  . sleep consult    per Dr. Carlynn Purl- no prior sleep study- c/o loud snoring and stop breathing during sleep per husband.     Constitutional:  BP 128/72 (BP Location: Left Arm, Cuff Size: Normal)   Pulse 89   Temp (!) 97.2 F (36.2 C) (Temporal)   Ht 5\' 1"  (1.549 m)   Wt 191 lb 6.4 oz (86.8 kg)   SpO2 98%   BMI 36.16 kg/m   Past Medical History:  HLD, HTN, Anxiety, GERD, Allergies  Brief Summary:  [name] is a 53 y.o. female smoker with snoring.  She is followed by primary care for refractory hypertension.  Her husband reports she snores and stops breathing while asleep.  This has been going on for years.  Concerned she could have sleep apnea and referred for further assessment.  She goes to sleep at 10 pm.  She falls asleep in 15 minutes.  She wakes up some times to use the bathroom.  She gets out of bed at 640 am.  She feels okay in the morning.  She sometimes gets morning headache.  She does not use anything to help her fall sleep or stay awake.  She denies sleep walking, sleep talking, bruxism, or nightmares.  There is no history of restless legs.  She denies sleep hallucinations, sleep paralysis, or cataplexy.  The Epworth score is 12 out of 24.  She still smokes cigarettes.  Has script for chantix and nicotine patch, but hasn't started these yet.   Physical Exam:   Appearance - well kempt   ENMT - clear nasal mucosa, midline nasal  septum, no oral exudates, no LAN, trachea midline  Respiratory - normal chest wall, normal respiratory effort, no accessory muscle use, no wheeze/rales  CV - s1s2 regular rate and rhythm, no murmurs, no peripheral edema, radial pulses symmetric  GI - soft, non tender, no masses  Lymph - no adenopathy noted in neck and axillary areas  MSK - normal gait  Ext - no cyanosis, clubbing, or joint inflammation noted  Skin - no rashes,  lesions, or ulcers  Neuro - normal strength, oriented x 3  Psych - normal mood and affect  Discussion:  She has snoring, sleep disruption, apnea, and daytime sleepiness.  She has BMI > 35.  She has refractory hypertension on several medications.  I am concerned she could have obstructive sleep apnea.  Assessment/Plan:   Snoring with excessive daytime sleepiness. - will need to arrange for a home sleep study  Obesity. - discussed how weight can impact sleep and risk for sleep disordered breathing - discussed options to assist with weight loss: combination of diet modification, cardiovascular and strength training exercises  Cardiovascular risk. - had an extensive discussion regarding the adverse health consequences related to untreated sleep disordered breathing - specifically discussed the risks for hypertension, coronary artery disease, cardiac dysrhythmias, cerebrovascular disease, and diabetes - lifestyle modification discussed  Safe driving practices. - discussed how sleep disruption can increase risk of accidents, particularly when driving - safe driving practices were discussed  Therapies for obstructive sleep apnea. - if the sleep study shows significant sleep apnea, then various therapies for treatment were reviewed: CPAP, oral appliance, and surgical interventions      Patient Instructions  Will arrange for home sleep study Will call to arrange for follow up after sleep study reviewed    A total of  32  minutes were spent face to face and non-face to face with the patient and more than half of that time involved counseling or coordination of care.   Chesley Mires, MD [facility] Pager: [phone] 08/11/2019, 11:30 AM  Flow Sheet     Sleep tests:    Cardiac tests:  Echo 09/23/17 >> mod LVH, EF greater than 55%   Review of Systems:  Reviewed and negative  Medications:   Allergies as of 08/11/2019      Reactions   Penicillins  Itching   Has patient had a PCN reaction causing immediate rash, facial/tongue/throat swelling, SOB or lightheadedness with hypotension: No Has patient had a PCN reaction causing severe rash involving mucus membranes or skin necrosis: No Has patient had a PCN reaction that required hospitalization: No Has patient had a PCN reaction occurring within the last 10 years: No If all of the above answers are "NO", then may proceed with Cephalosporin use.      Medication List       Accurate as of August 11, 2019 11:30 AM. If you have any questions, ask your nurse or doctor.        aspirin EC 81 MG tablet Take 162 mg by mouth daily as needed for moderate pain.   atorvastatin 20 MG tablet Commonly known as: LIPITOR Take 1 tablet (20 mg total) by mouth daily.   famotidine 40 MG tablet Commonly known as: PEPCID Take 1 tablet (40 mg total) by mouth daily as needed for heartburn or indigestion.   fluticasone 50 MCG/ACT nasal spray Commonly known as: FLONASE Place 2 sprays into both nostrils daily.   hydrALAZINE 10 MG tablet Commonly known as: APRESOLINE Take 1 tablet (10 mg total) by mouth 3 (three) times daily.   nebivolol 10 MG tablet Commonly known as: Bystolic Take 1 tablet (10 mg total) by mouth daily.   Olmesartan-amLODIPine-HCTZ 40-10-25 MG Tabs Take 1 tablet by mouth daily.   omeprazole 40 MG capsule Commonly known as: PRILOSEC Take 1 capsule (40 mg total) by mouth daily.       Past Surgical History:  She  has a past surgical history that includes Abdominal hysterectomy; Cholecystectomy; and Ganglion cyst excision (Left, 03/01/2018).  Family History:  Her family history includes Hypertension in her father, mother, and sister; Stroke in her father.  Social History:  She  reports that she has been smoking cigarettes. She started smoking about 19 years ago. She has a 9.00 pack-year smoking history. She has never used smokeless tobacco. She reports current alcohol use of  about 1.0 standard drinks of alcohol per week. She reports that she does not use drugs.

## 2019-08-23 ENCOUNTER — Telehealth: Payer: Self-pay

## 2019-08-23 NOTE — Telephone Encounter (Signed)
Received a call from Dr. Russella Dar, OD, regarding patient [name] in their office for a regular eye exam. However, [name] BP was extremely high checking it twice. First time her BP was 218/124 and then second BP reading was 216/126. During her exam, Dr. Hanley Ben had noticed she had bleeding behind her eye secondary to high BP. Patient stated she took all her medication today and yesterday. And had no symptoms other than a throbbing temple headaches. Denied any chest pain or SOB. Dr. Carlynn Purl got on the telephone and spoke with Dr. Hanley Ben, informing her patient has a history of elevated BP and sees Cardiologist and just seen Pulmonologist this week and her BP there was normal. Also Dr. Carlynn Purl asked if the bleeding was something new, last eye exam was 2019. Dr. Carlynn Purl advised Dr. Hanley Ben to inform patient to keep a BP log twice daily for a record of her BP and to take a hydralazine 10 mg soon as she got home. Also if she started experiencing any neurological changes to go straight to the ER.

## 2019-09-05 ENCOUNTER — Other Ambulatory Visit: Payer: Self-pay

## 2019-09-05 ENCOUNTER — Ambulatory Visit: Payer: Managed Care, Other (non HMO)

## 2019-09-05 DIAGNOSIS — G4733 Obstructive sleep apnea (adult) (pediatric): Secondary | ICD-10-CM | POA: Diagnosis not present

## 2019-09-05 DIAGNOSIS — R0683 Snoring: Secondary | ICD-10-CM

## 2019-09-08 ENCOUNTER — Encounter: Payer: Self-pay | Admitting: Pulmonary Disease

## 2019-09-08 ENCOUNTER — Telehealth: Payer: Self-pay | Admitting: Pulmonary Disease

## 2019-09-08 DIAGNOSIS — G4733 Obstructive sleep apnea (adult) (pediatric): Secondary | ICD-10-CM

## 2019-09-08 HISTORY — DX: Obstructive sleep apnea (adult) (pediatric): G47.33

## 2019-09-08 NOTE — Telephone Encounter (Signed)
HST 09/05/19 >> AHI 18.2, SpO2 low 87%   Please inform her that her sleep study shows moderate obstructive sleep apnea.  Please arrange for ROV with me or NP to discuss treatment options.

## 2019-09-09 NOTE — Telephone Encounter (Signed)
Called the patient and made her aware of the results. Patient voiced understanding. Confirmed she has mychart access and comfortable with video visit.  Patient set up for video visit with Dr. Craige Cotta on 09/20/19. Nothing further needed at this time.

## 2019-09-19 ENCOUNTER — Other Ambulatory Visit: Payer: Self-pay

## 2019-09-19 ENCOUNTER — Encounter: Payer: Self-pay | Admitting: Family Medicine

## 2019-09-19 DIAGNOSIS — I10 Essential (primary) hypertension: Secondary | ICD-10-CM

## 2019-09-19 MED ORDER — OLMESARTAN-AMLODIPINE-HCTZ 40-10-25 MG PO TABS: 1.0000 | ORAL_TABLET | Freq: Every day | ORAL | 0 refills | Status: DC

## 2019-09-20 ENCOUNTER — Telehealth: Payer: Managed Care, Other (non HMO) | Admitting: Pulmonary Disease

## 2019-09-20 NOTE — Telephone Encounter (Signed)
appt scheduled for first availability 4.1.2021 with Dr Carlynn Purl

## 2019-09-23 ENCOUNTER — Telehealth (INDEPENDENT_AMBULATORY_CARE_PROVIDER_SITE_OTHER): Payer: Managed Care, Other (non HMO) | Admitting: Pulmonary Disease

## 2019-09-23 ENCOUNTER — Encounter: Payer: Self-pay | Admitting: Pulmonary Disease

## 2019-09-23 VITALS — Ht 61.0 in | Wt 191.0 lb

## 2019-09-23 DIAGNOSIS — Z7189 Other specified counseling: Secondary | ICD-10-CM

## 2019-09-23 DIAGNOSIS — G4733 Obstructive sleep apnea (adult) (pediatric): Secondary | ICD-10-CM | POA: Diagnosis not present

## 2019-09-23 NOTE — Patient Instructions (Signed)
Will arrange for auto CPAP set up  Follow up in 2 months 

## 2019-09-23 NOTE — Progress Notes (Signed)
[facility]  Chief Complaint  Patient presents with  . Follow-up    Sleep study results    Constitutional:  Ht 5\' 1"  (1.549 m)   Wt 191 lb (86.6 kg)   BMI 36.09 kg/m   Past Medical History:  HLD, HTN, Anxiety, GERD, Allergies  Brief Summary:  [name] is a 54 y.o. female smoker with snoring.  Virtual Visit via Video Note  I connected with [name] on 09/23/19 at 10:00 AM EST by a video enabled telemedicine application and verified that I am speaking with the correct person using two identifiers.  Location: Patient: home Provider: medical office   I discussed the limitations of evaluation and management by telemedicine and the availability of in person appointments. The patient expressed understanding and agreed to proceed.  She had home sleep study.  Moderate OSA.  No issues with teeth or TMJ.  Still snoring.  She is familiar with CPAP - has friend who uses machine.   Physical Exam:  Pleasant.  Normal mood/behavior.  No respiratory distress.  Assessment/Plan:   Obstructive sleep apnea. - reviewed her sleep study results with her - discussed how untreated sleep apnea can impact her health - driving precautions reviewed - importance of weight loss discussed - treatment options reviewed - will arrange for auto CPAP set up - discussed techniques to optimize mask fit and tolerance of CPAP set up   Patient Instructions  Will arrange for auto CPAP set up  Follow up in 2 months    I discussed the assessment and treatment plan with the patient. The patient was provided an opportunity to ask questions and all were answered. The patient agreed with the plan and demonstrated an understanding of the instructions.   The patient was advised to call back or seek an in-person evaluation if the symptoms worsen or if the condition fails to improve as anticipated.  I provided 23 minutes of non-face-to-face time during this  encounter.    Chesley Mires, MD [facility] Pager: [phone] 09/23/2019, 10:24 AM  Flow Sheet     Sleep tests:  HST 09/05/19 >> AHI 18.2, SpO2 low 87%  Cardiac tests:  Echo 09/23/17 >> mod LVH, EF greater than 55%  Medications:   Allergies as of 09/23/2019      Reactions   Penicillins Itching   Has patient had a PCN reaction causing immediate rash, facial/tongue/throat swelling, SOB or lightheadedness with hypotension: No Has patient had a PCN reaction causing severe rash involving mucus membranes or skin necrosis: No Has patient had a PCN reaction that required hospitalization: No Has patient had a PCN reaction occurring within the last 10 years: No If all of the above answers are "NO", then may proceed with Cephalosporin use.      Medication List       Accurate as of September 23, 2019 10:24 AM. If you have any questions, ask your nurse or doctor.        aspirin EC 81 MG tablet Take 162 mg by mouth daily as needed for moderate pain.   atorvastatin 20 MG tablet Commonly known as: LIPITOR Take 1 tablet (20 mg total) by mouth daily.   famotidine 40 MG tablet Commonly known as: PEPCID Take 1 tablet (40 mg total) by mouth daily as needed for heartburn or indigestion.   fluticasone 50 MCG/ACT nasal spray Commonly known as: FLONASE Place 2 sprays into both nostrils daily.   hydrALAZINE 10 MG tablet  Commonly known as: APRESOLINE Take 1 tablet (10 mg total) by mouth 3 (three) times daily.   nebivolol 10 MG tablet Commonly known as: Bystolic Take 1 tablet (10 mg total) by mouth daily.   Olmesartan-amLODIPine-HCTZ 40-10-25 MG Tabs Take 1 tablet by mouth daily.   omeprazole 40 MG capsule Commonly known as: PRILOSEC Take 1 capsule (40 mg total) by mouth daily.       Past Surgical History:  She  has a past surgical history that includes Abdominal hysterectomy; Cholecystectomy; and Ganglion cyst excision (Left, 03/01/2018).  Family History:    Her family history includes Hypertension in her father, mother, and sister; Stroke in her father.  Social History:  She  reports that she has been smoking cigarettes. She started smoking about 20 years ago. She has a 9.00 pack-year smoking history. She has never used smokeless tobacco. She reports current alcohol use of about 1.0 standard drinks of alcohol per week. She reports that she does not use drugs.

## 2019-10-10 IMAGING — CR DG CHEST 2V
1 series · 2 of 2 positions shown · non-contrast
Comparison: 03/24/2017

CLINICAL DATA: Chest pain

EXAM:
CHEST  2 VIEW

[Series 1: dg chest 2 view · 0.14mm/px · 2 of 2 slices shown]
[im 1/2]
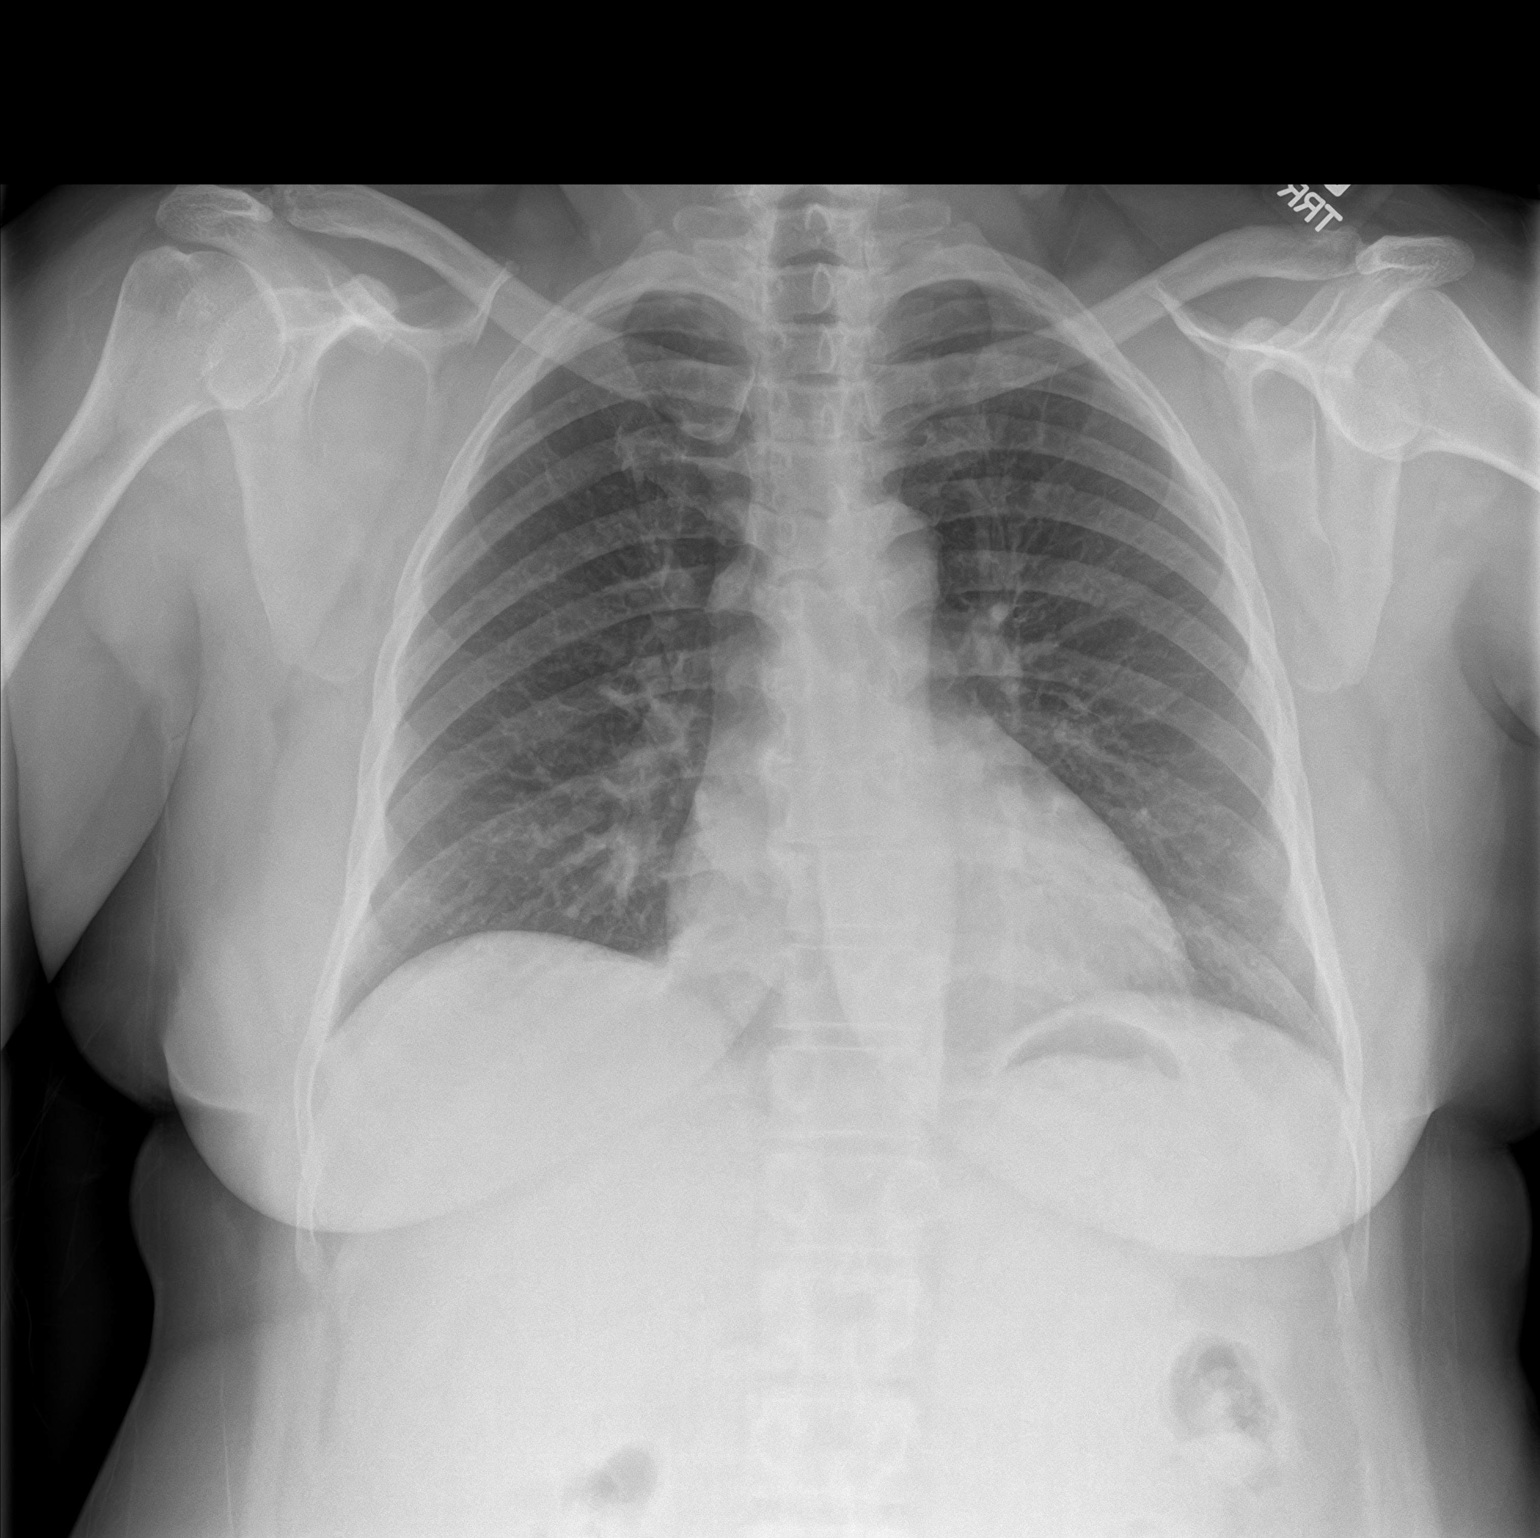
[im 2/2]
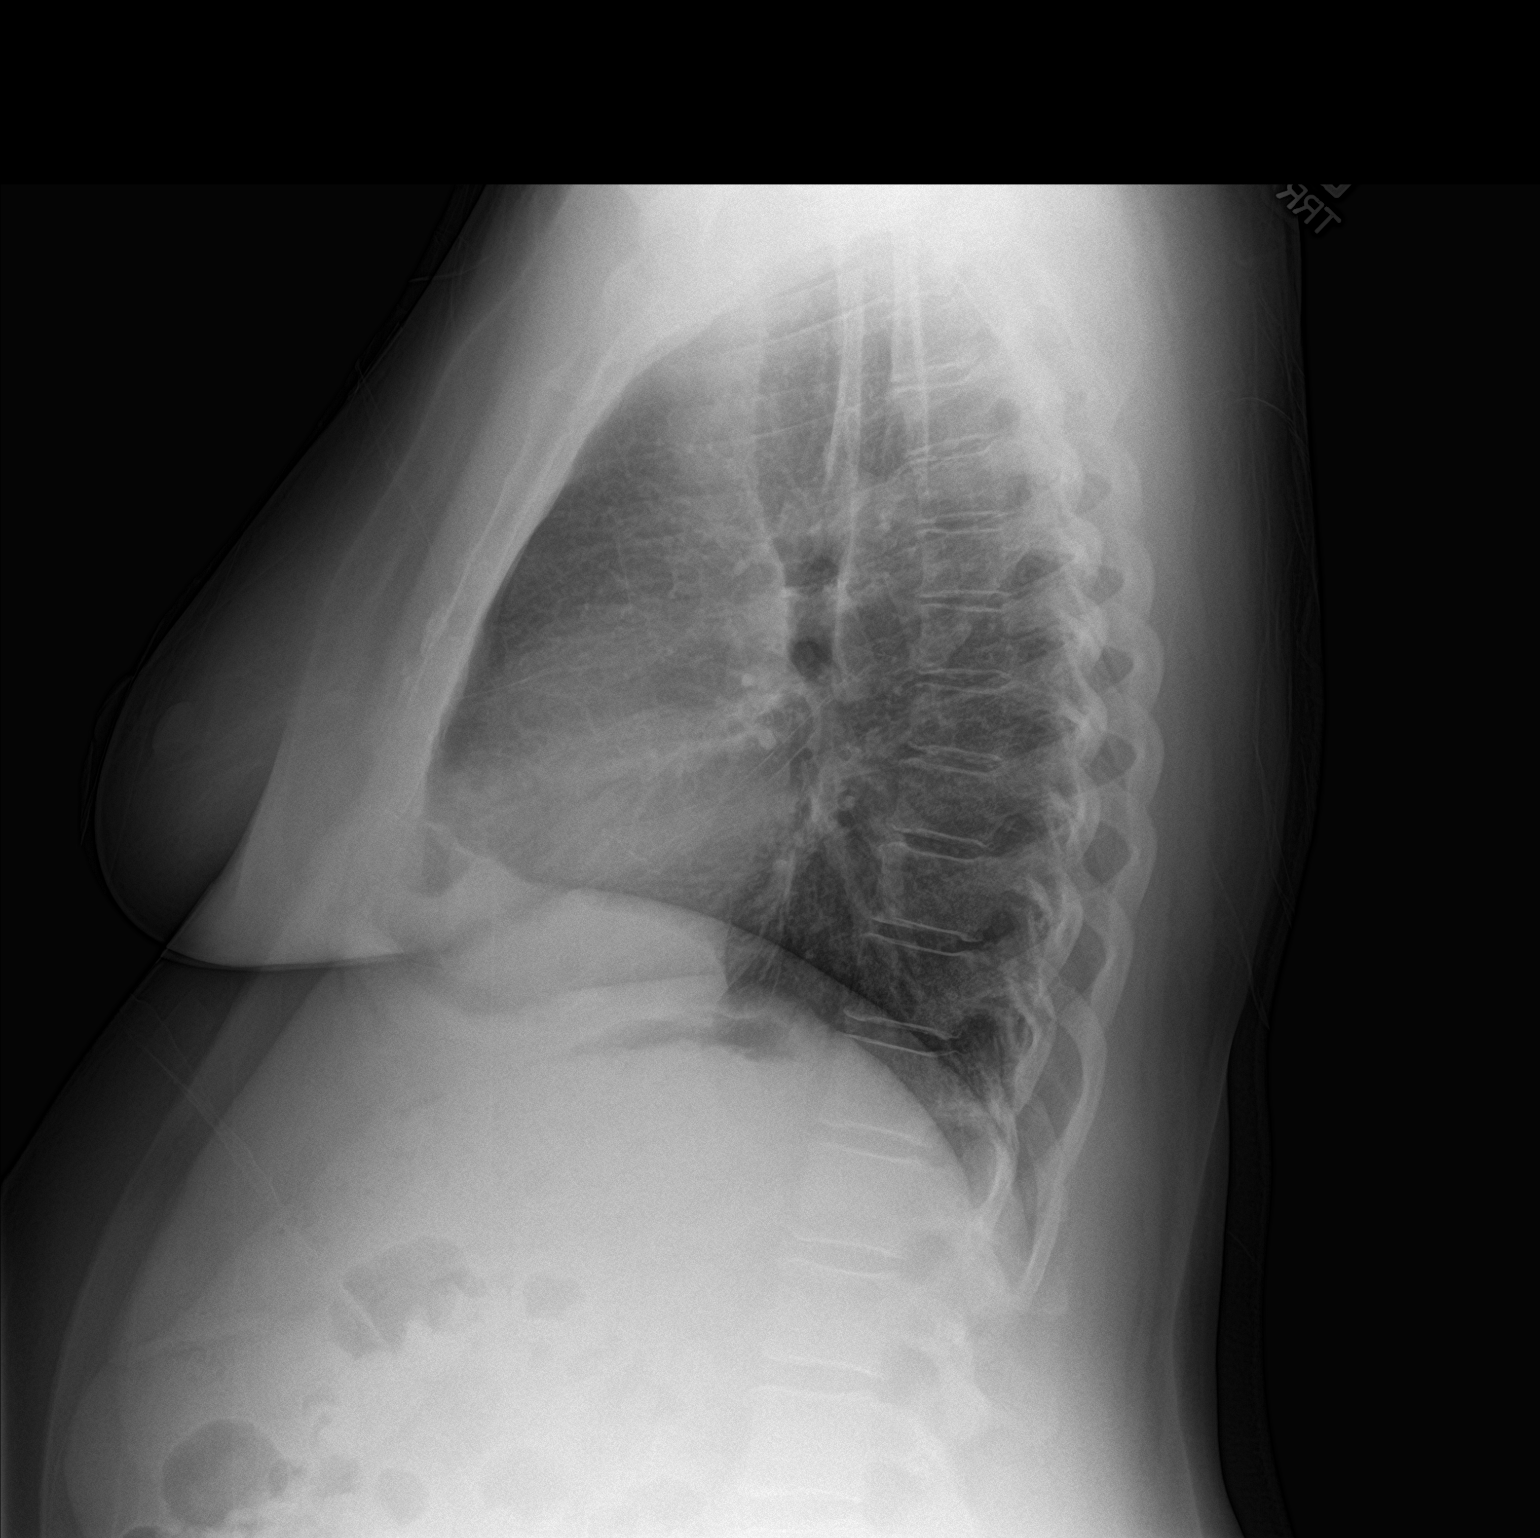

[2 of 2 positions shown; findings below may reference images not displayed]

FINDINGS: The heart size and mediastinal contours are within normal limits.
Both lungs are clear. The visualized skeletal structures are
unremarkable.
IMPRESSION: No active cardiopulmonary disease.

## 2019-10-27 ENCOUNTER — Ambulatory Visit (INDEPENDENT_AMBULATORY_CARE_PROVIDER_SITE_OTHER): Payer: Managed Care, Other (non HMO) | Admitting: Family Medicine

## 2019-10-27 ENCOUNTER — Encounter: Payer: Self-pay | Admitting: Family Medicine

## 2019-10-27 ENCOUNTER — Other Ambulatory Visit: Payer: Self-pay

## 2019-10-27 VITALS — BP 118/74 | HR 100 | Temp 97.5°F | Resp 16 | Ht 62.0 in | Wt 185.8 lb

## 2019-10-27 DIAGNOSIS — J3089 Other allergic rhinitis: Secondary | ICD-10-CM

## 2019-10-27 DIAGNOSIS — E669 Obesity, unspecified: Secondary | ICD-10-CM

## 2019-10-27 DIAGNOSIS — K219 Gastro-esophageal reflux disease without esophagitis: Secondary | ICD-10-CM

## 2019-10-27 DIAGNOSIS — I119 Hypertensive heart disease without heart failure: Secondary | ICD-10-CM

## 2019-10-27 DIAGNOSIS — E7800 Pure hypercholesterolemia, unspecified: Secondary | ICD-10-CM

## 2019-10-27 DIAGNOSIS — I10 Essential (primary) hypertension: Secondary | ICD-10-CM

## 2019-10-27 DIAGNOSIS — R7303 Prediabetes: Secondary | ICD-10-CM

## 2019-10-27 DIAGNOSIS — G4733 Obstructive sleep apnea (adult) (pediatric): Secondary | ICD-10-CM

## 2019-10-27 DIAGNOSIS — F411 Generalized anxiety disorder: Secondary | ICD-10-CM

## 2019-10-27 MED ORDER — FLUTICASONE PROPIONATE 50 MCG/ACT NA SUSP: 2.0000 | Freq: Every day | NASAL | 0 refills | Status: DC

## 2019-10-27 MED ORDER — ATORVASTATIN CALCIUM 20 MG PO TABS: 20.0000 mg | ORAL_TABLET | Freq: Every day | ORAL | 1 refills | Status: DC

## 2019-10-27 MED ORDER — FLUOXETINE HCL 10 MG PO TABS: 10.0000 mg | ORAL_TABLET | Freq: Every day | ORAL | 0 refills | Status: DC

## 2019-10-27 MED ORDER — OLMESARTAN-AMLODIPINE-HCTZ 40-10-25 MG PO TABS: 1.0000 | ORAL_TABLET | Freq: Every day | ORAL | 1 refills | Status: DC

## 2019-10-27 NOTE — Progress Notes (Signed)
Name: [name]   MRN: [id number]    DOB: May 29, 1966   Date:10/27/2019       Progress Note  Subjective  Chief Complaint  Chief Complaint  Patient presents with  . Medication Refill  . Gastroesophageal Reflux    Well controlled  . Hypertension    Has been taking medication every day and doing well with her BP  . Anxiety  . Pre-diabetes    HPI  PHX:TAVWP bp is excellent, she has been compliant with medication since it is simple to take. No headaches, chest pain or palpitation. She states bp at home is at goal, still has hydralazine to take prn.   Anxiety: she continues to feel anxious. Symptoms started when her son went to prison around 2014 for selling drugs, he is out, doing well, has a business but she is always worried about him. She gets up at night and calls to check on him and wakes him up from his sleep. Her husband also tells her to stop worrying. She would like to try medication to calm her down. She wants to try Prozac   GERD: she states only flaring with dietary indiscretion.She is off Pepcid and has been taking Omeprazole prn and doing well  Pre-diabetes: she denies polyphagia, polyuria or polydipsia. Reviewed last labs   OSA: also has LVH, uncontrolled hypertension, interrupted sleep- She went to pulmonologist - Dr. Craige Cotta , has moderate OSA and is waiting for CPAP machine     Patient Active Problem List   Diagnosis Date Noted  . OSA (obstructive sleep apnea) 09/08/2019  . Ganglion cyst of dorsum of left wrist 02/05/2018  . LVH (left ventricular hypertrophy) due to hypertensive disease, without heart failure 09/23/2017  . SOBOE (shortness of breath on exertion) 09/23/2017  . Hyperlipidemia, mixed 08/28/2017  . Hyperglycemia 08/25/2017  . Hypercalcemia due to thiazide and vitamin A 08/25/2017  . Hypertension 08/18/2017  . GAD (generalized anxiety disorder) 08/18/2017  . GERD (gastroesophageal reflux disease) 08/18/2017  . Panic attack 08/18/2017  .  Tobacco use 08/18/2017  . Systolic ejection murmur 08/18/2017    Past Surgical History:  Procedure Laterality Date  . ABDOMINAL HYSTERECTOMY     Complete Hysterectomy  . CHOLECYSTECTOMY    . GANGLION CYST EXCISION Left 03/01/2018   Procedure: REMOVAL GANGLION OF WRIST;  Surgeon: Deeann Saint, MD;  Location: [facility] ORS;  Service: Orthopedics;  Laterality: Left;    Family History  Problem Relation Age of Onset  . Hypertension Mother   . Stroke Father   . Hypertension Father   . Hypertension Sister   . Breast cancer Neg Hx     Social History   Tobacco Use  . Smoking status: Current Every Day Smoker    Packs/day: 0.50    Years: 18.00    Pack years: 9.00    Types: Cigarettes    Start date: 08/19/1999  . Smokeless tobacco: Never Used  Substance Use Topics  . Alcohol use: Yes    Alcohol/week: 1.0 standard drinks    Types: 1 Glasses of wine per week     Current Outpatient Medications:  .  aspirin EC 81 MG tablet, Take 162 mg by mouth daily as needed for moderate pain. , Disp: , Rfl:  .  fluticasone (FLONASE) 50 MCG/ACT nasal spray, Place 2 sprays into both nostrils daily., Disp: 16 g, Rfl: 0 .  hydrALAZINE (APRESOLINE) 10 MG tablet, Take 1 tablet (10 mg total) by mouth 3 (three) times daily., Disp: 270 tablet, Rfl:  0 .  Olmesartan-amLODIPine-HCTZ 40-10-25 MG TABS, Take 1 tablet by mouth daily., Disp: 90 tablet, Rfl: 1 .  omeprazole (PRILOSEC) 40 MG capsule, Take 1 capsule (40 mg total) by mouth daily., Disp: 30 capsule, Rfl: 0 .  atorvastatin (LIPITOR) 20 MG tablet, Take 1 tablet (20 mg total) by mouth daily., Disp: 90 tablet, Rfl: 1 .  FLUoxetine (PROZAC) 10 MG tablet, Take 1 tablet (10 mg total) by mouth daily., Disp: 90 tablet, Rfl: 0  Allergies  Allergen Reactions  . Penicillins Itching    Has patient had a PCN reaction causing immediate rash, facial/tongue/throat swelling, SOB or lightheadedness with hypotension: No Has patient had a PCN reaction causing severe rash  involving mucus membranes or skin necrosis: No Has patient had a PCN reaction that required hospitalization: No Has patient had a PCN reaction occurring within the last 10 years: No If all of the above answers are "NO", then may proceed with Cephalosporin use.     I personally reviewed active problem list, medication list, allergies, family history, social history with the patient/caregiver today.   ROS  Constitutional: Negative for fever or weight change.  Respiratory: Negative for cough and shortness of breath.   Cardiovascular: Negative for chest pain or palpitations.  Gastrointestinal: Negative for abdominal pain, no bowel changes.  Musculoskeletal: Negative for gait problem or joint swelling.  Skin: Negative for rash.  Neurological: Negative for dizziness or headache.  No other specific complaints in a complete review of systems (except as listed in HPI above).  Objective  Vitals:   10/27/19 0948  BP: 118/74  Pulse: 100  Resp: 16  Temp: (!) 97.5 F (36.4 C)  TempSrc: Temporal  SpO2: 98%  Weight: 185 lb 12.8 oz (84.3 kg)  Height: 5\' 2"  (1.575 m)    Body mass index is 33.98 kg/m.  Physical Exam  Constitutional: Patient appears well-developed and well-nourished. Obese No distress.  HEENT: head atraumatic, normocephalic, pupils equal and reactive to light Cardiovascular: Normal rate, regular rhythm and normal heart sounds.  No murmur heard. No BLE edema. Pulmonary/Chest: Effort normal and breath sounds normal. No respiratory distress. Abdominal: Soft.  There is no tenderness. Psychiatric: Patient has a normal mood and affect. behavior is normal. Judgment and thought content normal.   PHQ2/9: Depression screen [facility] 2/9 10/27/2019 08/01/2019 06/14/2019 03/03/2019 12/22/2018  Decreased Interest 0 0 0 0 0  Down, Depressed, Hopeless 0 0 0 0 0  PHQ - 2 Score 0 0 0 0 0  Altered sleeping 0 0 0 0 0  Tired, decreased energy 0 0 0 0 0  Change in appetite 0 0 0 0 0  Feeling bad  or failure about yourself  0 0 0 0 0  Trouble concentrating 0 0 0 0 0  Moving slowly or fidgety/restless 0 0 0 0 0  Suicidal thoughts 0 0 0 0 0  PHQ-9 Score 0 0 0 0 0  Difficult doing work/chores - - - - Not difficult at all    phq 9 is negative  Fall Risk: Fall Risk  10/27/2019 08/01/2019 06/14/2019 03/03/2019 12/22/2018  Falls in the past year? 0 0 0 0 0  Number falls in past yr: 0 0 0 0 0  Injury with Fall? 0 0 0 0 0     Functional Status Survey: Is the patient deaf or have difficulty hearing?: No Does the patient have difficulty seeing, even when wearing glasses/contacts?: No Does the patient have difficulty concentrating, remembering, or making decisions?: No Does  the patient have difficulty walking or climbing stairs?: No Does the patient have difficulty dressing or bathing?: No Does the patient have difficulty doing errands alone such as visiting a doctor's office or shopping?: No    Assessment & Plan  1. Gastroesophageal reflux disease without esophagitis  Doing well   2. Benign essential hypertension  - Olmesartan-amLODIPine-HCTZ 40-10-25 MG TABS; Take 1 tablet by mouth daily.  Dispense: 90 tablet; Refill: 1  3. Obesity (BMI 30.0-34.9)  Discussed with the patient the risk posed by an increased BMI. Discussed importance of portion control, calorie counting and at least 150 minutes of physical activity weekly. Avoid sweet beverages and drink more water. Eat at least 6 servings of fruit and vegetables daily   4. Environmental and seasonal allergies  - fluticasone (FLONASE) 50 MCG/ACT nasal spray; Place 2 sprays into both nostrils daily.  Dispense: 16 g; Refill: 0  5. Pure hypercholesterolemia  - atorvastatin (LIPITOR) 20 MG tablet; Take 1 tablet (20 mg total) by mouth daily.  Dispense: 90 tablet; Refill: 1  6. Pre-diabetes   7. GAD (generalized anxiety disorder)  - FLUoxetine (PROZAC) 10 MG tablet; Take 1 tablet (10 mg total) by mouth daily.  Dispense: 90 tablet;  Refill: 0  8. LVH (left ventricular hypertrophy) due to hypertensive disease, without heart failure   9. OSA (obstructive sleep apnea)  Seen by pulmonologist

## 2019-11-07 ENCOUNTER — Other Ambulatory Visit: Payer: Self-pay | Admitting: Family Medicine

## 2019-11-07 ENCOUNTER — Encounter: Payer: Self-pay | Admitting: Family Medicine

## 2019-11-22 ENCOUNTER — Other Ambulatory Visit: Payer: Self-pay | Admitting: Family Medicine

## 2019-11-22 DIAGNOSIS — Z1231 Encounter for screening mammogram for malignant neoplasm of breast: Secondary | ICD-10-CM

## 2019-12-05 ENCOUNTER — Ambulatory Visit
Admission: RE | Admit: 2019-12-05 | Discharge: 2019-12-05 | Disposition: A | Discharge status: Home / Self Care | Payer: Managed Care, Other (non HMO) | Source: Ambulatory Visit | Attending: Family Medicine | Admitting: Family Medicine

## 2019-12-05 DIAGNOSIS — Z1231 Encounter for screening mammogram for malignant neoplasm of breast: Secondary | ICD-10-CM | POA: Insufficient documentation

## 2020-01-09 ENCOUNTER — Other Ambulatory Visit: Payer: Self-pay

## 2020-01-09 ENCOUNTER — Ambulatory Visit (INDEPENDENT_AMBULATORY_CARE_PROVIDER_SITE_OTHER): Payer: Managed Care, Other (non HMO) | Admitting: Family Medicine

## 2020-01-09 ENCOUNTER — Encounter: Payer: Self-pay | Admitting: Family Medicine

## 2020-01-09 VITALS — BP 124/78 | HR 97 | Temp 97.9°F | Resp 18 | Ht 62.0 in | Wt 189.4 lb

## 2020-01-09 DIAGNOSIS — E66811 Obesity, class 1: Secondary | ICD-10-CM

## 2020-01-09 DIAGNOSIS — F411 Generalized anxiety disorder: Secondary | ICD-10-CM

## 2020-01-09 DIAGNOSIS — E669 Obesity, unspecified: Secondary | ICD-10-CM

## 2020-01-09 MED ORDER — SAXENDA 18 MG/3ML ~~LOC~~ SOPN: 0.6000 mg | PEN_INJECTOR | Freq: Every day | SUBCUTANEOUS | 0 refills | Status: DC

## 2020-01-09 MED ORDER — FLUOXETINE HCL 10 MG PO TABS: 10.0000 mg | ORAL_TABLET | Freq: Every day | ORAL | 0 refills | Status: DC

## 2020-01-09 MED ORDER — NOVOFINE 32G X 6 MM MISC: 1.0000 | Freq: Every day | 2 refills | Status: DC

## 2020-01-09 NOTE — Progress Notes (Signed)
Name: [name]   MRN: [id number]    DOB: [birth year]   Date:01/09/2020       Progress Note  Subjective  Chief Complaint  Chief Complaint  Patient presents with  . Weight Loss    HPI  Obesity: BMI above 30 but right under 35. She was going to the weight loss clinic, she states phentermine worked well for her, but since she has been off medication bp is at goal. She states she stopped because of cost. She became obese in her early 20's and she has tried multiple diets. Such as Weight Watchers, cabbage diet, Atkins , she loses weight on her diet but gains it back. . She tried Ozempic but stopped on her own , she states her friend is taking [place] and she would like to try it . Discussed possible side effects, she denies personal history of pancreatitis or family history of thyroid cancer.    Patient Active Problem List   Diagnosis Date Noted  . OSA (obstructive sleep apnea) 09/08/2019  . Ganglion cyst of dorsum of left wrist 02/05/2018  . LVH (left ventricular hypertrophy) due to hypertensive disease, without heart failure 09/23/2017  . SOBOE (shortness of breath on exertion) 09/23/2017  . Hyperlipidemia, mixed 08/28/2017  . Hyperglycemia 08/25/2017  . Hypercalcemia due to thiazide and vitamin A 08/25/2017  . Hypertension 08/18/2017  . GAD (generalized anxiety disorder) 08/18/2017  . GERD (gastroesophageal reflux disease) 08/18/2017  . Panic attack 08/18/2017  . Tobacco use 08/18/2017  . Systolic ejection murmur 83/41/9622    Past Surgical History:  Procedure Laterality Date  . ABDOMINAL HYSTERECTOMY     Complete Hysterectomy  . CHOLECYSTECTOMY    . GANGLION CYST EXCISION Left 03/01/2018   Procedure: REMOVAL GANGLION OF WRIST;  Surgeon: Earnestine Leys, MD;  Location: [facility] ORS;  Service: Orthopedics;  Laterality: Left;    Family History  Problem Relation Age of Onset  . Hypertension Mother   . Stroke Father   . Hypertension Father   . Hypertension Sister   . Breast  cancer Neg Hx     Social History   Tobacco Use  . Smoking status: Current Every Day Smoker    Packs/day: 0.50    Years: 18.00    Pack years: 9.00    Types: Cigarettes    Start date: 08/19/1999  . Smokeless tobacco: Never Used  Substance Use Topics  . Alcohol use: Yes    Alcohol/week: 1.0 standard drink    Types: 1 Glasses of wine per week     Current Outpatient Medications:  .  aspirin EC 81 MG tablet, Take 162 mg by mouth daily as needed for moderate pain. , Disp: , Rfl:  .  atorvastatin (LIPITOR) 20 MG tablet, Take 1 tablet (20 mg total) by mouth daily., Disp: 90 tablet, Rfl: 1 .  FLUoxetine (PROZAC) 10 MG tablet, Take 1 tablet (10 mg total) by mouth daily., Disp: 90 tablet, Rfl: 0 .  fluticasone (FLONASE) 50 MCG/ACT nasal spray, Place 2 sprays into both nostrils daily., Disp: 16 g, Rfl: 0 .  hydrALAZINE (APRESOLINE) 10 MG tablet, Take 1 tablet (10 mg total) by mouth 3 (three) times daily., Disp: 270 tablet, Rfl: 0 .  omeprazole (PRILOSEC) 40 MG capsule, Take 1 capsule (40 mg total) by mouth daily., Disp: 30 capsule, Rfl: 0 .  Olmesartan-amLODIPine-HCTZ 40-10-25 MG TABS, Take 1 tablet by mouth daily. (Patient not taking: Reported on 01/09/2020), Disp: 90 tablet, Rfl: 1  Allergies  Allergen Reactions  .  Penicillins Itching    Has patient had a PCN reaction causing immediate rash, facial/tongue/throat swelling, SOB or lightheadedness with hypotension: No Has patient had a PCN reaction causing severe rash involving mucus membranes or skin necrosis: No Has patient had a PCN reaction that required hospitalization: No Has patient had a PCN reaction occurring within the last 10 years: No If all of the above answers are "NO", then may proceed with Cephalosporin use.     I personally reviewed active problem list, medication list, allergies, family history, social history, health maintenance with the patient/caregiver today.   ROS  Constitutional: Negative for fever or weight  change.  Respiratory: Negative for cough and shortness of breath.   Cardiovascular: Negative for chest pain or palpitations.  Gastrointestinal: Negative for abdominal pain, no bowel changes.  Musculoskeletal: Negative for gait problem or joint swelling.  Skin: Negative for rash.  Neurological: Negative for dizziness or headache.  No other specific complaints in a complete review of systems (except as listed in HPI above).  Objective  Vitals:   01/09/20 1455  BP: 124/78  Pulse: 97  Resp: 18  Temp: 97.9 F (36.6 C)  TempSrc: Temporal  SpO2: 98%  Weight: 189 lb 6.4 oz (85.9 kg)  Height: 5\' 2"  (1.575 m)    Body mass index is 34.64 kg/m.  Physical Exam  Constitutional: Patient appears well-developed and well-nourished. Obese  No distress.  HEENT: head atraumatic, normocephalic, pupils equal and reactive to light, neck supple Cardiovascular: Normal rate, regular rhythm and normal heart sounds.  No murmur heard. No BLE edema. Pulmonary/Chest: Effort normal and breath sounds normal. No respiratory distress. Abdominal: Soft.  There is no tenderness. Psychiatric: Patient has a normal mood and affect. behavior is normal. Judgment and thought content normal.  PHQ2/9: Depression screen [facility] 2/9 01/09/2020 10/27/2019 08/01/2019 06/14/2019 03/03/2019  Decreased Interest 0 0 0 0 0  Down, Depressed, Hopeless 0 0 0 0 0  PHQ - 2 Score 0 0 0 0 0  Altered sleeping 0 0 0 0 0  Tired, decreased energy 0 0 0 0 0  Change in appetite 0 0 0 0 0  Feeling bad or failure about yourself  0 0 0 0 0  Trouble concentrating 0 0 0 0 0  Moving slowly or fidgety/restless 0 0 0 0 0  Suicidal thoughts 0 0 0 0 0  PHQ-9 Score 0 0 0 0 0  Difficult doing work/chores - - - - -    phq 9 is negative  Fall Risk: Fall Risk  01/09/2020 10/27/2019 08/01/2019 06/14/2019 03/03/2019  Falls in the past year? 0 0 0 0 0  Number falls in past yr: 0 0 0 0 0  Injury with Fall? - 0 0 0 0    Functional Status Survey: Is the patient  deaf or have difficulty hearing?: No Does the patient have difficulty seeing, even when wearing glasses/contacts?: No Does the patient have difficulty concentrating, remembering, or making decisions?: No Does the patient have difficulty dressing or bathing?: No Does the patient have difficulty doing errands alone such as visiting a doctor's office or shopping?: No    Assessment & Plan   1. Obesity (BMI 30.0-34.9)  - Liraglutide -Weight Management (SAXENDA) 18 MG/3ML SOPN; Inject 0.1-0.5 mLs (0.6-3 mg total) into the skin daily. Start at 0.6 mg and go up by 0.6 weekly to max of 3 mg daily  Dispense: 15 mL; Refill: 0 - Insulin Pen Needle (NOVOFINE) 32G X 6 MM MISC; 1  each by Does not apply route daily.  Dispense: 100 each; Refill: 2  2. GAD (generalized anxiety disorder)  Doing well on medication  - FLUoxetine (PROZAC) 10 MG tablet; Take 1 tablet (10 mg total) by mouth daily.  Dispense: 90 tablet; Refill: 0

## 2020-01-10 ENCOUNTER — Telehealth: Payer: Self-pay | Admitting: Family Medicine

## 2020-01-10 NOTE — Telephone Encounter (Incomplete)
[facility] called in and stated that they are needing more info to process the PA for the Liraglutide -Weight Management (SAXENDA)  Needing BMI  If the drug is using for life style modification  Best number [phone] Ref number [id number]

## 2020-01-11 ENCOUNTER — Encounter: Payer: Self-pay | Admitting: Family Medicine

## 2020-01-16 NOTE — Telephone Encounter (Signed)
Patient called to check the status of her medication Saxenda.  Please call to discuss at [phone]

## 2020-01-18 NOTE — Telephone Encounter (Signed)
Prior Authorization was initiated for Saxenda and approved. The medication is approved through 07/18/2020.

## 2020-03-05 ENCOUNTER — Other Ambulatory Visit: Payer: Self-pay | Admitting: Family Medicine

## 2020-03-05 DIAGNOSIS — E669 Obesity, unspecified: Secondary | ICD-10-CM

## 2020-03-05 MED ORDER — SAXENDA 18 MG/3ML ~~LOC~~ SOPN: 0.6000 mg | PEN_INJECTOR | Freq: Every day | SUBCUTANEOUS | 0 refills | Status: DC

## 2020-04-09 NOTE — Progress Notes (Signed)
Name: [name]   MRN: [id number]    DOB: 07-Aug-1965   Date:04/10/2020       Progress Note  Subjective  Chief Complaint  Follow up/Medication Refill  Obesity: BMI above 30 but right under 35. She was going to the weight loss clinic, she states phentermine worked well for her, but since she has been off medication bp is at goal. She states she stopped going to the weight loss clinic because of cost. She became obese in her early 20's and she has tried multiple diets. Such as Weight Watchers, cabbage diet, Atkins , she loses weight on her diet but gains it back. . She tried Ozempic but stopped on her own , she came in in June and stated a friend was taking [place] and we started her on medication, she is on 3 mg, she is tolerating medication well, she lost 16 lbs and states it has curbed her appetite , making better food choices and is very happy with results.   Thigh pain: going on for about one month, she states works from home and noticed that when she gets up after sitting for a prolonged of period of thigh she has stiffness, and inner thigh and quad muscles feels tight and sore, not sore to touch, she needs to take slow steps to feel better and at times able to walk normally. She states not back pain, no knee pain, no groin pain, but has inner thigh pain. No rashes or bruises, no swelling. She states symptoms better with BC arthritis medication. Worse with activity or prolonged sitting. No bowel or bladder incontinence . She has Atorvastatin but has not been taking medication for months - therefore not statin related   FTD:DUKGU bp is excellent, she has been compliant with medication since it is simple to take. No headaches, chest pain or palpitation. She states bp at home is at goal, no longer needs hydralazine   Anxiety: she continues to feel anxious. Symptoms started when her son went to prison around 2014 for selling drugs, he is out of jail, but he was in a fight and got arrested and  they bailed him out, waiting for court date. She gets up at night and calls to check on him and wakes him up from his sleep. We gave her Paxil but not taking it daily   GERD: she states only flaring with dietary indiscretion.She is off Pepcid and has been taking Omeprazole prn. She has noticed some chest pain at night - she takes bayer aspirin, explained likely stress versus reflux, raise the head of bed and resume omeprazole daily to see if it helps   Pre-diabetes: she denies polyphagia, polyuria or polydipsia. Losing weight, happy with it, we will recheck labs   OSA: also has LVH, uncontrolled hypertension, interrupted sleep- She went to pulmonologist - Dr. Craige Cotta , has moderate OSA, she has not checked about supplies     Patient Active Problem List   Diagnosis Date Noted  . OSA (obstructive sleep apnea) 09/08/2019  . Ganglion cyst of dorsum of left wrist 02/05/2018  . LVH (left ventricular hypertrophy) due to hypertensive disease, without heart failure 09/23/2017  . SOBOE (shortness of breath on exertion) 09/23/2017  . Hyperlipidemia, mixed 08/28/2017  . Hyperglycemia 08/25/2017  . Hypercalcemia due to thiazide and vitamin A 08/25/2017  . Hypertension 08/18/2017  . GAD (generalized anxiety disorder) 08/18/2017  . GERD (gastroesophageal reflux disease) 08/18/2017  . Panic attack 08/18/2017  . Tobacco use 08/18/2017  .  Systolic ejection murmur 08/18/2017    Past Surgical History:  Procedure Laterality Date  . ABDOMINAL HYSTERECTOMY     Complete Hysterectomy  . CHOLECYSTECTOMY    . GANGLION CYST EXCISION Left 03/01/2018   Procedure: REMOVAL GANGLION OF WRIST;  Surgeon: Deeann Saint, MD;  Location: [facility] ORS;  Service: Orthopedics;  Laterality: Left;    Family History  Problem Relation Age of Onset  . Hypertension Mother   . Stroke Father   . Hypertension Father   . Hypertension Sister   . Breast cancer Neg Hx     Social History   Tobacco Use  . Smoking status:  Current Every Day Smoker    Packs/day: 0.50    Years: 18.00    Pack years: 9.00    Types: Cigarettes    Start date: 08/19/1999  . Smokeless tobacco: Never Used  Substance Use Topics  . Alcohol use: Yes    Alcohol/week: 1.0 standard drink    Types: 1 Glasses of wine per week     Current Outpatient Medications:  .  aspirin EC 81 MG tablet, Take 162 mg by mouth daily as needed for moderate pain. , Disp: , Rfl:  .  atorvastatin (LIPITOR) 20 MG tablet, Take 1 tablet (20 mg total) by mouth daily., Disp: 90 tablet, Rfl: 1 .  FLUoxetine (PROZAC) 10 MG tablet, Take 1 tablet (10 mg total) by mouth daily., Disp: 90 tablet, Rfl: 0 .  fluticasone (FLONASE) 50 MCG/ACT nasal spray, Place 2 sprays into both nostrils daily., Disp: 48 g, Rfl: 0 .  Liraglutide -Weight Management (SAXENDA) 18 MG/3ML SOPN, Inject 0.6-3 mg into the skin daily. Start at 0.6 mg and go up by 0.6 weekly to max of 3 mg daily, Disp: 15 mL, Rfl: 0 .  Olmesartan-amLODIPine-HCTZ 40-10-25 MG TABS, Take 1 tablet by mouth daily., Disp: 90 tablet, Rfl: 1 .  omeprazole (PRILOSEC) 40 MG capsule, Take 1 capsule (40 mg total) by mouth daily., Disp: 30 capsule, Rfl: 0 .  Insulin Pen Needle (NOVOFINE) 32G X 6 MM MISC, 1 each by Does not apply route daily. (Patient not taking: Reported on 04/10/2020), Disp: 100 each, Rfl: 2  Allergies  Allergen Reactions  . Penicillins Itching    Has patient had a PCN reaction causing immediate rash, facial/tongue/throat swelling, SOB or lightheadedness with hypotension: No Has patient had a PCN reaction causing severe rash involving mucus membranes or skin necrosis: No Has patient had a PCN reaction that required hospitalization: No Has patient had a PCN reaction occurring within the last 10 years: No If all of the above answers are "NO", then may proceed with Cephalosporin use.     I personally reviewed active problem list, medication list, allergies, family history, social history, health maintenance with  the patient/caregiver today.   ROS  Constitutional: Negative for fever , positive for  weight change.  Respiratory: Negative for cough and shortness of breath.   Cardiovascular: Negative for chest pain or palpitations.  Gastrointestinal: Negative for abdominal pain, no bowel changes.  Musculoskeletal: Negative for gait problem or joint swelling.  Skin: Negative for rash.  Neurological: Negative for dizziness or headache.  No other specific complaints in a complete review of systems (except as listed in HPI above).  Objective  Vitals:   04/10/20 1518  BP: 124/70  Pulse: 93  Resp: 16  Temp: 98.1 F (36.7 C)  TempSrc: Oral  SpO2: 100%  Weight: 173 lb 6.4 oz (78.7 kg)  Height: 5\' 2"  (1.575 m)  Body mass index is 31.72 kg/m.  Physical Exam  Constitutional: Patient appears well-developed and well-nourished. Obese  No distress.  HEENT: head atraumatic, normocephalic, pupils equal and reactive to light, neck supple Cardiovascular: Normal rate, regular rhythm and normal heart sounds.  No murmur heard. No BLE edema. Pulmonary/Chest: Effort normal and breath sounds normal. No respiratory distress. Abdominal: Soft.  There is no tenderness. Psychiatric: Patient has a normal mood and affect. behavior is normal. Judgment and thought content normal. Muscular skeletal: normal back, hip and knee exam, slow gait, stiff secondary to pain   PHQ2/9: Depression screen [facility] 2/9 04/10/2020 01/09/2020 10/27/2019 08/01/2019 06/14/2019  Decreased Interest 0 0 0 0 0  Down, Depressed, Hopeless 0 0 0 0 0  PHQ - 2 Score 0 0 0 0 0  Altered sleeping - 0 0 0 0  Tired, decreased energy - 0 0 0 0  Change in appetite - 0 0 0 0  Feeling bad or failure about yourself  - 0 0 0 0  Trouble concentrating - 0 0 0 0  Moving slowly or fidgety/restless - 0 0 0 0  Suicidal thoughts - 0 0 0 0  PHQ-9 Score - 0 0 0 0  Difficult doing work/chores - - - - -    phq 9 is negative   Fall Risk: Fall Risk  04/10/2020  01/09/2020 10/27/2019 08/01/2019 06/14/2019  Falls in the past year? 0 0 0 0 0  Number falls in past yr: 0 0 0 0 0  Injury with Fall? 0 - 0 0 0     Assessment & Plan  1. Benign essential hypertension  - Olmesartan-amLODIPine-HCTZ 40-10-25 MG TABS; Take 1 tablet by mouth daily.  Dispense: 90 tablet; Refill: 1 - CBC with Differential/Platelet - Comprehensive metabolic panel  2. Need for immunization against influenza  - Flu Vaccine QUAD 36+ mos IM  3. GAD (generalized anxiety disorder)  - FLUoxetine (PROZAC) 10 MG tablet; Take 1 tablet (10 mg total) by mouth daily.  Dispense: 90 tablet; Refill: 0  4. Need for hepatitis C screening test  - Hepatitis C Antibody  5. Gastroesophageal reflux disease without esophagitis   6. Pure hypercholesterolemia  - Lipid panel  7. LVH (left ventricular hypertrophy) due to hypertensive disease, without heart failure   8. Pre-diabetes   9. OSA (obstructive sleep apnea)  We will try to figure out what is going on with her CPAP machine   10. Hyperglycemia  - Hemoglobin A1c  11. Obesity (BMI 30.0-34.9)  - Liraglutide -Weight Management (SAXENDA) 18 MG/3ML SOPN; Inject 0.6-3 mg into the skin daily. Start at 0.6 mg and go up by 0.6 weekly to max of 3 mg daily  Dispense: 15 mL; Refill: 0  12. Environmental and seasonal allergies  - fluticasone (FLONASE) 50 MCG/ACT nasal spray; Place 2 sprays into both nostrils daily.  Dispense: 48 g; Refill: 0  13. Microalbuminuria  - Microalbumin / creatinine urine ratio  14. Pain in both thighs  - Ambulatory referral to Orthopedic Surgery  15. Joint stiffness  - Ambulatory referral to Orthopedic Surgery - C-reactive protein - Sedimentation rate.

## 2020-04-10 ENCOUNTER — Ambulatory Visit (INDEPENDENT_AMBULATORY_CARE_PROVIDER_SITE_OTHER): Payer: Managed Care, Other (non HMO) | Admitting: Family Medicine

## 2020-04-10 ENCOUNTER — Encounter: Payer: Self-pay | Admitting: Family Medicine

## 2020-04-10 ENCOUNTER — Other Ambulatory Visit: Payer: Self-pay

## 2020-04-10 VITALS — BP 124/70 | HR 93 | Temp 98.1°F | Resp 16 | Ht 62.0 in | Wt 173.4 lb

## 2020-04-10 DIAGNOSIS — F411 Generalized anxiety disorder: Secondary | ICD-10-CM | POA: Diagnosis not present

## 2020-04-10 DIAGNOSIS — Z23 Encounter for immunization: Secondary | ICD-10-CM

## 2020-04-10 DIAGNOSIS — M79651 Pain in right thigh: Secondary | ICD-10-CM

## 2020-04-10 DIAGNOSIS — E669 Obesity, unspecified: Secondary | ICD-10-CM

## 2020-04-10 DIAGNOSIS — Z1159 Encounter for screening for other viral diseases: Secondary | ICD-10-CM | POA: Diagnosis not present

## 2020-04-10 DIAGNOSIS — R7303 Prediabetes: Secondary | ICD-10-CM

## 2020-04-10 DIAGNOSIS — M2560 Stiffness of unspecified joint, not elsewhere classified: Secondary | ICD-10-CM

## 2020-04-10 DIAGNOSIS — I119 Hypertensive heart disease without heart failure: Secondary | ICD-10-CM

## 2020-04-10 DIAGNOSIS — E66811 Obesity, class 1: Secondary | ICD-10-CM

## 2020-04-10 DIAGNOSIS — I10 Essential (primary) hypertension: Secondary | ICD-10-CM | POA: Diagnosis not present

## 2020-04-10 DIAGNOSIS — R809 Proteinuria, unspecified: Secondary | ICD-10-CM

## 2020-04-10 DIAGNOSIS — K219 Gastro-esophageal reflux disease without esophagitis: Secondary | ICD-10-CM

## 2020-04-10 DIAGNOSIS — M79652 Pain in left thigh: Secondary | ICD-10-CM

## 2020-04-10 DIAGNOSIS — J3089 Other allergic rhinitis: Secondary | ICD-10-CM

## 2020-04-10 DIAGNOSIS — E7800 Pure hypercholesterolemia, unspecified: Secondary | ICD-10-CM

## 2020-04-10 DIAGNOSIS — R739 Hyperglycemia, unspecified: Secondary | ICD-10-CM

## 2020-04-10 DIAGNOSIS — G4733 Obstructive sleep apnea (adult) (pediatric): Secondary | ICD-10-CM

## 2020-04-10 MED ORDER — OLMESARTAN-AMLODIPINE-HCTZ 40-10-25 MG PO TABS: 1.0000 | ORAL_TABLET | Freq: Every day | ORAL | 1 refills | Status: DC

## 2020-04-10 MED ORDER — SAXENDA 18 MG/3ML ~~LOC~~ SOPN: 0.6000 mg | PEN_INJECTOR | Freq: Every day | SUBCUTANEOUS | 0 refills | Status: DC

## 2020-04-10 MED ORDER — FLUOXETINE HCL 10 MG PO TABS: 10.0000 mg | ORAL_TABLET | Freq: Every day | ORAL | 0 refills | Status: DC

## 2020-04-10 MED ORDER — FLUTICASONE PROPIONATE 50 MCG/ACT NA SUSP: 2.0000 | Freq: Every day | NASAL | 0 refills | Status: DC

## 2020-04-11 ENCOUNTER — Encounter: Payer: Self-pay | Admitting: Family Medicine

## 2020-04-11 LAB — CBC WITH DIFFERENTIAL/PLATELET
Basophils Absolute: 0 10*3/uL (ref 0.0–0.2)
Basos: 0 %
EOS (ABSOLUTE): 0.1 10*3/uL (ref 0.0–0.4)
Eos: 1 %
Hematocrit: 42.7 % (ref 34.0–46.6)
Hemoglobin: 14 g/dL (ref 11.1–15.9)
Immature Grans (Abs): 0 10*3/uL (ref 0.0–0.1)
Immature Granulocytes: 0 %
Lymphocytes Absolute: 3.7 10*3/uL — ABNORMAL HIGH (ref 0.7–3.1)
Lymphs: 48 %
MCH: 27 pg (ref 26.6–33.0)
MCHC: 32.8 g/dL (ref 31.5–35.7)
MCV: 82 fL (ref 79–97)
Monocytes Absolute: 0.5 10*3/uL (ref 0.1–0.9)
Monocytes: 6 %
Neutrophils Absolute: 3.5 10*3/uL (ref 1.4–7.0)
Neutrophils: 45 %
Platelets: 325 10*3/uL (ref 150–450)
RBC: 5.18 x10E6/uL (ref 3.77–5.28)
RDW: 13.4 % (ref 11.7–15.4)
WBC: 7.7 10*3/uL (ref 3.4–10.8)

## 2020-04-11 LAB — HEMOGLOBIN A1C
Est. average glucose Bld gHb Est-mCnc: 134 mg/dL
Hgb A1c MFr Bld: 6.3 % — ABNORMAL HIGH (ref 4.8–5.6)

## 2020-04-11 LAB — COMPREHENSIVE METABOLIC PANEL
ALT: 39 IU/L — ABNORMAL HIGH (ref 0–32)
AST: 25 IU/L (ref 0–40)
Albumin/Globulin Ratio: 1.9 (ref 1.2–2.2)
Albumin: 5.1 g/dL — ABNORMAL HIGH (ref 3.8–4.9)
Alkaline Phosphatase: 69 IU/L (ref 44–121)
BUN/Creatinine Ratio: 20 (ref 9–23)
BUN: 17 mg/dL (ref 6–24)
Bilirubin Total: 0.3 mg/dL (ref 0.0–1.2)
CO2: 24 mmol/L (ref 20–29)
Calcium: 10.5 mg/dL — ABNORMAL HIGH (ref 8.7–10.2)
Chloride: 101 mmol/L (ref 96–106)
Creatinine, Ser: 0.84 mg/dL (ref 0.57–1.00)
GFR calc Af Amer: 92 mL/min/{1.73_m2} (ref >59)
GFR calc non Af Amer: 80 mL/min/{1.73_m2} (ref >59)
Globulin, Total: 2.7 g/dL (ref 1.5–4.5)
Glucose: 92 mg/dL (ref 65–99)
Potassium: 4.1 mmol/L (ref 3.5–5.2)
Sodium: 144 mmol/L (ref 134–144)
Total Protein: 7.8 g/dL (ref 6.0–8.5)

## 2020-04-11 LAB — MICROALBUMIN / CREATININE URINE RATIO
Creatinine, Urine: 124.9 mg/dL
Microalb/Creat Ratio: 7 mg/g creat (ref 0–29)
Microalbumin, Urine: 8.6 ug/mL

## 2020-04-11 LAB — SEDIMENTATION RATE: Sed Rate: 44 mm/hr — ABNORMAL HIGH (ref 0–40)

## 2020-04-11 LAB — C-REACTIVE PROTEIN: CRP: 2 mg/L (ref 0–10)

## 2020-04-11 LAB — HEPATITIS C ANTIBODY: Hep C Virus Ab: <0.1 s/co ratio (ref 0.0–0.9)

## 2020-04-11 LAB — LIPID PANEL
Chol/HDL Ratio: 4.2 ratio (ref 0.0–4.4)
Cholesterol, Total: 211 mg/dL — ABNORMAL HIGH (ref 100–199)
HDL: 50 mg/dL (ref >39)
LDL Chol Calc (NIH): 129 mg/dL — ABNORMAL HIGH (ref 0–99)
Triglycerides: 183 mg/dL — ABNORMAL HIGH (ref 0–149)
VLDL Cholesterol Cal: 32 mg/dL (ref 5–40)

## 2020-05-01 ENCOUNTER — Other Ambulatory Visit: Payer: Self-pay | Admitting: Family Medicine

## 2020-05-01 DIAGNOSIS — I10 Essential (primary) hypertension: Secondary | ICD-10-CM

## 2020-05-14 ENCOUNTER — Encounter: Payer: Self-pay | Admitting: Family Medicine

## 2020-05-14 ENCOUNTER — Telehealth: Payer: Self-pay

## 2020-05-14 NOTE — Telephone Encounter (Signed)
Copied from CRM [id number]. Topic: General - Other >> May 14, 2020 10:16 AM [name] wrote: Reason for CRM: Pt called and is requesting to have her paperwork that she dropped off on 05/11/20 by 05/18/20. Please advise.

## 2020-05-31 ENCOUNTER — Encounter: Payer: Self-pay | Admitting: Family Medicine

## 2020-06-01 ENCOUNTER — Other Ambulatory Visit: Payer: Self-pay | Admitting: Family Medicine

## 2020-06-01 DIAGNOSIS — E7800 Pure hypercholesterolemia, unspecified: Secondary | ICD-10-CM

## 2020-06-01 MED ORDER — ROSUVASTATIN CALCIUM 10 MG PO TABS: 10.0000 mg | ORAL_TABLET | Freq: Every day | ORAL | 0 refills | Status: DC

## 2020-06-04 ENCOUNTER — Other Ambulatory Visit: Payer: Self-pay | Admitting: Family Medicine

## 2020-06-04 DIAGNOSIS — E669 Obesity, unspecified: Secondary | ICD-10-CM

## 2020-06-07 ENCOUNTER — Encounter: Payer: Self-pay | Admitting: Family Medicine

## 2020-06-11 NOTE — Progress Notes (Signed)
Name: [name]   MRN: [id number]    DOB: 1965/11/23   Date:06/12/2020       Progress Note  Subjective  Chief Complaint  Pain in right thigh  HPI  Right thigh pain: she states symptoms started months ago , we sent her to [facility] and was advised to get PT and was given a muscle relaxer. She states symptoms are getting worse, the pain is described as  intense cramping on right anterior thigh, it is intermittent but intense when it happens, improves when she eats mustard and sometimes massage. She states worse when she first stands up and causes her to limp at times. Episodes lasts about 10 minutes. She states she stopped Crestor because she thought it was causing the pain, explained myalgia from crestor is different and to resume medication.    Patient Active Problem List   Diagnosis Date Noted   OSA (obstructive sleep apnea) 09/08/2019   Ganglion cyst of dorsum of left wrist 02/05/2018   LVH (left ventricular hypertrophy) due to hypertensive disease, without heart failure 09/23/2017   SOBOE (shortness of breath on exertion) 09/23/2017   Hyperlipidemia, mixed 08/28/2017   Hyperglycemia 08/25/2017   Hypercalcemia due to thiazide and vitamin A 08/25/2017   Hypertension 08/18/2017   GAD (generalized anxiety disorder) 08/18/2017   GERD (gastroesophageal reflux disease) 08/18/2017   Panic attack 08/18/2017   Tobacco use 96/28/3662   Systolic ejection murmur 94/76/5465    Past Surgical History:  Procedure Laterality Date   ABDOMINAL HYSTERECTOMY     Complete Hysterectomy   CHOLECYSTECTOMY     GANGLION CYST EXCISION Left 03/01/2018   Procedure: REMOVAL GANGLION OF WRIST;  Surgeon: Earnestine Leys, MD;  Location: [facility] ORS;  Service: Orthopedics;  Laterality: Left;    Family History  Problem Relation Age of Onset   Hypertension Mother    Stroke Father    Hypertension Father    Hypertension Sister    Breast cancer Neg Hx     Social History   Tobacco  Use   Smoking status: Current Every Day Smoker    Packs/day: 0.50    Years: 18.00    Pack years: 9.00    Types: Cigarettes    Start date: 08/19/1999   Smokeless tobacco: Never Used  Substance Use Topics   Alcohol use: Yes    Alcohol/week: 1.0 standard drink    Types: 1 Glasses of wine per week     Current Outpatient Medications:    aspirin EC 81 MG tablet, Take 162 mg by mouth daily as needed for moderate pain. , Disp: , Rfl:    FLUoxetine (PROZAC) 10 MG tablet, Take 1 tablet (10 mg total) by mouth daily., Disp: 90 tablet, Rfl: 0   fluticasone (FLONASE) 50 MCG/ACT nasal spray, Place 2 sprays into both nostrils daily., Disp: 48 g, Rfl: 0   Liraglutide -Weight Management (SAXENDA) 18 MG/3ML SOPN, Inject 3 mg into the skin daily., Disp: 9 mL, Rfl: 0   Olmesartan-amLODIPine-HCTZ 40-10-25 MG TABS, Take 1 tablet by mouth daily., Disp: 90 tablet, Rfl: 1   omeprazole (PRILOSEC) 40 MG capsule, Take 1 capsule (40 mg total) by mouth daily., Disp: 30 capsule, Rfl: 0   Insulin Pen Needle (NOVOFINE) 32G X 6 MM MISC, 1 each by Does not apply route daily. (Patient not taking: Reported on 04/10/2020), Disp: 100 each, Rfl: 2   predniSONE (DELTASONE) 10 MG tablet, Take 1 tablet (10 mg total) by mouth 2 (two) times daily with a meal. Take  6, 6, 5,5 4 4,,3,3,2,2,1and 1 pills divided for twice daily with food, Disp: 42 tablet, Rfl: 0   rosuvastatin (CRESTOR) 10 MG tablet, Take 1 tablet (10 mg total) by mouth daily. In place of Atorvastatin (Patient not taking: Reported on 06/12/2020), Disp: 90 tablet, Rfl: 0  Allergies  Allergen Reactions   Penicillins Itching    Has patient had a PCN reaction causing immediate rash, facial/tongue/throat swelling, SOB or lightheadedness with hypotension: No Has patient had a PCN reaction causing severe rash involving mucus membranes or skin necrosis: No Has patient had a PCN reaction that required hospitalization: No Has patient had a PCN reaction occurring  within the last 10 years: No If all of the above answers are "NO", then may proceed with Cephalosporin use.     I personally reviewed active problem list, medication list, allergies, family history, social history, health maintenance with the patient/caregiver today.   ROS  Ten systems reviewed and is negative except as mentioned in HPI   Objective  Vitals:   06/12/20 1421  BP: 110/72  Pulse: 98  Resp: 18  Temp: 98.1 F (36.7 C)  TempSrc: Oral  SpO2: 100%  Weight: 172 lb 4.8 oz (78.2 kg)  Height: '5\' 2"'  (1.575 m)    Body mass index is 31.51 kg/m.  Physical Exam  Constitutional: Patient appears well-developed and well-nourished. Obese  No distress.  HEENT: head atraumatic, normocephalic, pupils equal and reactive to light,  neck supple Cardiovascular: Normal rate, regular rhythm and normal heart sounds.  No murmur heard. No BLE edema. Pulmonary/Chest: Effort normal and breath sounds normal. No respiratory distress. Abdominal: Soft.  There is no tenderness. Muscular Skeletal: pain during palpation of right lumbar spine, pain on right quad with extension of right lower leg, hyperreflexia on right patella tendon only pain on right quad when extends spine, normal flexion, but has pain with lateral bending , normal rom of hip and knee  Psychiatric: Patient has a normal mood and affect. behavior is normal. Judgment and thought content normal.  Recent Results (from the past 2160 hour(s))  CBC with Differential/Platelet     Status: Abnormal   Collection Time: 04/10/20  4:41 PM  Result Value Ref Range   WBC 7.7 3.4 - 10.8 x10E3/uL   RBC 5.18 3.77 - 5.28 x10E6/uL   Hemoglobin 14.0 11.1 - 15.9 g/dL   Hematocrit 42.7 34.0 - 46.6 %   MCV 82 79 - 97 fL   MCH 27.0 26.6 - 33.0 pg   MCHC 32.8 31 - 35 g/dL   RDW 13.4 11.7 - 15.4 %   Platelets 325 150 - 450 x10E3/uL   Neutrophils 45 Not Estab. %   Lymphs 48 Not Estab. %   Monocytes 6 Not Estab. %   Eos 1 Not Estab. %   Basos 0 Not  Estab. %   Neutrophils Absolute 3.5 1.40 - 7.00 x10E3/uL   Lymphocytes Absolute 3.7 (H) 0 - 3 x10E3/uL   Monocytes Absolute 0.5 0 - 0 x10E3/uL   EOS (ABSOLUTE) 0.1 0.0 - 0.4 x10E3/uL   Basophils Absolute 0.0 0 - 0 x10E3/uL   Immature Granulocytes 0 Not Estab. %   Immature Grans (Abs) 0.0 0.0 - 0.1 x10E3/uL  Comprehensive metabolic panel     Status: Abnormal   Collection Time: 04/10/20  4:41 PM  Result Value Ref Range   Glucose 92 65 - 99 mg/dL   BUN 17 6 - 24 mg/dL   Creatinine, Ser 0.84 0.57 - 1.00 mg/dL  GFR calc non Af Amer 80 >59 mL/min/1.73   GFR calc Af Amer 92 >59 mL/min/1.73    Comment: **[facility] currently reports eGFR in compliance with the current**   recommendations of the [employer]. [facility] will   update reporting as new guidelines are published from the NKF-ASN   Task force.    BUN/Creatinine Ratio 20 9 - 23   Sodium 144 134 - 144 mmol/L   Potassium 4.1 3.5 - 5.2 mmol/L   Chloride 101 96 - 106 mmol/L   CO2 24 20 - 29 mmol/L   Calcium 10.5 (H) 8.7 - 10.2 mg/dL   Total Protein 7.8 6.0 - 8.5 g/dL   Albumin 5.1 (H) 3.8 - 4.9 g/dL   Globulin, Total 2.7 1.5 - 4.5 g/dL   Albumin/Globulin Ratio 1.9 1.2 - 2.2   Bilirubin Total 0.3 0.0 - 1.2 mg/dL   Alkaline Phosphatase 69 44 - 121 IU/L    Comment:               **Please note reference interval change**   AST 25 0 - 40 IU/L   ALT 39 (H) 0 - 32 IU/L  C-reactive protein     Status: None   Collection Time: 04/10/20  4:41 PM  Result Value Ref Range   CRP 2 0 - 10 mg/L  Hemoglobin A1c     Status: Abnormal   Collection Time: 04/10/20  4:41 PM  Result Value Ref Range   Hgb A1c MFr Bld 6.3 (H) 4.8 - 5.6 %    Comment:          Prediabetes: 5.7 - 6.4          Diabetes: >6.4          Glycemic control for adults with diabetes: <7.0    Est. average glucose Bld gHb Est-mCnc 134 mg/dL  Hepatitis C Antibody     Status: None   Collection Time: 04/10/20  4:41 PM  Result Value Ref Range   Hep C Virus Ab <0.1 0.0  - 0.9 s/co ratio    Comment:                                   Negative:     < 0.8                              Indeterminate: 0.8 - 0.9                                   Positive:     > 0.9  The CDC recommends that a positive HCV antibody result  be followed up with a HCV Nucleic Acid Amplification  test (169450).   Microalbumin / creatinine urine ratio     Status: None   Collection Time: 04/10/20  4:41 PM  Result Value Ref Range   Creatinine, Urine 124.9 Not Estab. mg/dL   Microalbumin, Urine 8.6 Not Estab. ug/mL   Microalb/Creat Ratio 7 0 - 29 mg/g creat    Comment:                        Normal:                0 -  29  Moderately increased: 30 - 300                        Severely increased:       >300   Sedimentation rate     Status: Abnormal   Collection Time: 04/10/20  4:41 PM  Result Value Ref Range   Sed Rate 44 (H) 0 - 40 mm/hr  Lipid panel     Status: Abnormal   Collection Time: 04/10/20  4:41 PM  Result Value Ref Range   Cholesterol, Total 211 (H) 100 - 199 mg/dL   Triglycerides 183 (H) 0 - 149 mg/dL   HDL 50 >39 mg/dL   VLDL Cholesterol Cal 32 5 - 40 mg/dL   LDL Chol Calc (NIH) 129 (H) 0 - 99 mg/dL   Chol/HDL Ratio 4.2 0.0 - 4.4 ratio    Comment:                                   T. Chol/HDL Ratio                                             Men  Women                               1/2 Avg.Risk  3.4    3.3                                   Avg.Risk  5.0    4.4                                2X Avg.Risk  9.6    7.1                                3X Avg.Risk 23.4   11.0       PHQ2/9: Depression screen [facility] 2/9 06/12/2020 04/10/2020 01/09/2020 10/27/2019 08/01/2019  Decreased Interest 0 0 0 0 0  Down, Depressed, Hopeless 0 0 0 0 0  PHQ - 2 Score 0 0 0 0 0  Altered sleeping - - 0 0 0  Tired, decreased energy - - 0 0 0  Change in appetite - - 0 0 0  Feeling bad or failure about yourself  - - 0 0 0  Trouble concentrating - - 0 0 0  Moving  slowly or fidgety/restless - - 0 0 0  Suicidal thoughts - - 0 0 0  PHQ-9 Score - - 0 0 0  Difficult doing work/chores - - - - -    phq 9 is negative   Fall Risk: Fall Risk  06/12/2020 04/10/2020 01/09/2020 10/27/2019 08/01/2019  Falls in the past year? 1 0 0 0 0  Number falls in past yr: 0 0 0 0 0  Injury with Fall? 0 0 - 0 0     Functional Status Survey: Is the patient deaf or have difficulty hearing?: No Does the patient have difficulty seeing, even when wearing glasses/contacts?: No Does the patient have difficulty concentrating,  remembering, or making decisions?: No Does the patient have difficulty walking or climbing stairs?: Yes Does the patient have difficulty dressing or bathing?: No Does the patient have difficulty doing errands alone such as visiting a doctor's office or shopping?: No   Assessment & Plan  1. Right thigh pain  Likely from radiculitis   2. Lumbar radiculitis  If symptoms do not improve we will order MRI lumbar spine and or NCS. Discussed importance of taking medication with food and as prescribed  - predniSONE (DELTASONE) 10 MG tablet; Take 1 tablet (10 mg total) by mouth 2 (two) times daily with a meal. Take 6, 6, 5,5 4 4,,3,3,2,2,1and 1 pills divided for twice daily with food  Dispense: 42 tablet; Refill: 0

## 2020-06-12 ENCOUNTER — Ambulatory Visit (INDEPENDENT_AMBULATORY_CARE_PROVIDER_SITE_OTHER): Payer: Managed Care, Other (non HMO) | Admitting: Family Medicine

## 2020-06-12 ENCOUNTER — Encounter: Payer: Self-pay | Admitting: Family Medicine

## 2020-06-12 ENCOUNTER — Other Ambulatory Visit: Payer: Self-pay

## 2020-06-12 VITALS — BP 110/72 | HR 98 | Temp 98.1°F | Resp 18 | Ht 62.0 in | Wt 172.3 lb

## 2020-06-12 DIAGNOSIS — M79651 Pain in right thigh: Secondary | ICD-10-CM

## 2020-06-12 DIAGNOSIS — M5416 Radiculopathy, lumbar region: Secondary | ICD-10-CM | POA: Diagnosis not present

## 2020-06-12 MED ORDER — PREDNISONE 10 MG PO TABS: 10.0000 mg | ORAL_TABLET | Freq: Two times a day (BID) | ORAL | 0 refills | Status: DC

## 2020-07-05 NOTE — Progress Notes (Signed)
Name: [name]   MRN: [id number]    DOB: 08/30/1965   Date:07/06/2020       Progress Note  Subjective  Chief Complaint  Follow up  HPI   Right thigh pain: she states symptoms started months ago , we sent her to [facility] and was advised to get PT and was given a muscle relaxer. She states symptoms are getting worse, the pain is described as  intense cramping on right anterior thigh, it is intermittent but intense when it happens, improves when she eats mustard and sometimes massage. She states worse when she first stands up and causes her to limp at times. She went to Ortho, x-ray hip showed OA and diagnosed with lumbar radiculitis, seen by me one month ago and states symptoms improved while on prednisone since she finished she is getting worse again. Having outer hip pain, limping, sometimes anterior quad and now also on right groin with internal rotation of hip, difficulty lifting and moving her leg during the night.   Obesity: BMI above 30 but right under 35. She was going to the weight loss clinic, she states phentermine worked well for her, but since she has been off medication bp is at goal. She states she stopped going to the weight loss clinic because of cost. She became obese in her early 20's and she has tried multiple diets. Such as Weight Watchers, cabbage diet, Atkins , she loses weight on her diet but gains it back. . She tried Ozempic but stopped on her own , she came in in June and stated a friend was taking [place] and we started her on medication, she is on 3 mg, she is tolerating medication well, she lost 16 lbs and states it has curbed her appetite but she has not been very active due to right leg pain, also took prednisone recently. Continue life style modification   HTN:bp is now towards low end of normal, we will adjust dose of Tribenzor from 40/10/25 to 40/5/25   Anxiety: she continues to feel anxious. Symptoms started when her son went to prison around 2014 for  selling drugs, he is out of jail, but he was in a fight and got arrested and they bailed him out, waiting for court date. She has been taking Paxil at night and is affecting her sleep, advised to take it the mornings   GERD: she states only flaring with dietary indiscretion.She is off Pepcid and has been taking Omeprazole prn. Advised her last time to try tums at night when she has chest pain but she has not tried yet, it may also be due to anxiety   Pre-diabetes: she denies polyphagia, polyuria or polydipsia. Losing weight, happy with it, we will recheck labs Continue Saxenda  OSA: also has LVH, uncontrolled hypertension, interrupted sleep- She went to pulmonologist - Dr. Halford Chessman , has moderate OSA, but never got the supplies We will see if she needs a titration study   Patient Active Problem List   Diagnosis Date Noted  . OSA (obstructive sleep apnea) 09/08/2019  . Ganglion cyst of dorsum of left wrist 02/05/2018  . LVH (left ventricular hypertrophy) due to hypertensive disease, without heart failure 09/23/2017  . SOBOE (shortness of breath on exertion) 09/23/2017  . Hyperlipidemia, mixed 08/28/2017  . Hyperglycemia 08/25/2017  . Hypercalcemia due to thiazide and vitamin A 08/25/2017  . Hypertension 08/18/2017  . GAD (generalized anxiety disorder) 08/18/2017  . GERD (gastroesophageal reflux disease) 08/18/2017  . Panic attack 08/18/2017  .  Tobacco use 08/18/2017  . Systolic ejection murmur 16/04/9603    Past Surgical History:  Procedure Laterality Date  . ABDOMINAL HYSTERECTOMY     Complete Hysterectomy  . CHOLECYSTECTOMY    . GANGLION CYST EXCISION Left 03/01/2018   Procedure: REMOVAL GANGLION OF WRIST;  Surgeon: Earnestine Leys, MD;  Location: [facility] ORS;  Service: Orthopedics;  Laterality: Left;    Family History  Problem Relation Age of Onset  . Hypertension Mother   . Stroke Father   . Hypertension Father   . Hypertension Sister   . Breast cancer Neg Hx     Social  History   Tobacco Use  . Smoking status: Current Every Day Smoker    Packs/day: 0.50    Years: 18.00    Pack years: 9.00    Types: Cigarettes    Start date: 08/19/1999  . Smokeless tobacco: Never Used  Substance Use Topics  . Alcohol use: Yes    Alcohol/week: 1.0 standard drink    Types: 1 Glasses of wine per week     Current Outpatient Medications:  .  aspirin EC 81 MG tablet, Take 162 mg by mouth daily as needed for moderate pain. , Disp: , Rfl:  .  fluticasone (FLONASE) 50 MCG/ACT nasal spray, Place 2 sprays into both nostrils daily., Disp: 48 g, Rfl: 0 .  Liraglutide -Weight Management (SAXENDA) 18 MG/3ML SOPN, Inject 3 mg into the skin daily., Disp: 9 mL, Rfl: 0 .  omeprazole (PRILOSEC) 40 MG capsule, Take 1 capsule (40 mg total) by mouth daily., Disp: 30 capsule, Rfl: 0 .  rosuvastatin (CRESTOR) 10 MG tablet, Take 1 tablet (10 mg total) by mouth daily. In place of Atorvastatin, Disp: 90 tablet, Rfl: 0 .  FLUoxetine (PROZAC) 10 MG tablet, Take 1 tablet (10 mg total) by mouth daily., Disp: 90 tablet, Rfl: 0 .  Insulin Pen Needle (NOVOFINE) 32G X 6 MM MISC, 1 each by Does not apply route daily. (Patient not taking: No sig reported), Disp: 100 each, Rfl: 2 .  Olmesartan-amLODIPine-HCTZ 40-5-25 MG TABS, Take 1 tablet by mouth daily., Disp: 90 tablet, Rfl: 0  Allergies  Allergen Reactions  . Penicillins Itching    Has patient had a PCN reaction causing immediate rash, facial/tongue/throat swelling, SOB or lightheadedness with hypotension: No Has patient had a PCN reaction causing severe rash involving mucus membranes or skin necrosis: No Has patient had a PCN reaction that required hospitalization: No Has patient had a PCN reaction occurring within the last 10 years: No If all of the above answers are "NO", then may proceed with Cephalosporin use.     I personally reviewed active problem list, medication list, allergies, family history, social history, health maintenance with the  patient/caregiver today.   ROS  Constitutional: Negative for fever or weight change.  Respiratory: Negative for cough and shortness of breath.   Cardiovascular: Negative for chest pain or palpitations.  Gastrointestinal: Negative for abdominal pain, no bowel changes.  Musculoskeletal: Positive for gait problem but no joint swelling.  Skin: Negative for rash.  Neurological: Negative for dizziness or headache.  No other specific complaints in a complete review of systems (except as listed in HPI above).  Objective  Vitals:   07/06/20 1425  BP: 108/62  Pulse: 98  Resp: 18  Temp: 98.3 F (36.8 C)  TempSrc: Oral  SpO2: 100%  Weight: 176 lb 4.8 oz (80 kg)  Height: '5\' 2"'  (1.575 m)    Body mass index is 32.25 kg/m.  Physical Exam  Constitutional: Patient appears well-developed and well-nourished. Obese  No distress.  HEENT: head atraumatic, normocephalic, pupils equal and reactive to light, neck supple Cardiovascular: Normal rate, regular rhythm and normal heart sounds.  No murmur heard. No BLE edema. Pulmonary/Chest: Effort normal and breath sounds normal. No respiratory distress. Abdominal: Soft.  There is no tenderness. Muscular skeletal: no back pain, normal rom of spine, she has antalgic gait, pain with internal and external rotation of right hip, normal knee exam  Psychiatric: Patient has a normal mood and affect. behavior is normal. Judgment and thought content normal.  Recent Results (from the past 2160 hour(s))  CBC with Differential/Platelet     Status: Abnormal   Collection Time: 04/10/20  4:41 PM  Result Value Ref Range   WBC 7.7 3.4 - 10.8 x10E3/uL   RBC 5.18 3.77 - 5.28 x10E6/uL   Hemoglobin 14.0 11.1 - 15.9 g/dL   Hematocrit 42.7 34.0 - 46.6 %   MCV 82 79 - 97 fL   MCH 27.0 26.6 - 33.0 pg   MCHC 32.8 31.5 - 35.7 g/dL   RDW 13.4 11.7 - 15.4 %   Platelets 325 150 - 450 x10E3/uL   Neutrophils 45 Not Estab. %   Lymphs 48 Not Estab. %   Monocytes 6 Not  Estab. %   Eos 1 Not Estab. %   Basos 0 Not Estab. %   Neutrophils Absolute 3.5 1.4 - 7.0 x10E3/uL   Lymphocytes Absolute 3.7 (H) 0.7 - 3.1 x10E3/uL   Monocytes Absolute 0.5 0.1 - 0.9 x10E3/uL   EOS (ABSOLUTE) 0.1 0.0 - 0.4 x10E3/uL   Basophils Absolute 0.0 0.0 - 0.2 x10E3/uL   Immature Granulocytes 0 Not Estab. %   Immature Grans (Abs) 0.0 0.0 - 0.1 x10E3/uL  Comprehensive metabolic panel     Status: Abnormal   Collection Time: 04/10/20  4:41 PM  Result Value Ref Range   Glucose 92 65 - 99 mg/dL   BUN 17 6 - 24 mg/dL   Creatinine, Ser 0.84 0.57 - 1.00 mg/dL   GFR calc non Af Amer 80 >59 mL/min/1.73   GFR calc Af Amer 92 >59 mL/min/1.73    Comment: **[facility] currently reports eGFR in compliance with the current**   recommendations of the [employer]. [facility] will   update reporting as new guidelines are published from the NKF-ASN   Task force.    BUN/Creatinine Ratio 20 9 - 23   Sodium 144 134 - 144 mmol/L   Potassium 4.1 3.5 - 5.2 mmol/L   Chloride 101 96 - 106 mmol/L   CO2 24 20 - 29 mmol/L   Calcium 10.5 (H) 8.7 - 10.2 mg/dL   Total Protein 7.8 6.0 - 8.5 g/dL   Albumin 5.1 (H) 3.8 - 4.9 g/dL   Globulin, Total 2.7 1.5 - 4.5 g/dL   Albumin/Globulin Ratio 1.9 1.2 - 2.2   Bilirubin Total 0.3 0.0 - 1.2 mg/dL   Alkaline Phosphatase 69 44 - 121 IU/L    Comment:               **Please note reference interval change**   AST 25 0 - 40 IU/L   ALT 39 (H) 0 - 32 IU/L  C-reactive protein     Status: None   Collection Time: 04/10/20  4:41 PM  Result Value Ref Range   CRP 2 0 - 10 mg/L  Hemoglobin A1c     Status: Abnormal   Collection Time: 04/10/20  4:41 PM  Result Value Ref Range   Hgb A1c MFr Bld 6.3 (H) 4.8 - 5.6 %    Comment:          Prediabetes: 5.7 - 6.4          Diabetes: >6.4          Glycemic control for adults with diabetes: <7.0    Est. average glucose Bld gHb Est-mCnc 134 mg/dL  Hepatitis C Antibody     Status: None   Collection Time: 04/10/20  4:41  PM  Result Value Ref Range   Hep C Virus Ab <0.1 0.0 - 0.9 s/co ratio    Comment:                                   Negative:     < 0.8                              Indeterminate: 0.8 - 0.9                                   Positive:     > 0.9  The CDC recommends that a positive HCV antibody result  be followed up with a HCV Nucleic Acid Amplification  test (378588).   Microalbumin / creatinine urine ratio     Status: None   Collection Time: 04/10/20  4:41 PM  Result Value Ref Range   Creatinine, Urine 124.9 Not Estab. mg/dL   Microalbumin, Urine 8.6 Not Estab. ug/mL   Microalb/Creat Ratio 7 0 - 29 mg/g creat    Comment:                        Normal:                0 -  29                        Moderately increased: 30 - 300                        Severely increased:       >300   Sedimentation rate     Status: Abnormal   Collection Time: 04/10/20  4:41 PM  Result Value Ref Range   Sed Rate 44 (H) 0 - 40 mm/hr  Lipid panel     Status: Abnormal   Collection Time: 04/10/20  4:41 PM  Result Value Ref Range   Cholesterol, Total 211 (H) 100 - 199 mg/dL   Triglycerides 183 (H) 0 - 149 mg/dL   HDL 50 >39 mg/dL   VLDL Cholesterol Cal 32 5 - 40 mg/dL   LDL Chol Calc (NIH) 129 (H) 0 - 99 mg/dL   Chol/HDL Ratio 4.2 0.0 - 4.4 ratio    Comment:                                   T. Chol/HDL Ratio  Men  Women                               1/2 Avg.Risk  3.4    3.3                                   Avg.Risk  5.0    4.4                                2X Avg.Risk  9.6    7.1                                3X Avg.Risk 23.4   11.0       PHQ2/9: Depression screen [facility] 2/9 07/06/2020 06/12/2020 04/10/2020 01/09/2020 10/27/2019  Decreased Interest 0 0 0 0 0  Down, Depressed, Hopeless 0 0 0 0 0  PHQ - 2 Score 0 0 0 0 0  Altered sleeping - - - 0 0  Tired, decreased energy - - - 0 0  Change in appetite - - - 0 0  Feeling bad or failure about yourself  -  - - 0 0  Trouble concentrating - - - 0 0  Moving slowly or fidgety/restless - - - 0 0  Suicidal thoughts - - - 0 0  PHQ-9 Score - - - 0 0  Difficult doing work/chores - - - - -  Some recent data might be hidden    phq 9 is negative   Fall Risk: Fall Risk  07/06/2020 06/12/2020 04/10/2020 01/09/2020 10/27/2019  Falls in the past year? 0 1 0 0 0  Number falls in past yr: 0 0 0 0 0  Injury with Fall? 0 0 0 - 0     Functional Status Survey: Is the patient deaf or have difficulty hearing?: No Does the patient have difficulty seeing, even when wearing glasses/contacts?: Yes Does the patient have difficulty concentrating, remembering, or making decisions?: No Does the patient have difficulty walking or climbing stairs?: Yes Does the patient have difficulty dressing or bathing?: No Does the patient have difficulty doing errands alone such as visiting a doctor's office or shopping?: No   Assessment & Plan  1. Right thigh pain  Likely from OA hip , rule avascular necrosis   2. Primary osteoarthritis of right hip  - MR HIP RIGHT W CONTRAST; Future  3. Lumbar radiculitis   4. GAD (generalized anxiety disorder)  - FLUoxetine (PROZAC) 10 MG tablet; Take 1 tablet (10 mg total) by mouth daily.  Dispense: 90 tablet; Refill: 0  5. Benign essential hypertension  - Olmesartan-amLODIPine-HCTZ 40-5-25 MG TABS; Take 1 tablet by mouth daily.  Dispense: 90 tablet; Refill: 0

## 2020-07-06 ENCOUNTER — Other Ambulatory Visit: Payer: Self-pay

## 2020-07-06 ENCOUNTER — Encounter: Payer: Self-pay | Admitting: Family Medicine

## 2020-07-06 ENCOUNTER — Ambulatory Visit (INDEPENDENT_AMBULATORY_CARE_PROVIDER_SITE_OTHER): Payer: Managed Care, Other (non HMO) | Admitting: Family Medicine

## 2020-07-06 DIAGNOSIS — F411 Generalized anxiety disorder: Secondary | ICD-10-CM

## 2020-07-06 DIAGNOSIS — M79651 Pain in right thigh: Secondary | ICD-10-CM | POA: Diagnosis not present

## 2020-07-06 DIAGNOSIS — M5416 Radiculopathy, lumbar region: Secondary | ICD-10-CM | POA: Diagnosis not present

## 2020-07-06 DIAGNOSIS — I10 Essential (primary) hypertension: Secondary | ICD-10-CM

## 2020-07-06 DIAGNOSIS — M1611 Unilateral primary osteoarthritis, right hip: Secondary | ICD-10-CM | POA: Diagnosis not present

## 2020-07-06 MED ORDER — OLMESARTAN-AMLODIPINE-HCTZ 40-5-25 MG PO TABS: 1.0000 | ORAL_TABLET | Freq: Every day | ORAL | 0 refills | Status: DC

## 2020-07-06 MED ORDER — FLUOXETINE HCL 10 MG PO TABS: 10.0000 mg | ORAL_TABLET | Freq: Every day | ORAL | 0 refills | Status: DC

## 2020-07-10 ENCOUNTER — Encounter: Payer: Self-pay | Admitting: Family Medicine

## 2020-07-10 ENCOUNTER — Other Ambulatory Visit: Payer: Self-pay

## 2020-07-10 ENCOUNTER — Telehealth: Payer: Self-pay

## 2020-07-10 ENCOUNTER — Other Ambulatory Visit: Payer: Self-pay | Admitting: Family Medicine

## 2020-07-10 DIAGNOSIS — M1611 Unilateral primary osteoarthritis, right hip: Secondary | ICD-10-CM

## 2020-07-10 DIAGNOSIS — F40240 Claustrophobia: Secondary | ICD-10-CM

## 2020-07-10 MED ORDER — DIAZEPAM 5 MG PO TABS: 5.0000 mg | ORAL_TABLET | Freq: Two times a day (BID) | ORAL | 0 refills | Status: DC | PRN

## 2020-07-10 NOTE — Telephone Encounter (Signed)
Done

## 2020-07-10 NOTE — Telephone Encounter (Signed)
Copied from CRM [id number]. Topic: General - Other >> Jul 10, 2020 10:02 AM Elliot Gault wrote: Darl Pikes from Radiology requesting orders be changed to "right hip without contrast" return call to [phone] Radiology

## 2020-07-19 ENCOUNTER — Ambulatory Visit: Payer: Managed Care, Other (non HMO)

## 2020-07-25 ENCOUNTER — Encounter: Payer: Self-pay | Admitting: Family Medicine

## 2020-07-25 NOTE — Telephone Encounter (Signed)
Pt has called again re PA needs to know if should cancel procedure again before tomorrow

## 2020-07-26 ENCOUNTER — Ambulatory Visit
Admission: RE | Admit: 2020-07-26 | Discharge: 2020-07-26 | Disposition: A | Discharge status: Home / Self Care | Payer: Managed Care, Other (non HMO) | Source: Ambulatory Visit | Attending: Family Medicine | Admitting: Family Medicine

## 2020-07-26 ENCOUNTER — Telehealth: Payer: Self-pay | Admitting: Family Medicine

## 2020-07-26 ENCOUNTER — Other Ambulatory Visit: Payer: Self-pay

## 2020-07-26 ENCOUNTER — Other Ambulatory Visit: Payer: Self-pay | Admitting: Family Medicine

## 2020-07-26 DIAGNOSIS — F40240 Claustrophobia: Secondary | ICD-10-CM

## 2020-07-26 DIAGNOSIS — M1611 Unilateral primary osteoarthritis, right hip: Secondary | ICD-10-CM

## 2020-07-26 MED ORDER — GADOBUTROL 1 MMOL/ML IV SOLN
8.0000 mL | Freq: Once | INTRAVENOUS | Status: AC | PRN
  Administered 2020-07-26: 8 mL via INTRAVENOUS

## 2020-07-26 MED ORDER — DIAZEPAM 5 MG PO TABS: 5.0000 mg | ORAL_TABLET | Freq: Two times a day (BID) | ORAL | 0 refills | Status: DC | PRN

## 2020-07-26 NOTE — Telephone Encounter (Signed)
PT calling to f/up on her request / please advise  

## 2020-07-26 NOTE — Telephone Encounter (Signed)
Pt is schedule for MRI at 5 pm today. Pt had 2 diazepam pills she throw away the pills due to she did not know how long the medication last. Pt MRI has been rescheduled. Pt needs 2 more diazepam due anxiety/claustrophobia. [facility] [address] in [place]

## 2020-07-26 NOTE — Telephone Encounter (Signed)
Requested medication (s) are due for refill today: yies  Requested medication (s) are on the active medication list: yes  Last refill:  07/10/20 # 2  Future visit scheduled: yes  Notes to clinic:  Please review for refill. Refill not delegated per protocol    Requested Prescriptions  Pending Prescriptions Disp Refills   diazepam (VALIUM) 5 MG tablet [Pharmacy Med Name: DIAZEPAM 5MG  TABLETS] 2 tablet     Sig: TAKE 1 TABLET BY MOUTH EVERY 12 HOURS AS NEEDED FOR ANXIETY. TAKE 1 TABLET BY MOUTH 30 MINUTES BEFORE PROCEDURE AND MAY REPEAT AT TIME OF PROCEDURE      Not Delegated - Psychiatry:  Anxiolytics/Hypnotics Failed - 07/26/2020  2:49 PM      Failed - This refill cannot be delegated      Failed - Urine Drug Screen completed in last 360 days      Passed - Valid encounter within last 6 months    Recent Outpatient Visits           2 weeks ago Right thigh pain   [facility], MD   1 month ago Right thigh pain   [facility], MD   3 months ago Benign essential hypertension   [facility], [place], MD   6 months ago Obesity (BMI 30.0-34.9)   [facility], MD   9 months ago Benign essential hypertension   [facility], MD       Future Appointments             In 2 months Alba Cory, Carlynn Purl, MD [facility]

## 2020-07-27 ENCOUNTER — Encounter: Payer: Self-pay | Admitting: Family Medicine

## 2020-07-30 NOTE — Telephone Encounter (Signed)
PT need a call back to f/up / please advise

## 2020-08-07 ENCOUNTER — Other Ambulatory Visit: Payer: Self-pay | Admitting: Family Medicine

## 2020-08-07 DIAGNOSIS — E669 Obesity, unspecified: Secondary | ICD-10-CM

## 2020-08-20 ENCOUNTER — Encounter: Payer: Self-pay | Admitting: Family Medicine

## 2020-08-30 ENCOUNTER — Telehealth: Payer: Self-pay | Admitting: Family Medicine

## 2020-08-30 NOTE — Telephone Encounter (Signed)
[name] is calling from [employer] to ask if is ok to rerun the PA for Saxenda. Spoke with Cassandra gave the verbal ok to rerun the PA for the patient.  Key reference Number - [id number] Need to go complete clinical questions online to see if it is approved again  CB- [phone]

## 2020-08-31 NOTE — Telephone Encounter (Signed)
Prior Auth sent- Determination stating unknown currently

## 2020-09-07 ENCOUNTER — Other Ambulatory Visit: Payer: Self-pay | Admitting: Family Medicine

## 2020-09-07 DIAGNOSIS — E7800 Pure hypercholesterolemia, unspecified: Secondary | ICD-10-CM

## 2020-09-27 ENCOUNTER — Encounter: Payer: Self-pay | Admitting: Orthopedic Surgery

## 2020-09-27 ENCOUNTER — Other Ambulatory Visit: Payer: Self-pay | Admitting: Orthopedic Surgery

## 2020-09-27 NOTE — H&P (Signed)
NAME: [name] MRN:   [id number] DOB:   [birth year]     HISTORY AND PHYSICAL  CHIEF COMPLAINT:  Right hip pain  HISTORY:   [name] a 55 y.o. female  with right  Hip Pain Patient complains of right hip pain. Onset of the symptoms was several years ago. Inciting event: known DJD. The patient reports the hip pain is worse with weight bearing and is aggravated by walking. Associated symptoms: none. Aggravating symptoms include: any weight bearing, going up and down stairs and inactivity. Patient has had no prior hip problems. Previous visits for this problem: none. Evaluation to date: plain films, which were abnormal  hip osteoarthritis . Treatment to date: OTC analgesics, which have been somewhat effective, prescription analgesics, which have been somewhat effective, home exercise program, which has been somewhat effective and physical therapy, which has been somewhat effective. Plan for right total hip  PAST MEDICAL HISTORY:   Past Medical History:  Diagnosis Date  . Acid reflux   . GAD (generalized anxiety disorder)   . Hypertension   . OSA (obstructive sleep apnea) 09/08/2019  . Tobacco use     PAST SURGICAL HISTORY:   Past Surgical History:  Procedure Laterality Date  . ABDOMINAL HYSTERECTOMY     Complete Hysterectomy  . CHOLECYSTECTOMY    . GANGLION CYST EXCISION Left 03/01/2018   Procedure: REMOVAL GANGLION OF WRIST;  Surgeon: Deeann Saint, MD;  Location: [facility] ORS;  Service: Orthopedics;  Laterality: Left;    MEDICATIONS:  (Not in a hospital admission)   ALLERGIES:   Allergies  Allergen Reactions  . Penicillins Itching    Has patient had a PCN reaction causing immediate rash, facial/tongue/throat swelling, SOB or lightheadedness with hypotension: No Has patient had a PCN reaction causing severe rash involving mucus membranes or skin necrosis: No Has patient had a PCN reaction that required hospitalization: No Has patient had a PCN reaction occurring within  the last 10 years: No If all of the above answers are "NO", then may proceed with Cephalosporin use.     REVIEW OF SYSTEMS:   Negative except HPI  FAMILY HISTORY:   Family History  Problem Relation Age of Onset  . Hypertension Mother   . Stroke Father   . Hypertension Father   . Hypertension Sister   . Breast cancer Neg Hx     SOCIAL HISTORY:   reports that she has been smoking cigarettes. She started smoking about 21 years ago. She has a 9.00 pack-year smoking history. She has never used smokeless tobacco. She reports current alcohol use of about 1.0 standard drink of alcohol per week. She reports that she does not use drugs.  PHYSICAL EXAM:  General appearance: alert, cooperative and no distress Head: Normocephalic, without obvious abnormality, atraumatic Neck: no JVD and supple, symmetrical, trachea midline Resp: clear to auscultation bilaterally Cardio: regular rate and rhythm, S1, S2 normal, no murmur, click, rub or gallop GI: soft, non-tender; bowel sounds normal; no masses,  no organomegaly Extremities: extremities normal, atraumatic, no cyanosis or edema and Homans sign is negative, no sign of DVT Pulses: 2+ and symmetric Skin: Skin color, texture, turgor normal. No rashes or lesions    LABORATORY STUDIES: No results for input(s): WBC, HGB, HCT, PLT in the last 72 hours.  No results for input(s): NA, K, CL, CO2, GLUCOSE, BUN, CREATININE, CALCIUM in the last 72 hours.  STUDIES/RESULTS:  No results found.  ASSESSMENT:  End stage osteoarthritis right hip  Active Problems:   * No active hospital problems. *    PLAN:  Right Primary Total Hip   Altamese Cabal 09/27/2020. 9:50 AM

## 2020-09-28 ENCOUNTER — Encounter
Admission: RE | Admit: 2020-09-28 | Discharge: 2020-09-28 | Disposition: A | Discharge status: Home / Self Care | Payer: Managed Care, Other (non HMO) | Source: Ambulatory Visit | Attending: Orthopedic Surgery | Admitting: Orthopedic Surgery

## 2020-09-28 ENCOUNTER — Other Ambulatory Visit: Payer: Self-pay

## 2020-09-28 DIAGNOSIS — Z01818 Encounter for other preprocedural examination: Secondary | ICD-10-CM | POA: Diagnosis present

## 2020-09-28 DIAGNOSIS — I10 Essential (primary) hypertension: Secondary | ICD-10-CM | POA: Diagnosis not present

## 2020-09-28 HISTORY — DX: Unspecified osteoarthritis, unspecified site: M19.90

## 2020-09-28 LAB — URINALYSIS, ROUTINE W REFLEX MICROSCOPIC
Bilirubin Urine: NEGATIVE
Glucose, UA: NEGATIVE mg/dL
Ketones, ur: NEGATIVE mg/dL
Leukocytes,Ua: NEGATIVE
Nitrite: NEGATIVE
Protein, ur: NEGATIVE mg/dL
Specific Gravity, Urine: 1.024 (ref 1.005–1.030)
pH: 6 (ref 5.0–8.0)

## 2020-09-28 LAB — APTT: aPTT: 30 seconds (ref 24–36)

## 2020-09-28 LAB — PROTIME-INR
INR: 1 (ref 0.8–1.2)
Prothrombin Time: 12.3 seconds (ref 11.4–15.2)

## 2020-09-28 LAB — CBC
HCT: 43.5 % (ref 36.0–46.0)
Hemoglobin: 13.2 g/dL (ref 12.0–15.0)
MCH: 26.7 pg (ref 26.0–34.0)
MCHC: 30.3 g/dL (ref 30.0–36.0)
MCV: 87.9 fL (ref 80.0–100.0)
Platelets: 267 10*3/uL (ref 150–400)
RBC: 4.95 MIL/uL (ref 3.87–5.11)
RDW: 13.2 % (ref 11.5–15.5)
WBC: 4.9 10*3/uL (ref 4.0–10.5)
nRBC: 0 % (ref 0.0–0.2)

## 2020-09-28 LAB — BASIC METABOLIC PANEL
Anion gap: 8 (ref 5–15)
BUN: 19 mg/dL (ref 6–20)
CO2: 29 mmol/L (ref 22–32)
Calcium: 9.8 mg/dL (ref 8.9–10.3)
Chloride: 106 mmol/L (ref 98–111)
Creatinine, Ser: 0.71 mg/dL (ref 0.44–1.00)
GFR, Estimated: >60 mL/min (ref >60)
Glucose, Bld: 106 mg/dL — ABNORMAL HIGH (ref 70–99)
Potassium: 3.5 mmol/L (ref 3.5–5.1)
Sodium: 143 mmol/L (ref 135–145)

## 2020-09-28 LAB — TYPE AND SCREEN
ABO/RH(D): O NEG
Antibody Screen: NEGATIVE

## 2020-09-28 LAB — SURGICAL PCR SCREEN
MRSA, PCR: NEGATIVE
Staphylococcus aureus: NEGATIVE

## 2020-09-28 NOTE — Patient Instructions (Addendum)
Your procedure is scheduled on:10-10-20 WEDNESDAY Report to the Registration Desk on the 1st floor of the Medical Mall-Then proceed to the 2nd floor Surgery Desk in the Medical Mall To find out your arrival time, please call [phone] between 1PM - 3PM on:10-09-20 TUESDAY  REMEMBER: Instructions that are not followed completely may result in serious medical risk, up to and including death; or upon the discretion of your surgeon and anesthesiologist your surgery may need to be rescheduled.  Do not eat food after midnight the night before surgery.  No gum chewing, lozengers or hard candies.  You may however, drink CLEAR liquids up to 2 hours before you are scheduled to arrive for your surgery. Do not drink anything within 2 hours of your scheduled arrival time.  Clear liquids include: - water  - apple juice without pulp - gatorade  - black coffee or tea (Do NOT add milk or creamers to the coffee or tea) Do NOT drink anything that is not on this list.  TAKE THESE MEDICATIONS THE MORNING OF SURGERY WITH A SIP OF WATER: -CRESTOR (ROSUVASTATIN) -PRILOSEC (OMEPRAZOLE)-take one the night before and one on the morning of surgery - helps to prevent nausea after surgery.)  STOP YOUR ASPIRIN 7 DAYS PRIOR TO SURGERY  One week prior to surgery: Stop Anti-inflammatories (NSAIDS) such as Advil, Aleve, Ibuprofen, Motrin, Naproxen, Naprosyn and Aspirin based products such as Excedrin, Goodys Powder, BC Powder-OK TO TAKE TYLENOL IF NEEDED  Stop ANY OVER THE COUNTER supplements until after surgery.  No Alcohol for 24 hours before or after surgery.  No Smoking including e-cigarettes for 24 hours prior to surgery.  No chewable tobacco products for at least 6 hours prior to surgery.  No nicotine patches on the day of surgery.  Do not use any "recreational" drugs for at least a week prior to your surgery.  Please be advised that the combination of cocaine and anesthesia may have negative outcomes, up  to and including death. If you test positive for cocaine, your surgery will be cancelled.  On the morning of surgery brush your teeth with toothpaste and water, you may rinse your mouth with mouthwash if you wish. Do not swallow any toothpaste or mouthwash.  Do not wear jewelry, make-up, hairpins, clips or nail polish.  Do not wear lotions, powders, or perfumes.   Do not shave body from the neck down 48 hours prior to surgery just in case you cut yourself which could leave a site for infection.  Also, freshly shaved skin may become irritated if using the CHG soap.  Contact lenses, hearing aids and dentures may not be worn into surgery.  Do not bring valuables to the hospital. [facility] is not responsible for any missing/lost belongings or valuables.   Use CHG Soap as directed on instruction sheet.  Bring your C-PAP to the hospital with you in case you may have to spend the night.   Notify your doctor if there is any change in your medical condition (cold, fever, infection).  Wear comfortable clothing (specific to your surgery type) to the hospital.  Plan for stool softeners for home use; pain medications have a tendency to cause constipation. You can also help prevent constipation by eating foods high in fiber such as fruits and vegetables and drinking plenty of fluids as your diet allows.  After surgery, you can help prevent lung complications by doing breathing exercises.  Take deep breaths and cough every 1-2 hours. Your doctor may order a device  called an Facilities manager to help you take deep breaths. When coughing or sneezing, hold a pillow firmly against your incision with both hands. This is called "splinting." Doing this helps protect your incision. It also decreases belly discomfort.  If you are being admitted to the hospital overnight, leave your suitcase in the car. After surgery it may be brought to your room.  If you are being discharged the day of surgery, you  will not be allowed to drive home. You will need a responsible adult (18 years or older) to drive you home and stay with you that night.   If you are taking public transportation, you will need to have a responsible adult (18 years or older) with you. Please confirm with your physician that it is acceptable to use public transportation.   Please call the Pre-admissions Testing Dept. at [phone] if you have any questions about these instructions.  Surgery Visitation Policy:  Patients undergoing a surgery or procedure may have one family member or support person with them as long as that person is not COVID-19 positive or experiencing its symptoms.  That person may remain in the waiting area during the procedure.  Inpatient Visitation:    Visiting hours are 7 a.m. to 8 p.m. Inpatients will be allowed two visitors daily. The visitors may change each day during the patient's stay. No visitors under the age of 98. Any visitor under the age of 18 must be accompanied by an adult. The visitor must pass COVID-19 screenings, use hand sanitizer when entering and exiting the patient's room and wear a mask at all times, including in the patient's room. Patients must also wear a mask when staff or their visitor are in the room. Masking is required regardless of vaccination status.

## 2020-10-02 ENCOUNTER — Other Ambulatory Visit: Payer: Self-pay | Admitting: Family Medicine

## 2020-10-02 DIAGNOSIS — F411 Generalized anxiety disorder: Secondary | ICD-10-CM

## 2020-10-04 NOTE — Progress Notes (Signed)
Name: [name]   MRN: [id number]    DOB: May 22, 1966   Date:10/08/2020       Progress Note  Subjective  Chief Complaint  Follow Up  HPI  Right thigh pain: she states symptoms started months ago , we sent her to [facility] and was advised to get PT and was given a muscle relaxer. She states symptoms got progressively worse, she went back to Ortho and will have right hip replacement this week.   Obesity: BMI above 30 but right under 35. She was going to the weight loss clinic, she states phentermine worked well for her, but since she has been off medication bp is at goal. She states she stopped going to the weight loss clinic because of cost. She became obese in her early 20's and she has tried multiple diets. Such as Weight Watchers, cabbage diet, Atkins , she loses weight on her diet but gains it back. . She tried Ozempic but stopped on her own , she came in in June and stated a friend was taking [place] and we started her on medication , she lost 20 lbs and states it has curbed her appetite but she has not been very active due to right leg pain, out of Saxenda for the past 2 months, she would like a refill today, but will resume at 0.6 mg after surgery   HTN:bp is at goal at [place]  40/5/25 , bp is at goal, continue medications. She denies chest pain, palpitation or dizziness. BP at home also around 120's   Anxiety: she continues to feel anxious. Symptoms started when her son went to prison around 2014 for selling drugs, he is out of jail, but he was in a fight and got arrested and they bailed him out,  He has not been to court yet, court date is in May because he failed drug test. She stopped Prozac because it caused sedation. She tried xanax and it worked well for her, explained that I don't rx alprazolam but we can try buspar prn.   GERD: she states she has been taking Tums prn, she has been out of Omeprazole, she states symptoms are getting more frequent again and would like  refill of PPI. No regurgitation. Just has heartburn , depending on diet   Pre-diabetes: she denies polyphagia, polyuria or polydipsia. Losing weight, happy with it, last A1C was 6.3 %, we will recheck next visit. Resume Saxenda   OSA: also has LVH,  hypertension, interrupted sleep- She went to pulmonologist - Dr. Craige Cotta , has moderate OSA, but never got the supplies. Advised her to contact pulmonologist     Hyperlipidemia:   The 10-year ASCVD risk score Denman George DC Montez Hageman., et al., 2013) is: 5.8%   Values used to calculate the score:     Age: 62 years     Sex: Female     Is Non-Hispanic African American: Yes     Diabetic: No     Tobacco smoker: Yes     Systolic Blood Pressure: 126 mmHg     Is BP treated: No     HDL Cholesterol: 50 mg/dL     Total Cholesterol: 211 mg/dL   Elevated sed rate: labs done 03/2020, at 46, she will have knee replacement surgery soon, we will hold off on rechecking level until 6 months post-op   Patient Active Problem List   Diagnosis Date Noted  . OSA (obstructive sleep apnea) 09/08/2019  . Ganglion cyst of dorsum of left  wrist 02/05/2018  . LVH (left ventricular hypertrophy) due to hypertensive disease, without heart failure 09/23/2017  . SOBOE (shortness of breath on exertion) 09/23/2017  . Hyperlipidemia, mixed 08/28/2017  . Hyperglycemia 08/25/2017  . Hypercalcemia due to thiazide and vitamin A 08/25/2017  . Hypertension 08/18/2017  . GAD (generalized anxiety disorder) 08/18/2017  . GERD (gastroesophageal reflux disease) 08/18/2017  . Panic attack 08/18/2017  . Tobacco use 08/18/2017  . Systolic ejection murmur 08/18/2017    Past Surgical History:  Procedure Laterality Date  . ABDOMINAL HYSTERECTOMY     Complete Hysterectomy  . CHOLECYSTECTOMY    . GANGLION CYST EXCISION Left 03/01/2018   Procedure: REMOVAL GANGLION OF WRIST;  Surgeon: Deeann Saint, MD;  Location: [facility] ORS;  Service: Orthopedics;  Laterality: Left;    Family History  Problem  Relation Age of Onset  . Hypertension Mother   . Stroke Father   . Hypertension Father   . Hypertension Sister   . Breast cancer Neg Hx     Social History   Tobacco Use  . Smoking status: Current Every Day Smoker    Packs/day: 0.50    Years: 18.00    Pack years: 9.00    Types: Cigarettes    Start date: 08/19/1999  . Smokeless tobacco: Never Used  Substance Use Topics  . Alcohol use: Yes    Alcohol/week: 1.0 standard drink    Types: 1 Glasses of wine per week    Comment: rare     Current Outpatient Medications:  .  aspirin EC 81 MG tablet, Take 162 mg by mouth daily as needed for moderate pain. , Disp: , Rfl:  .  fluticasone (FLONASE) 50 MCG/ACT nasal spray, Place 2 sprays into both nostrils daily. (Patient taking differently: Place 2 sprays into both nostrils daily as needed for allergies.), Disp: 48 g, Rfl: 0 .  Insulin Pen Needle (NOVOFINE) 32G X 6 MM MISC, 1 each by Does not apply route daily., Disp: 100 each, Rfl: 2 .  Olmesartan-amLODIPine-HCTZ 40-5-25 MG TABS, Take 1 tablet by mouth daily. (Patient taking differently: Take 1 tablet by mouth every morning.), Disp: 90 tablet, Rfl: 0 .  omeprazole (PRILOSEC) 40 MG capsule, Take 1 capsule (40 mg total) by mouth daily. (Patient taking differently: Take 40 mg by mouth daily as needed (heartburn).), Disp: 30 capsule, Rfl: 0 .  rosuvastatin (CRESTOR) 10 MG tablet, TAKE 1 TABLET BY MOUTH DAILY, IN PLACE OF ATORVASTATIN (Patient taking differently: Take by mouth every morning.), Disp: 90 tablet, Rfl: 0 .  SAXENDA 18 MG/3ML SOPN, INJECT 3 MG SUBCUTANEOUSLY  ONCE DAILY, Disp: 15 mL, Rfl: 0 .  FLUoxetine (PROZAC) 10 MG tablet, TAKE 1 TABLET(10 MG) BY MOUTH DAILY (Patient not taking: Reported on 10/08/2020), Disp: 90 tablet, Rfl: 0  Allergies  Allergen Reactions  . Penicillins Itching and Swelling    Has patient had a PCN reaction causing immediate rash, facial/tongue/throat swelling, SOB or lightheadedness with hypotension: No Has  patient had a PCN reaction causing severe rash involving mucus membranes or skin necrosis: No Has patient had a PCN reaction that required hospitalization: No Has patient had a PCN reaction occurring within the last 10 years: No If all of the above answers are "NO", then may proceed with Cephalosporin use.     I personally reviewed active problem list, medication list, allergies, family history, social history, health maintenance with the patient/caregiver today.   ROS  Constitutional: Negative for fever or weight change.  Respiratory: Negative for cough and shortness  of breath.   Cardiovascular: Negative for chest pain or palpitations.  Gastrointestinal: Negative for abdominal pain, no bowel changes.  Musculoskeletal: Negative for gait problem or joint swelling.  Skin: Negative for rash.  Neurological: Negative for dizziness or headache.  No other specific complaints in a complete review of systems (except as listed in HPI above).  Objective  Vitals:   10/08/20 0816  BP: 126/70  Pulse: 86  Resp: 16  Temp: 98.3 F (36.8 C)  TempSrc: Oral  SpO2: 98%  Weight: 172 lb (78 kg)  Height: 5\' 2"  (1.575 m)    Body mass index is 31.46 kg/m.  Physical Exam  Constitutional: Patient appears well-developed and well-nourished. Obese  No distress.  HEENT: head atraumatic, normocephalic, pupils equal and reactive to light, neck supple Cardiovascular: Normal rate, regular rhythm and normal heart sounds.  No murmur heard. No BLE edema. Pulmonary/Chest: Effort normal and breath sounds normal. No respiratory distress. Abdominal: Soft.  There is no tenderness. Psychiatric: Patient has a normal mood and affect. behavior is normal. Judgment and thought content normal.  Recent Results (from the past 2160 hour(s))  CBC     Status: None   Collection Time: 09/28/20 10:16 AM  Result Value Ref Range   WBC 4.9 4.0 - 10.5 K/uL   RBC 4.95 3.87 - 5.11 MIL/uL   Hemoglobin 13.2 12.0 - 15.0 g/dL    HCT 11/28/20 61.4 - 43.1 %   MCV 87.9 80.0 - 100.0 fL   MCH 26.7 26.0 - 34.0 pg   MCHC 30.3 30.0 - 36.0 g/dL   RDW 54.0 08.6 - 76.1 %   Platelets 267 150 - 400 K/uL   nRBC 0.0 0.0 - 0.2 %    Comment: Performed at [facility], [address], [place], [place]  Basic metabolic panel     Status: Abnormal   Collection Time: 09/28/20 10:16 AM  Result Value Ref Range   Sodium 143 135 - 145 mmol/L   Potassium 3.5 3.5 - 5.1 mmol/L   Chloride 106 98 - 111 mmol/L   CO2 29 22 - 32 mmol/L   Glucose, Bld 106 (H) 70 - 99 mg/dL    Comment: Glucose reference range applies only to samples taken after fasting for at least 8 hours.   BUN 19 6 - 20 mg/dL   Creatinine, Ser 11/28/20 0.44 - 1.00 mg/dL   Calcium 9.8 8.9 - 1.24 mg/dL   GFR, Estimated 58.0 >99 mL/min    Comment: (NOTE) Calculated using the CKD-EPI Creatinine Equation (2021)    Anion gap 8 5 - 15    Comment: Performed at [facility], [address] [place], [place]  Protime-INR     Status: None   Collection Time: 09/28/20 10:16 AM  Result Value Ref Range   Prothrombin Time 12.3 11.4 - 15.2 seconds   INR 1.0 0.8 - 1.2    Comment: (NOTE) INR goal varies based on device and disease states. Performed at [facility], [address] [place], [place]   APTT     Status: None   Collection Time: 09/28/20 10:16 AM  Result Value Ref Range   aPTT 30 24 - 36 seconds    Comment: Performed at [facility], [address] [place], [place]  Urinalysis, Routine w reflex microscopic Urine, Clean Catch     Status: Abnormal   Collection Time: 09/28/20 10:16 AM  Result Value Ref Range   Color, Urine YELLOW (  A) YELLOW   APPearance CLEAR (A) CLEAR   Specific Gravity, Urine 1.024 1.005 - 1.030   pH 6.0 5.0 - 8.0   Glucose, UA NEGATIVE NEGATIVE mg/dL   Hgb urine dipstick SMALL (A) NEGATIVE   Bilirubin Urine NEGATIVE NEGATIVE   Ketones, ur NEGATIVE NEGATIVE mg/dL   Protein, ur  NEGATIVE NEGATIVE mg/dL   Nitrite NEGATIVE NEGATIVE   Leukocytes,Ua NEGATIVE NEGATIVE   RBC / HPF 6-10 0 - 5 RBC/hpf   WBC, UA 0-5 0 - 5 WBC/hpf   Bacteria, UA RARE (A) NONE SEEN   Squamous Epithelial / LPF 0-5 0 - 5   Mucus PRESENT     Comment: Performed at [facility], [address] [place], [place] [address]  Type and screen [facility]     Status: None   Collection Time: 09/28/20 10:16 AM  Result Value Ref Range   ABO/RH(D) O NEG    Antibody Screen NEG    Sample Expiration 10/12/2020,2359    Extend sample reason      NO TRANSFUSIONS OR PREGNANCY IN THE PAST 3 MONTHS Performed at [facility], [address] [place], [place] [address]   Surgical pcr screen     Status: None   Collection Time: 09/28/20 10:16 AM   Specimen: Nasal Mucosa; Nasal Swab  Result Value Ref Range   MRSA, PCR NEGATIVE NEGATIVE   Staphylococcus aureus NEGATIVE NEGATIVE    Comment: (NOTE) The Xpert SA Assay (FDA approved for NASAL specimens in patients 53 years of age and older), is one component of a comprehensive surveillance program. It is not intended to diagnose infection nor to guide or monitor treatment. Performed at [facility], [address] [place], [place] [address]     PHQ2/9: Depression screen [facility] 10/08/2020 07/06/2020 06/12/2020 04/10/2020 01/09/2020  Decreased Interest 0 0 0 0 0  Down, Depressed, Hopeless 0 0 0 0 0  PHQ - 2 Score 0 0 0 0 0  Altered sleeping - - - - 0  Tired, decreased energy - - - - 0  Change in appetite - - - - 0  Feeling bad or failure about yourself  - - - - 0  Trouble concentrating - - - - 0  Moving slowly or fidgety/restless - - - - 0  Suicidal thoughts - - - - 0  PHQ-9 Score - - - - 0  Difficult doing work/chores - - - - -  Some recent data might be hidden    phq 9 is negative   Fall Risk: Fall Risk  10/08/2020 07/06/2020 06/12/2020 04/10/2020 01/09/2020  Falls in the past year? 0 0 1 0 0   Number falls in past yr: 0 0 0 0 0  Injury with Fall? 0 0 0 0 -     Functional Status Survey: Is the patient deaf or have difficulty hearing?: No Does the patient have difficulty seeing, even when wearing glasses/contacts?: No Does the patient have difficulty concentrating, remembering, or making decisions?: No Does the patient have difficulty walking or climbing stairs?: No Does the patient have difficulty dressing or bathing?: No Does the patient have difficulty doing errands alone such as visiting a doctor's office or shopping?: No   Assessment & Plan  1. Gastroesophageal reflux disease without esophagitis  - omeprazole (PRILOSEC) 40 MG capsule; Take 1 capsule (40 mg total) by mouth daily as needed (heartburn).  Dispense: 90 capsule; Refill: 1  2. OSA (obstructive sleep apnea)  Needs  to contact pulmonologist   3. GAD (generalized anxiety disorder)  - busPIRone (BUSPAR) 5 MG tablet; Take 1 tablet (5 mg total) by mouth 3 (three) times daily as needed.  Dispense: 30 tablet; Refill: 0  4. Primary osteoarthritis of right hip   5. Benign essential hypertension  - Olmesartan-amLODIPine-HCTZ 40-5-25 MG TABS; Take 1 tablet by mouth every morning.  Dispense: 90 tablet; Refill: 1  6. Pre-diabetes   7. Colon cancer screening  - Cologuard  8. LVH (left ventricular hypertrophy) due to hypertensive disease, without heart failure   9. Obesity (BMI 30.0-34.9)  - Liraglutide -Weight Management (SAXENDA) 18 MG/3ML SOPN; Inject 3 mg into the skin daily.  Dispense: 15 mL; Refill: 2  10. Environmental and seasonal allergies  - fluticasone (FLONASE) 50 MCG/ACT nasal spray; Place 2 sprays into both nostrils daily as needed for allergies.  Dispense: 48 g; Refill: 1  11. Hyperglycemia   12. Pure hypercholesterolemia  - rosuvastatin (CRESTOR) 10 MG tablet; Take 1 tablet (10 mg total) by mouth every morning.  Dispense: 90 tablet; Refill: 1  13. Primary osteoarthritis of both  hips  Having right hip replacement this week

## 2020-10-08 ENCOUNTER — Other Ambulatory Visit: Payer: Self-pay

## 2020-10-08 ENCOUNTER — Encounter: Payer: Self-pay | Admitting: Family Medicine

## 2020-10-08 ENCOUNTER — Other Ambulatory Visit
Admission: RE | Admit: 2020-10-08 | Discharge: 2020-10-08 | Disposition: A | Discharge status: Home / Self Care | Payer: Managed Care, Other (non HMO) | Source: Ambulatory Visit | Attending: Orthopedic Surgery | Admitting: Orthopedic Surgery

## 2020-10-08 ENCOUNTER — Ambulatory Visit (INDEPENDENT_AMBULATORY_CARE_PROVIDER_SITE_OTHER): Payer: Managed Care, Other (non HMO) | Admitting: Family Medicine

## 2020-10-08 VITALS — BP 126/70 | HR 86 | Temp 98.3°F | Resp 16 | Ht 62.0 in | Wt 172.0 lb

## 2020-10-08 DIAGNOSIS — Z20822 Contact with and (suspected) exposure to covid-19: Secondary | ICD-10-CM | POA: Diagnosis not present

## 2020-10-08 DIAGNOSIS — J3089 Other allergic rhinitis: Secondary | ICD-10-CM

## 2020-10-08 DIAGNOSIS — M1611 Unilateral primary osteoarthritis, right hip: Secondary | ICD-10-CM | POA: Diagnosis not present

## 2020-10-08 DIAGNOSIS — I10 Essential (primary) hypertension: Secondary | ICD-10-CM

## 2020-10-08 DIAGNOSIS — Z01812 Encounter for preprocedural laboratory examination: Secondary | ICD-10-CM | POA: Insufficient documentation

## 2020-10-08 DIAGNOSIS — G4733 Obstructive sleep apnea (adult) (pediatric): Secondary | ICD-10-CM

## 2020-10-08 DIAGNOSIS — R7303 Prediabetes: Secondary | ICD-10-CM

## 2020-10-08 DIAGNOSIS — Z1211 Encounter for screening for malignant neoplasm of colon: Secondary | ICD-10-CM

## 2020-10-08 DIAGNOSIS — E7800 Pure hypercholesterolemia, unspecified: Secondary | ICD-10-CM

## 2020-10-08 DIAGNOSIS — K219 Gastro-esophageal reflux disease without esophagitis: Secondary | ICD-10-CM

## 2020-10-08 DIAGNOSIS — E669 Obesity, unspecified: Secondary | ICD-10-CM

## 2020-10-08 DIAGNOSIS — I119 Hypertensive heart disease without heart failure: Secondary | ICD-10-CM

## 2020-10-08 DIAGNOSIS — R739 Hyperglycemia, unspecified: Secondary | ICD-10-CM

## 2020-10-08 DIAGNOSIS — F411 Generalized anxiety disorder: Secondary | ICD-10-CM | POA: Diagnosis not present

## 2020-10-08 DIAGNOSIS — M160 Bilateral primary osteoarthritis of hip: Secondary | ICD-10-CM

## 2020-10-08 LAB — SARS CORONAVIRUS 2 (TAT 6-24 HRS): SARS Coronavirus 2: NEGATIVE

## 2020-10-08 MED ORDER — SAXENDA 18 MG/3ML ~~LOC~~ SOPN
3.0000 mg | PEN_INJECTOR | Freq: Every day | SUBCUTANEOUS | 2 refills | Status: DC
Start: 2020-10-08 — End: 2021-02-04

## 2020-10-08 MED ORDER — ROSUVASTATIN CALCIUM 10 MG PO TABS: 10.0000 mg | ORAL_TABLET | ORAL | 1 refills | Status: DC

## 2020-10-08 MED ORDER — OMEPRAZOLE 40 MG PO CPDR: 40.0000 mg | DELAYED_RELEASE_CAPSULE | Freq: Every day | ORAL | 1 refills | Status: DC | PRN

## 2020-10-08 MED ORDER — OLMESARTAN-AMLODIPINE-HCTZ 40-5-25 MG PO TABS: 1.0000 | ORAL_TABLET | ORAL | 1 refills | Status: DC

## 2020-10-08 MED ORDER — BUSPIRONE HCL 5 MG PO TABS: 5.0000 mg | ORAL_TABLET | Freq: Three times a day (TID) | ORAL | 0 refills | Status: DC | PRN

## 2020-10-08 MED ORDER — FLUTICASONE PROPIONATE 50 MCG/ACT NA SUSP: 2.0000 | Freq: Every day | NASAL | 1 refills | Status: DC | PRN

## 2020-10-09 ENCOUNTER — Telehealth: Payer: Self-pay | Admitting: Family Medicine

## 2020-10-09 MED ORDER — LACTATED RINGERS IV SOLN: INTRAVENOUS | Status: DC

## 2020-10-09 MED ORDER — POVIDONE-IODINE 10 % EX SWAB: 2.0000 "application " | Freq: Once | CUTANEOUS | Status: DC

## 2020-10-09 MED ORDER — CHLORHEXIDINE GLUCONATE 0.12 % MT SOLN: 15.0000 mL | Freq: Once | OROMUCOSAL | Status: AC

## 2020-10-09 MED ORDER — ORAL CARE MOUTH RINSE: 15.0000 mL | Freq: Once | OROMUCOSAL | Status: AC

## 2020-10-09 MED ORDER — CLINDAMYCIN PHOSPHATE 900 MG/50ML IV SOLN
900.0000 mg | INTRAVENOUS | Status: AC
  Administered 2020-10-10: 900 mg via INTRAVENOUS

## 2020-10-09 MED ORDER — TRANEXAMIC ACID-NACL 1000-0.7 MG/100ML-% IV SOLN
1000.0000 mg | INTRAVENOUS | Status: AC
  Administered 2020-10-10: 1000 mg via INTRAVENOUS

## 2020-10-09 NOTE — Telephone Encounter (Signed)
Caller/Agency: [facility]  Callback Number: [phone] Dow Adolph called to report that the patient is experiencing 4% more pain than last week

## 2020-10-10 ENCOUNTER — Ambulatory Visit: Payer: Managed Care, Other (non HMO) | Admitting: Certified Registered Nurse Anesthetist

## 2020-10-10 ENCOUNTER — Encounter
Admission: RE | Disposition: A | Discharge status: Home / Self Care | Payer: Self-pay | Source: Home / Self Care | Attending: Orthopedic Surgery

## 2020-10-10 ENCOUNTER — Ambulatory Visit: Payer: Managed Care, Other (non HMO)

## 2020-10-10 ENCOUNTER — Encounter: Payer: Self-pay | Admitting: Orthopedic Surgery

## 2020-10-10 ENCOUNTER — Other Ambulatory Visit: Payer: Self-pay

## 2020-10-10 ENCOUNTER — Observation Stay
Admission: RE | Admit: 2020-10-10 | Discharge: 2020-10-11 | Disposition: A | Discharge status: Home / Self Care | Payer: Managed Care, Other (non HMO) | Attending: Orthopedic Surgery | Admitting: Orthopedic Surgery

## 2020-10-10 DIAGNOSIS — F17200 Nicotine dependence, unspecified, uncomplicated: Secondary | ICD-10-CM | POA: Insufficient documentation

## 2020-10-10 DIAGNOSIS — Z79899 Other long term (current) drug therapy: Secondary | ICD-10-CM | POA: Insufficient documentation

## 2020-10-10 DIAGNOSIS — I10 Essential (primary) hypertension: Secondary | ICD-10-CM | POA: Insufficient documentation

## 2020-10-10 DIAGNOSIS — Z7982 Long term (current) use of aspirin: Secondary | ICD-10-CM | POA: Diagnosis not present

## 2020-10-10 DIAGNOSIS — M1611 Unilateral primary osteoarthritis, right hip: Secondary | ICD-10-CM | POA: Diagnosis present

## 2020-10-10 DIAGNOSIS — Z419 Encounter for procedure for purposes other than remedying health state, unspecified: Secondary | ICD-10-CM

## 2020-10-10 DIAGNOSIS — Z96641 Presence of right artificial hip joint: Secondary | ICD-10-CM

## 2020-10-10 HISTORY — PX: TOTAL HIP ARTHROPLASTY: SHX124

## 2020-10-10 LAB — ABO/RH: ABO/RH(D): O NEG

## 2020-10-10 SURGERY — ARTHROPLASTY, HIP, TOTAL, ANTERIOR APPROACH
Anesthesia: Spinal | Site: Hip | Laterality: Right

## 2020-10-10 MED ORDER — MAGNESIUM CITRATE PO SOLN
1.0000 | Freq: Once | ORAL | Status: DC | PRN
  Filled 2020-10-10: qty 296

## 2020-10-10 MED ORDER — PROPOFOL 10 MG/ML IV BOLUS
INTRAVENOUS | Status: DC | PRN
  Administered 2020-10-10: 20 mg via INTRAVENOUS

## 2020-10-10 MED ORDER — CHLORHEXIDINE GLUCONATE 0.12 % MT SOLN
OROMUCOSAL | Status: AC
  Administered 2020-10-10: 15 mL via OROMUCOSAL
  Filled 2020-10-10: qty 15

## 2020-10-10 MED ORDER — PROPOFOL 500 MG/50ML IV EMUL
INTRAVENOUS | Status: DC | PRN
  Administered 2020-10-10: 75 ug/kg/min via INTRAVENOUS

## 2020-10-10 MED ORDER — BUPIVACAINE-EPINEPHRINE (PF) 0.25% -1:200000 IJ SOLN
INTRAMUSCULAR | Status: DC | PRN
  Administered 2020-10-10: 20 mL via PERINEURAL

## 2020-10-10 MED ORDER — AMLODIPINE BESYLATE 5 MG PO TABS: 5.0000 mg | ORAL_TABLET | Freq: Every day | ORAL | Status: DC

## 2020-10-10 MED ORDER — FLUTICASONE PROPIONATE 50 MCG/ACT NA SUSP
2.0000 | Freq: Every day | NASAL | Status: DC | PRN
  Filled 2020-10-10: qty 16

## 2020-10-10 MED ORDER — BUPIVACAINE-EPINEPHRINE (PF) 0.25% -1:200000 IJ SOLN
INTRAMUSCULAR | Status: AC
  Filled 2020-10-10: qty 30

## 2020-10-10 MED ORDER — METOCLOPRAMIDE HCL 10 MG PO TABS: 5.0000 mg | ORAL_TABLET | Freq: Three times a day (TID) | ORAL | Status: DC | PRN

## 2020-10-10 MED ORDER — ONDANSETRON HCL 4 MG/2ML IJ SOLN
INTRAMUSCULAR | Status: DC | PRN
  Administered 2020-10-10: 4 mg via INTRAVENOUS

## 2020-10-10 MED ORDER — LACTATED RINGERS IV SOLN: INTRAVENOUS | Status: DC

## 2020-10-10 MED ORDER — SEVOFLURANE IN SOLN
RESPIRATORY_TRACT | Status: AC
  Filled 2020-10-10: qty 250

## 2020-10-10 MED ORDER — DEXAMETHASONE SODIUM PHOSPHATE 10 MG/ML IJ SOLN
INTRAMUSCULAR | Status: DC | PRN
  Administered 2020-10-10: 10 mg via INTRAVENOUS

## 2020-10-10 MED ORDER — LIRAGLUTIDE -WEIGHT MANAGEMENT 18 MG/3ML ~~LOC~~ SOPN: 3.0000 mg | PEN_INJECTOR | Freq: Every day | SUBCUTANEOUS | Status: DC

## 2020-10-10 MED ORDER — ALUM & MAG HYDROXIDE-SIMETH 200-200-20 MG/5ML PO SUSP: 30.0000 mL | ORAL | Status: DC | PRN

## 2020-10-10 MED ORDER — PHENYLEPHRINE HCL (PRESSORS) 10 MG/ML IV SOLN
INTRAVENOUS | Status: DC | PRN
  Administered 2020-10-10 (×2): 100 ug via INTRAVENOUS
  Administered 2020-10-10: 50 ug via INTRAVENOUS
  Administered 2020-10-10: 100 ug via INTRAVENOUS

## 2020-10-10 MED ORDER — BUSPIRONE HCL 10 MG PO TABS
5.0000 mg | ORAL_TABLET | Freq: Three times a day (TID) | ORAL | Status: DC | PRN
  Administered 2020-10-10: 5 mg via ORAL
  Filled 2020-10-10: qty 1

## 2020-10-10 MED ORDER — HYDROCHLOROTHIAZIDE 25 MG PO TABS
25.0000 mg | ORAL_TABLET | Freq: Every day | ORAL | Status: DC
  Administered 2020-10-10 – 2020-10-11 (×2): 25 mg via ORAL
  Filled 2020-10-10 (×2): qty 1

## 2020-10-10 MED ORDER — MORPHINE SULFATE (PF) 2 MG/ML IV SOLN
0.5000 mg | INTRAVENOUS | Status: DC | PRN
Start: 2020-10-10 — End: 2020-10-11

## 2020-10-10 MED ORDER — TRANEXAMIC ACID-NACL 1000-0.7 MG/100ML-% IV SOLN
INTRAVENOUS | Status: AC
  Filled 2020-10-10: qty 100

## 2020-10-10 MED ORDER — ACETAMINOPHEN 325 MG PO TABS: 325.0000 mg | ORAL_TABLET | Freq: Four times a day (QID) | ORAL | Status: DC | PRN

## 2020-10-10 MED ORDER — ONDANSETRON HCL 4 MG/2ML IJ SOLN: 4.0000 mg | Freq: Four times a day (QID) | INTRAMUSCULAR | Status: DC | PRN

## 2020-10-10 MED ORDER — KETOROLAC TROMETHAMINE 15 MG/ML IJ SOLN
15.0000 mg | Freq: Four times a day (QID) | INTRAMUSCULAR | Status: AC
  Administered 2020-10-10 – 2020-10-11 (×4): 15 mg via INTRAVENOUS
  Filled 2020-10-10 (×4): qty 1

## 2020-10-10 MED ORDER — PANTOPRAZOLE SODIUM 40 MG PO TBEC
40.0000 mg | DELAYED_RELEASE_TABLET | Freq: Every day | ORAL | Status: DC
  Administered 2020-10-10 – 2020-10-11 (×2): 40 mg via ORAL
  Filled 2020-10-10 (×2): qty 1

## 2020-10-10 MED ORDER — FENTANYL CITRATE (PF) 100 MCG/2ML IJ SOLN
25.0000 ug | INTRAMUSCULAR | Status: DC | PRN
Start: 2020-10-10 — End: 2020-10-10

## 2020-10-10 MED ORDER — IRBESARTAN 150 MG PO TABS
300.0000 mg | ORAL_TABLET | Freq: Every day | ORAL | Status: DC
  Administered 2020-10-10 – 2020-10-11 (×2): 300 mg via ORAL
  Filled 2020-10-10 (×2): qty 2

## 2020-10-10 MED ORDER — FENTANYL CITRATE (PF) 100 MCG/2ML IJ SOLN
INTRAMUSCULAR | Status: AC
  Filled 2020-10-10: qty 2

## 2020-10-10 MED ORDER — MIDAZOLAM HCL 5 MG/5ML IJ SOLN
INTRAMUSCULAR | Status: DC | PRN
  Administered 2020-10-10: 2 mg via INTRAVENOUS

## 2020-10-10 MED ORDER — CLINDAMYCIN PHOSPHATE 900 MG/50ML IV SOLN
INTRAVENOUS | Status: AC
  Filled 2020-10-10: qty 50

## 2020-10-10 MED ORDER — ACETAMINOPHEN 10 MG/ML IV SOLN
INTRAVENOUS | Status: DC | PRN
  Administered 2020-10-10: 1000 mg via INTRAVENOUS

## 2020-10-10 MED ORDER — ACETAMINOPHEN 10 MG/ML IV SOLN
INTRAVENOUS | Status: AC
  Filled 2020-10-10: qty 100

## 2020-10-10 MED ORDER — MIDAZOLAM HCL 2 MG/2ML IJ SOLN
INTRAMUSCULAR | Status: AC
  Filled 2020-10-10: qty 2

## 2020-10-10 MED ORDER — IRBESARTAN 150 MG PO TABS: 300.0000 mg | ORAL_TABLET | Freq: Every day | ORAL | Status: DC

## 2020-10-10 MED ORDER — KETOROLAC TROMETHAMINE 30 MG/ML IJ SOLN
INTRAMUSCULAR | Status: DC | PRN
  Administered 2020-10-10: 30 mg via INTRAVENOUS

## 2020-10-10 MED ORDER — HYDROCODONE-ACETAMINOPHEN 7.5-325 MG PO TABS
1.0000 | ORAL_TABLET | ORAL | Status: DC | PRN
  Administered 2020-10-10: 2 via ORAL
  Filled 2020-10-10: qty 2

## 2020-10-10 MED ORDER — MENTHOL 3 MG MT LOZG
1.0000 | LOZENGE | OROMUCOSAL | Status: DC | PRN
  Filled 2020-10-10: qty 9

## 2020-10-10 MED ORDER — ONDANSETRON HCL 4 MG PO TABS: 4.0000 mg | ORAL_TABLET | Freq: Four times a day (QID) | ORAL | Status: DC | PRN

## 2020-10-10 MED ORDER — OXYCODONE HCL 5 MG/5ML PO SOLN: 5.0000 mg | Freq: Once | ORAL | Status: DC | PRN

## 2020-10-10 MED ORDER — HYDROCHLOROTHIAZIDE 25 MG PO TABS: 25.0000 mg | ORAL_TABLET | Freq: Every day | ORAL | Status: DC

## 2020-10-10 MED ORDER — MAGNESIUM HYDROXIDE 400 MG/5ML PO SUSP
30.0000 mL | Freq: Every day | ORAL | Status: DC | PRN
  Administered 2020-10-10: 30 mL via ORAL
  Filled 2020-10-10: qty 30

## 2020-10-10 MED ORDER — PHENOL 1.4 % MT LIQD
1.0000 | OROMUCOSAL | Status: DC | PRN
  Filled 2020-10-10: qty 177

## 2020-10-10 MED ORDER — PROPOFOL 10 MG/ML IV BOLUS
INTRAVENOUS | Status: AC
  Filled 2020-10-10: qty 20

## 2020-10-10 MED ORDER — ONDANSETRON HCL 4 MG/2ML IJ SOLN: 4.0000 mg | Freq: Once | INTRAMUSCULAR | Status: DC | PRN

## 2020-10-10 MED ORDER — OLMESARTAN-AMLODIPINE-HCTZ 40-5-25 MG PO TABS: 1.0000 | ORAL_TABLET | ORAL | Status: DC

## 2020-10-10 MED ORDER — METOCLOPRAMIDE HCL 5 MG/ML IJ SOLN
5.0000 mg | Freq: Three times a day (TID) | INTRAMUSCULAR | Status: DC | PRN
Start: 2020-10-10 — End: 2020-10-11

## 2020-10-10 MED ORDER — BUPIVACAINE HCL (PF) 0.5 % IJ SOLN
INTRAMUSCULAR | Status: DC | PRN
  Administered 2020-10-10: 3 mL

## 2020-10-10 MED ORDER — BISACODYL 10 MG RE SUPP: 10.0000 mg | Freq: Every day | RECTAL | Status: DC | PRN

## 2020-10-10 MED ORDER — FENTANYL CITRATE (PF) 100 MCG/2ML IJ SOLN
INTRAMUSCULAR | Status: DC | PRN
  Administered 2020-10-10: 50 ug via INTRAVENOUS

## 2020-10-10 MED ORDER — SODIUM CHLORIDE 0.9 % IV SOLN
INTRAVENOUS | Status: DC | PRN
  Administered 2020-10-10: 40 ug/min via INTRAVENOUS

## 2020-10-10 MED ORDER — HYDROCODONE-ACETAMINOPHEN 5-325 MG PO TABS
1.0000 | ORAL_TABLET | ORAL | Status: DC | PRN
  Administered 2020-10-10 – 2020-10-11 (×3): 2 via ORAL
  Filled 2020-10-10 (×3): qty 2

## 2020-10-10 MED ORDER — PROPOFOL 500 MG/50ML IV EMUL
INTRAVENOUS | Status: AC
  Filled 2020-10-10: qty 50

## 2020-10-10 MED ORDER — AMLODIPINE BESYLATE 5 MG PO TABS
5.0000 mg | ORAL_TABLET | Freq: Every day | ORAL | Status: DC
  Administered 2020-10-10 – 2020-10-11 (×2): 5 mg via ORAL
  Filled 2020-10-10 (×2): qty 1

## 2020-10-10 MED ORDER — DOCUSATE SODIUM 100 MG PO CAPS
100.0000 mg | ORAL_CAPSULE | Freq: Two times a day (BID) | ORAL | Status: DC
  Administered 2020-10-10 – 2020-10-11 (×2): 100 mg via ORAL
  Filled 2020-10-10 (×2): qty 1

## 2020-10-10 MED ORDER — HYDROMORPHONE HCL 1 MG/ML IJ SOLN
INTRAMUSCULAR | Status: DC | PRN
  Administered 2020-10-10: .4 mg via INTRAVENOUS
  Administered 2020-10-10: .2 mg via INTRAVENOUS

## 2020-10-10 MED ORDER — ROSUVASTATIN CALCIUM 10 MG PO TABS
10.0000 mg | ORAL_TABLET | ORAL | Status: DC
  Administered 2020-10-11: 10 mg via ORAL
  Filled 2020-10-10: qty 1

## 2020-10-10 MED ORDER — HYDROMORPHONE HCL 1 MG/ML IJ SOLN
INTRAMUSCULAR | Status: AC
  Filled 2020-10-10: qty 1

## 2020-10-10 MED ORDER — OXYCODONE HCL 5 MG PO TABS
5.0000 mg | ORAL_TABLET | Freq: Once | ORAL | Status: DC | PRN
Start: 2020-10-10 — End: 2020-10-10

## 2020-10-10 MED ORDER — CLINDAMYCIN PHOSPHATE 600 MG/50ML IV SOLN
600.0000 mg | Freq: Four times a day (QID) | INTRAVENOUS | Status: AC
  Administered 2020-10-10 (×2): 600 mg via INTRAVENOUS
  Filled 2020-10-10 (×2): qty 50

## 2020-10-10 MED ORDER — ASPIRIN 81 MG PO CHEW
81.0000 mg | CHEWABLE_TABLET | Freq: Two times a day (BID) | ORAL | Status: DC
  Administered 2020-10-10 – 2020-10-11 (×2): 81 mg via ORAL
  Filled 2020-10-10 (×2): qty 1

## 2020-10-10 SURGICAL SUPPLY — 51 items
BLADE SAGITTAL WIDE XTHICK NO (BLADE) ×2 IMPLANT
BRUSH SCRUB EZ  4% CHG (MISCELLANEOUS) ×2
BRUSH SCRUB EZ 4% CHG (MISCELLANEOUS) ×2 IMPLANT
CHLORAPREP W/TINT 26 (MISCELLANEOUS) ×2 IMPLANT
COVER HOLE (Hips) ×2 IMPLANT
COVER WAND RF STERILE (DRAPES) ×2 IMPLANT
DRAPE 3/4 80X56 (DRAPES) ×2 IMPLANT
DRAPE C-ARM 42X72 X-RAY (DRAPES) ×2 IMPLANT
DRAPE STERI IOBAN 125X83 (DRAPES) IMPLANT
DRSG AQUACEL AG ADV 3.5X10 (GAUZE/BANDAGES/DRESSINGS) ×2 IMPLANT
DRSG AQUACEL AG ADV 3.5X14 (GAUZE/BANDAGES/DRESSINGS) IMPLANT
ELECT BLADE 6.5 EXT (BLADE) ×2 IMPLANT
ELECT REM PT RETURN 9FT ADLT (ELECTROSURGICAL) ×2
ELECTRODE REM PT RTRN 9FT ADLT (ELECTROSURGICAL) ×1 IMPLANT
GAUZE XEROFORM 1X8 LF (GAUZE/BANDAGES/DRESSINGS) IMPLANT
GLOVE INDICATOR 8.0 STRL GRN (GLOVE) ×2 IMPLANT
GLOVE SURG ORTHO LTX SZ8 (GLOVE) ×4 IMPLANT
GOWN STRL REUS W/ TWL LRG LVL3 (GOWN DISPOSABLE) ×1 IMPLANT
GOWN STRL REUS W/ TWL XL LVL3 (GOWN DISPOSABLE) ×1 IMPLANT
GOWN STRL REUS W/TWL LRG LVL3 (GOWN DISPOSABLE) ×1
GOWN STRL REUS W/TWL XL LVL3 (GOWN DISPOSABLE) ×1
HEAD FEMORAL SZ21 -3 OXINIUM (Head) ×2 IMPLANT
HOOD PEEL AWAY FLYTE STAYCOOL (MISCELLANEOUS) ×6 IMPLANT
IRRIGATION SURGIPHOR STRL (IV SOLUTION) IMPLANT
IV NS 1000ML (IV SOLUTION) ×1
IV NS 1000ML BAXH (IV SOLUTION) ×1 IMPLANT
KIT PATIENT CARE HANA TABLE (KITS) ×2 IMPLANT
KIT TURNOVER CYSTO (KITS) ×2 IMPLANT
LINER 3H HEMI SHELL 48MM (Liner) ×2 IMPLANT
LINER ACETABULAR 32X48 (Liner) ×2 IMPLANT
MANIFOLD NEPTUNE II (INSTRUMENTS) ×2 IMPLANT
MAT ABSORB  FLUID 56X50 GRAY (MISCELLANEOUS) ×1
MAT ABSORB FLUID 56X50 GRAY (MISCELLANEOUS) ×1 IMPLANT
NDL SAFETY ECLIPSE 18X1.5 (NEEDLE) ×2 IMPLANT
NEEDLE HYPO 18GX1.5 SHARP (NEEDLE) ×2
NEEDLE HYPO 22GX1.5 SAFETY (NEEDLE) ×2 IMPLANT
NEEDLE SPNL 20GX3.5 QUINCKE YW (NEEDLE) ×2 IMPLANT
PACK HIP PROSTHESIS (MISCELLANEOUS) ×2 IMPLANT
PADDING CAST BLEND 4X4 NS (MISCELLANEOUS) ×4 IMPLANT
PILLOW ABDUCTION MEDIUM (MISCELLANEOUS) ×2 IMPLANT
PULSAVAC PLUS IRRIG FAN TIP (DISPOSABLE) ×2
SCREW CAN HEAD 6.5MM HIP (Screw) ×2 IMPLANT
STAPLER SKIN PROX 35W (STAPLE) ×2 IMPLANT
STEM LATERAL COLLAR LAT1 12/14 (Stem) ×2 IMPLANT
SUT BONE WAX W31G (SUTURE) ×2 IMPLANT
SUT DVC 2 QUILL PDO  T11 36X36 (SUTURE) ×1
SUT DVC 2 QUILL PDO T11 36X36 (SUTURE) ×1 IMPLANT
SUT VIC AB 2-0 CT1 18 (SUTURE) ×2 IMPLANT
SYR 20ML LL LF (SYRINGE) ×2 IMPLANT
TIP FAN IRRIG PULSAVAC PLUS (DISPOSABLE) ×1 IMPLANT
WAND WEREWOLF FASTSEAL 6.0 (MISCELLANEOUS) ×2 IMPLANT

## 2020-10-10 NOTE — H&P (Signed)
The patient has been re-examined, and the chart reviewed, and there have been no interval changes to the documented history and physical.  Plan a right total hip today.  Anesthesia is not consulted regarding a peripheral nerve block for post-operative pain.  The risks, benefits, and alternatives have been discussed at length, and the patient is willing to proceed.    

## 2020-10-10 NOTE — Progress Notes (Signed)
   10/10/20 0740  Clinical Encounter Type  Visited With Patient  Visit Type Initial;Spiritual support;Social support  Referral From Chaplain  Consult/Referral To [employer] met Ms. Bulluck while doing rounds. PT was able to express her emotions. Chaplain ministered with emotional and spiritual support.

## 2020-10-10 NOTE — Anesthesia Preprocedure Evaluation (Addendum)
Anesthesia Evaluation  Patient identified by MRN, date of birth, ID band Patient awake    Reviewed: Allergy & Precautions, H&P , NPO status , Patient's Chart, lab work & pertinent test results  History of Anesthesia Complications Negative for: history of anesthetic complications  Airway Mallampati: II  TM Distance: >3 FB Neck ROM: full    Dental  (+) Teeth Intact   Pulmonary sleep apnea , neg COPD, Current Smoker and Patient abstained from smoking.,    breath sounds clear to auscultation       Cardiovascular hypertension, (-) angina(-) Past MI and (-) Cardiac Stents (-) dysrhythmias  Rhythm:regular Rate:Normal     Neuro/Psych PSYCHIATRIC DISORDERS Anxiety negative neurological ROS     GI/Hepatic Neg liver ROS, GERD  ,  Endo/Other  negative endocrine ROS  Renal/GU      Musculoskeletal   Abdominal   Peds  Hematology negative hematology ROS (+)   Anesthesia Other Findings Past Medical History: No date: Acid reflux No date: Arthritis No date: GAD (generalized anxiety disorder) No date: Hypertension 09/08/2019: OSA (obstructive sleep apnea)     Comment:  does not have a cpap No date: Tobacco use  Past Surgical History: No date: ABDOMINAL HYSTERECTOMY     Comment:  Complete Hysterectomy No date: CHOLECYSTECTOMY 03/01/2018: GANGLION CYST EXCISION; Left     Comment:  Procedure: REMOVAL GANGLION OF WRIST;  Surgeon: Deeann Saint, MD;  Location: [facility] ORS;  Service: Orthopedics;                Laterality: Left;  BMI    Body Mass Index: 26.15 kg/m      Reproductive/Obstetrics negative OB ROS                           Anesthesia Physical Anesthesia Plan  ASA: II  Anesthesia Plan: Spinal   Post-op Pain Management:    Induction:   PONV Risk Score and Plan: Propofol infusion and Ondansetron  Airway Management Planned: Simple Face Mask  Additional Equipment:    Intra-op Plan:   Post-operative Plan:   Informed Consent: I have reviewed the patients History and Physical, chart, labs and discussed the procedure including the risks, benefits and alternatives for the proposed anesthesia with the patient or authorized representative who has indicated his/her understanding and acceptance.     Dental Advisory Given  Plan Discussed with: Anesthesiologist, CRNA and Surgeon  Anesthesia Plan Comments:         Anesthesia Quick Evaluation

## 2020-10-10 NOTE — Plan of Care (Signed)

## 2020-10-10 NOTE — Transfer of Care (Signed)
Immediate Anesthesia Transfer of Care Note  Patient: [name]  Procedure(s) Performed: TOTAL HIP ARTHROPLASTY ANTERIOR APPROACH (Right Hip)  Patient Location: PACU  Anesthesia Type:General  Level of Consciousness: awake, alert  and oriented  Airway & Oxygen Therapy: Patient Spontanous Breathing and Patient connected to face mask oxygen  Post-op Assessment: Report given to RN and Post -op Vital signs reviewed and stable  Post vital signs: Reviewed and stable  Last Vitals:  Vitals Value Taken Time  BP 129/73 10/10/20 1045  Temp 36.3 C 10/10/20 1045  Pulse 69 10/10/20 1047  Resp 16 10/10/20 1047  SpO2 100 % 10/10/20 1047  Vitals shown include unvalidated device data.  Last Pain:  Vitals:   10/10/20 0721  TempSrc: Tympanic  PainSc: 0-No pain      Patients Stated Pain Goal: 0 (10/10/20 0721)  Complications: No complications documented.

## 2020-10-10 NOTE — Anesthesia Procedure Notes (Signed)
Spinal  Patient location during procedure: OR Start time: 10/10/2020 8:40 AM Reason for block: surgical anesthesia Preanesthetic Checklist Completed: patient identified, IV checked, site marked, risks and benefits discussed, surgical consent, monitors and equipment checked, pre-op evaluation and timeout performed Spinal Block Patient position: sitting Prep: Betadine Patient monitoring: heart rate, continuous pulse ox, blood pressure and cardiac monitor Approach: midline Location: L4-5 Injection technique: single-shot Needle Needle type: Whitacre and Introducer  Needle gauge: 24 G Needle length: 9 cm Assessment Sensory level: T8 Events: CSF return Additional Notes Negative paresthesia. Negative blood return. Positive free-flowing CSF. Expiration date of kit checked and confirmed. Patient tolerated procedure well, without complications.

## 2020-10-10 NOTE — Op Note (Signed)
10/10/2020  10:44 AM  PATIENT:  [name]   MRN: [id number]  PRE-OPERATIVE DIAGNOSIS:  Osteoarthritis right hip   POST-OPERATIVE DIAGNOSIS: Same  Procedure: Right Total Hip Replacement  Surgeon: Dola Argyle. Odis Luster, MD   Assist: Altamese Cabal, PA-C  Anesthesia: Spinal   EBL: 100 mL   Specimens: None   Drains: None   Components used: A size 1 lateral Polarstem Smith and Nephew, R3 size 48 mm shell, and a 32 mm -3 mm head    Description of the procedure in detail: After informed consent was obtained and the appropriate extremity marked in the pre-operative holding area, the patient was taken to the operating room and placed in the supine position on the fracture table. All pressure points were well padded and bilateral lower extremities were place in traction spars. The hip was prepped and draped in standard sterile fashion. A spinal anesthetic had been delivered by the anesthesia team. The skin and subcutaneous tissues were injected with a mixture of Marcaine with epinephrine for post-operative pain. A longitudinal incision approximately 10 cm in length was carried out from the anterior superior iliac spine to the greater trochanter. The tensor fascia was divided and blunt dissection was taken down to the level of the joint capsule. The lateral circumflex vessels were cauterized. Deep retractors were placed and a portion of the anterior capsule was excised. Using fluoroscopy the neck cut was planned and carried out with a sagittal saw. The head was passed from the field with use of a corkscrew and hip skid. Deep retractors were placed along the acetabulum and the degenerative labrum and large osteophytes were removed with a Rongeur. The cup was sequentially reamed to a size 48 mm. The wound was irrigated and using fluoroscopy the size 48 mm cup was impacted in to anatomic position. A single screw was placed followed by a threaded hole cover. The final liner was impacted in to position.  Attention was then turned to the proximal femur. The leg was placed in extension and external rotation. The canal was opened and sequentially broached to a size 1 lateral. The trial components were placed and the hip relocated. The components were found to be in good position using fluoroscopy. The hip was dislocated and the trial components removed. The final components were impacted in to position and the hip relocated. The final components were again check with fluoroscopy and found to be in good position. Hemostasis was achieved with electrocautery. The deep capsule was injected with Marcaine and epinephrine. The wound was irrigated with bacitracin laced normal saline and the tensor fascia closed with #2 Quill suture. The subcutaneous tissues were closed with 2-0 vicryl and staples for the skin. A sterile dressing was applied and an abduction pillow. Patient tolerated the procedure well and there were no apparent complication. Patient was taken to the recovery room in good condition.   Cassell Smiles, MD

## 2020-10-10 NOTE — Evaluation (Signed)
Physical Therapy Evaluation Patient Details Name: [name] MRN: [id number] DOB: 08-24-1965 Today's Date: 10/10/2020   History of Present Illness  55 y/o female s/p R anterior approach hip replacement 3/16.  Clinical Impression  Pt did well with PT exam POD0.  She was able to get to EOB, rise to standing and ambulate ~50 ft.  She had expected pain in R anterior hip, but showed great effort with all tasks.  Pt does not have a walker at home and will need one, apparently outpt PT has been set up and per today's performance this will be an appropriate disposition.     Follow Up Recommendations Follow surgeon's recommendation for DC plan and follow-up therapies    Equipment Recommendations  Rolling walker with 5" wheels    Recommendations for Other Services       Precautions / Restrictions Precautions Precautions: Anterior Hip;Fall Restrictions Weight Bearing Restrictions: Yes RLE Weight Bearing: Weight bearing as tolerated      Mobility  Bed Mobility Overal bed mobility: Modified Independent             General bed mobility comments: Pt needed extra time and effort, light use of UEs to assist with R LE, but no direct PT assist    Transfers Overall transfer level: Modified independent Equipment used: Rolling walker (2 wheeled)             General transfer comment: Cuing for set up and sequencing, able to rise w/o directy assist  Ambulation/Gait Ambulation/Gait assistance: Supervision Gait Distance (Feet): 50 Feet Assistive device: Rolling walker (2 wheeled)       General Gait Details: Pt with some initial difficulty advancing R hip secondary to pain/stiffness but was able to increase cadence and comfort with increased distance and was ultimately able to maintain consistent walker momentum and cadence  Stairs            Wheelchair Mobility    Modified Rankin (Stroke Patients Only)       Balance Overall balance assessment: Modified  Independent                                           Pertinent Vitals/Pain Pain Assessment: 0-10 Pain Score: 4  Pain Location: incision site    Home Living Family/patient expects to be discharged to:: Private residence Living Arrangements: Spouse/significant other Available Help at Discharge: Family (husband 24/7 first few days, then family PRN) Type of Home: House Home Access: Level entry     Home Layout: Able to live on main level with bedroom/bathroom (flight to get up to bed room) Home Equipment: Bedside commode      Prior Function Level of Independence: Independent         Comments: able drive, run errands, etc w/o assist     Hand Dominance        Extremity/Trunk Assessment   Upper Extremity Assessment Upper Extremity Assessment: Overall WFL for tasks assessed    Lower Extremity Assessment Lower Extremity Assessment: Overall WFL for tasks assessed (expected post-op weakness, especially hip flexion)       Communication   Communication: No difficulties  Cognition Arousal/Alertness: Awake/alert Behavior During Therapy: WFL for tasks assessed/performed Overall Cognitive Status: Within Functional Limits for tasks assessed  General Comments      Exercises Total Joint Exercises Ankle Circles/Pumps: AROM;10 reps Quad Sets: Strengthening;10 reps Heel Slides: 10 reps;AROM (with resisted leg ext) Hip ABduction/ADduction: 10 reps;AROM Straight Leg Raises: Strengthening;10 reps   Assessment/Plan    PT Assessment Patient needs continued PT services  PT Problem List Decreased strength;Decreased range of motion;Decreased activity tolerance;Decreased balance;Decreased mobility;Decreased knowledge of use of DME;Decreased safety awareness;Decreased knowledge of precautions;Pain       PT Treatment Interventions DME instruction;Gait training;Stair training;Functional mobility  training;Therapeutic activities;Therapeutic exercise;Balance training;Neuromuscular re-education;Patient/family education    PT Goals (Current goals can be found in the Care Plan section)  Acute Rehab PT Goals Patient Stated Goal: go home PT Goal Formulation: With patient Time For Goal Achievement: 10/24/20 Potential to Achieve Goals: Good    Frequency BID   Barriers to discharge        Co-evaluation               AM-PAC PT "6 Clicks" Mobility  Outcome Measure Help needed turning from your back to your side while in a flat bed without using bedrails?: A Little Help needed moving from lying on your back to sitting on the side of a flat bed without using bedrails?: A Little Help needed moving to and from a bed to a chair (including a wheelchair)?: A Little Help needed standing up from a chair using your arms (e.g., wheelchair or bedside chair)?: A Little Help needed to walk in hospital room?: A Little Help needed climbing 3-5 steps with a railing? : A Little 6 Click Score: 18    End of Session Equipment Utilized During Treatment: Gait belt Activity Tolerance: Patient tolerated treatment well;Patient limited by pain Patient left: with chair alarm set;with call bell/phone within reach Nurse Communication: Mobility status PT Visit Diagnosis: Muscle weakness (generalized) (M62.81);Difficulty in walking, not elsewhere classified (R26.2);Pain Pain - Right/Left: Right Pain - part of body: Hip    Time: 1720-1758 PT Time Calculation (min) (ACUTE ONLY): 38 min   Charges:   PT Evaluation $PT Eval Low Complexity: 1 Low PT Treatments $Gait Training: 8-22 mins $Therapeutic Exercise: 8-22 mins        Malachi Pro, DPT 10/10/2020, 6:11 PM

## 2020-10-11 ENCOUNTER — Encounter: Payer: Self-pay | Admitting: Orthopedic Surgery

## 2020-10-11 DIAGNOSIS — M1611 Unilateral primary osteoarthritis, right hip: Secondary | ICD-10-CM | POA: Diagnosis not present

## 2020-10-11 LAB — CBC
HCT: 37.8 % (ref 36.0–46.0)
Hemoglobin: 12 g/dL (ref 12.0–15.0)
MCH: 27.3 pg (ref 26.0–34.0)
MCHC: 31.7 g/dL (ref 30.0–36.0)
MCV: 85.9 fL (ref 80.0–100.0)
Platelets: 244 10*3/uL (ref 150–400)
RBC: 4.4 MIL/uL (ref 3.87–5.11)
RDW: 13.2 % (ref 11.5–15.5)
WBC: 15.6 10*3/uL — ABNORMAL HIGH (ref 4.0–10.5)
nRBC: 0 % (ref 0.0–0.2)

## 2020-10-11 LAB — BASIC METABOLIC PANEL
Anion gap: 9 (ref 5–15)
BUN: 21 mg/dL — ABNORMAL HIGH (ref 6–20)
CO2: 27 mmol/L (ref 22–32)
Calcium: 9.6 mg/dL (ref 8.9–10.3)
Chloride: 103 mmol/L (ref 98–111)
Creatinine, Ser: 0.76 mg/dL (ref 0.44–1.00)
GFR, Estimated: >60 mL/min (ref >60)
Glucose, Bld: 179 mg/dL — ABNORMAL HIGH (ref 70–99)
Potassium: 4.2 mmol/L (ref 3.5–5.1)
Sodium: 139 mmol/L (ref 135–145)

## 2020-10-11 MED ORDER — HYDROCODONE-ACETAMINOPHEN 5-325 MG PO TABS: 1.0000 | ORAL_TABLET | ORAL | 0 refills | Status: DC | PRN

## 2020-10-11 MED ORDER — METHOCARBAMOL 500 MG PO TABS: 500.0000 mg | ORAL_TABLET | Freq: Three times a day (TID) | ORAL | 1 refills | Status: DC | PRN

## 2020-10-11 MED ORDER — ASPIRIN 81 MG PO CHEW: 81.0000 mg | CHEWABLE_TABLET | Freq: Two times a day (BID) | ORAL | 0 refills | Status: DC

## 2020-10-11 MED ORDER — DOCUSATE SODIUM 100 MG PO CAPS: 100.0000 mg | ORAL_CAPSULE | Freq: Two times a day (BID) | ORAL | 0 refills | Status: DC

## 2020-10-11 NOTE — Progress Notes (Signed)
Physical Therapy Treatment Patient Details Name: [name] MRN: [id number] DOB: April 03, 1966 Today's Date: 10/11/2020    History of Present Illness 55 y/o female s/p R anterior approach hip replacement 3/16.    PT Comments    Pt is A and O x 4 and very cooperative/pleasant. Agrees to PT session and is eager to DC home. Pt was able to demonstrate all functional activity safely. Demonstrated safe ability to perform stairs and ambulate. Pt is cleared from PT standpoint for safe DC home with outpatient PT to start in one week. Author recommended continued performance of there ex and ambulation until OPPT starts. Supportive spouse present throughout session and will be able to assist pt with needs at home. Pt was seated EOB at conclusion of sessionw ith CM in room. Recommend BSC and RW prior to DC.    Follow Up Recommendations  Outpatient PT;Follow surgeon's recommendation for DC plan and follow-up therapies     Equipment Recommendations  Rolling walker with 5" wheels;3in1 (PT)    Recommendations for Other Services       Precautions / Restrictions Precautions Precautions: Anterior Hip;Fall Precaution Booklet Issued: Yes (comment) Restrictions Weight Bearing Restrictions: Yes RLE Weight Bearing: Weight bearing as tolerated    Mobility  Bed Mobility Overal bed mobility: Modified Independent     Transfers Overall transfer level: Modified independent     Ambulation/Gait Ambulation/Gait assistance: Supervision Gait Distance (Feet): 120 Feet Assistive device: Rolling walker (2 wheeled)   Gait velocity: WNL   General Gait Details: no LOB or safety concerns with ambulation   Stairs Stairs: Yes Stairs assistance: Supervision Stair Management: One rail Right;Sideways;Step to pattern Number of Stairs: 4 General stair comments: Pt was easily and safely able to perform stairs without difficulty. demonstrated correct sequencing without physical assistance           Cognition Arousal/Alertness: Awake/alert Behavior During Therapy: WFL for tasks assessed/performed Overall Cognitive Status: Within Functional Limits for tasks assessed          General Comments: Pt is A and O x 4         General Comments General comments (skin integrity, edema, etc.): reviewed car transfers stair training hip precautions and HEP ther ex      Pertinent Vitals/Pain Pain Assessment: No/denies pain Pain Score: 0-No pain Pain Location: sore           PT Goals (current goals can now be found in the care plan section) Acute Rehab PT Goals Patient Stated Goal: go home Progress towards PT goals: Progressing toward goals    Frequency    BID      PT Plan Current plan remains appropriate       AM-PAC PT "6 Clicks" Mobility   Outcome Measure  Help needed turning from your back to your side while in a flat bed without using bedrails?: None Help needed moving from lying on your back to sitting on the side of a flat bed without using bedrails?: None Help needed moving to and from a bed to a chair (including a wheelchair)?: A Little Help needed standing up from a chair using your arms (e.g., wheelchair or bedside chair)?: A Little Help needed to walk in hospital room?: A Little Help needed climbing 3-5 steps with a railing? : A Little 6 Click Score: 20    End of Session Equipment Utilized During Treatment: Gait belt Activity Tolerance: Patient tolerated treatment well;Patient limited by pain Patient left: in bed;with call bell/phone within reach;with family/visitor present  Nurse Communication: Mobility status PT Visit Diagnosis: Muscle weakness (generalized) (M62.81);Difficulty in walking, not elsewhere classified (R26.2);Pain Pain - Right/Left: Right Pain - part of body: Hip     Time: 0836-0900 PT Time Calculation (min) (ACUTE ONLY): 24 min  Charges:  $Gait Training: 23-37 mins                     Jetta Lout PTA 10/11/20, 9:07 AM

## 2020-10-11 NOTE — Discharge Instructions (Signed)

## 2020-10-11 NOTE — Progress Notes (Signed)
  Subjective:  Patient reports pain as mild.    Objective:   VITALS:   Vitals:   10/10/20 1349 10/10/20 1511 10/10/20 1959 10/11/20 0353  BP: (!) 147/74 (!) 161/85 (!) 181/94 (!) 150/82  Pulse: 81 76 81 75  Resp: $Remo'18 18 18 18  'JDcYB$ Temp: 98.1 F (36.7 C) 98 F (36.7 C) 98.1 F (36.7 C) 98.4 F (36.9 C)  TempSrc: Oral     SpO2: 100% 100% 100% 98%  Weight:      Height: $Remove'5\' 8"'BxEfWit$  (1.727 m)       PHYSICAL EXAM:  Neurologically intact ABD soft Neurovascular intact Sensation intact distally Intact pulses distally Dorsiflexion/Plantar flexion intact Incision: dressing C/D/I No cellulitis present Compartment soft  LABS  Results for orders placed or performed during the hospital encounter of 10/10/20 (from the past 24 hour(s))  ABO/Rh     Status: None   Collection Time: 10/10/20  7:21 AM  Result Value Ref Range   ABO/RH(D)      O NEG Performed at [facility], [address] [place], [place] [address]   CBC     Status: Abnormal   Collection Time: 10/11/20  5:55 AM  Result Value Ref Range   WBC 15.6 (H) 4.0 - 10.5 K/uL   RBC 4.40 3.87 - 5.11 MIL/uL   Hemoglobin 12.0 12.0 - 15.0 g/dL   HCT 37.8 36.0 - 46.0 %   MCV 85.9 80.0 - 100.0 fL   MCH 27.3 26.0 - 34.0 pg   MCHC 31.7 30.0 - 36.0 g/dL   RDW 13.2 11.5 - 15.5 %   Platelets 244 150 - 400 K/uL   nRBC 0.0 0.0 - 0.2 %  Basic metabolic panel     Status: Abnormal   Collection Time: 10/11/20  5:55 AM  Result Value Ref Range   Sodium 139 135 - 145 mmol/L   Potassium 4.2 3.5 - 5.1 mmol/L   Chloride 103 98 - 111 mmol/L   CO2 27 22 - 32 mmol/L   Glucose, Bld 179 (H) 70 - 99 mg/dL   BUN 21 (H) 6 - 20 mg/dL   Creatinine, Ser 0.76 0.44 - 1.00 mg/dL   Calcium 9.6 8.9 - 10.3 mg/dL   GFR, Estimated >60 >60 mL/min   Anion gap 9 5 - 15    DG HIP OPERATIVE UNILAT W OR W/O PELVIS RIGHT  Result Date: 10/10/2020 CLINICAL DATA:  Right hip replacement EXAM: OPERATIVE RIGHT HIP (WITH PELVIS IF PERFORMED) 1 VIEW TECHNIQUE:  Fluoroscopic spot image(s) were submitted for interpretation post-operatively. COMPARISON:  None. FINDINGS: Single image demonstrates right total hip arthroplasty. Single acetabular screw is present. No evidence of complication on this image. IMPRESSION: Right total hip arthroplasty. Electronically Signed   By: Macy Mis M.D.   On: 10/10/2020 11:09    Assessment/Plan: 1 Day Post-Op   Active Problems:   History of total hip replacement, right   Advance diet Up with therapy  D/C home today if PT goals met   Carlynn Spry , PA-C 10/11/2020, 7:07 AM

## 2020-10-11 NOTE — Progress Notes (Signed)
Met with the patient to discuss DC plan and needs She lives at home with Family She needs a RW and a 3 in 1 CM contacted Mardene Celeste with [employer] and arranged for DME to be brought into the room this morning She is set up with Outpatient PT and has an appointment Next week She is ready to go home and has family here once DME is delivered

## 2020-10-11 NOTE — Anesthesia Postprocedure Evaluation (Signed)
Anesthesia Post Note  Patient: [name]  Procedure(s) Performed: TOTAL HIP ARTHROPLASTY ANTERIOR APPROACH (Right Hip)  Patient location during evaluation: Nursing Unit Anesthesia Type: Spinal Level of consciousness: oriented and awake and alert Pain management: pain level controlled Vital Signs Assessment: post-procedure vital signs reviewed and stable Respiratory status: spontaneous breathing and respiratory function stable Cardiovascular status: blood pressure returned to baseline and stable Postop Assessment: no headache, no backache, no apparent nausea or vomiting and patient able to bend at knees Anesthetic complications: no   No complications documented.   Last Vitals:  Vitals:   10/11/20 0353 10/11/20 0804  BP: (!) 150/82 (!) 180/93  Pulse: 75 75  Resp: 18 18  Temp: 36.9 C 36.7 C  SpO2: 98% 100%    Last Pain:  Vitals:   10/11/20 0804  TempSrc: Oral  PainSc:                  [name]

## 2020-10-11 NOTE — Discharge Summary (Signed)
Physician Discharge Summary  Patient ID: [name] MRN: [id number] DOB/AGE: [birth year] 55 y.o.  Admit date: 10/10/2020 Discharge date: 10/11/2020  Admission Diagnoses:  Unilateral primary osteoarthritis, right hip <principal problem not specified>  Discharge Diagnoses:  Unilateral primary osteoarthritis, right hip Active Problems:   History of total hip replacement, right   Past Medical History:  Diagnosis Date  . Acid reflux   . Arthritis   . GAD (generalized anxiety disorder)   . Hypertension   . OSA (obstructive sleep apnea) 09/08/2019   does not have a cpap  . Tobacco use     Surgeries: Procedure(s): TOTAL HIP ARTHROPLASTY ANTERIOR APPROACH on 10/10/2020   Consultants (if any):   Discharged Condition: Improved  Hospital Course: [name] is an 55 y.o. female who was admitted 10/10/2020 with a diagnosis of  Unilateral primary osteoarthritis, right hip <principal problem not specified> and went to the operating room on 10/10/2020 and underwent the above named procedures.    She was given perioperative antibiotics:  Anti-infectives (From admission, onward)   Start     Dose/Rate Route Frequency Ordered Stop   10/10/20 1415  clindamycin (CLEOCIN) IVPB 600 mg        600 mg 100 mL/hr over 30 Minutes Intravenous Every 6 hours 10/10/20 1318 10/11/20 0154   10/10/20 0707  clindamycin (CLEOCIN) 900 MG/50ML IVPB       Note to Pharmacy: Kerman Passey, Cryst: cabinet override      10/10/20 0707 10/10/20 0901   10/10/20 0600  clindamycin (CLEOCIN) IVPB 900 mg        900 mg 100 mL/hr over 30 Minutes Intravenous On call to O.R. 10/09/20 2327 10/10/20 3532    .  She was given sequential compression devices, early ambulation, and aspirin for DVT prophylaxis.  She benefited maximally from the hospital stay and there were no complications.    Recent vital signs:  Vitals:   10/10/20 1959 10/11/20 0353  BP: (!) 181/94 (!) 150/82  Pulse: 81 75  Resp: 18 18  Temp: 98.1  F (36.7 C) 98.4 F (36.9 C)  SpO2: 100% 98%    Recent laboratory studies:  Lab Results  Component Value Date   HGB 12.0 10/11/2020   HGB 13.2 09/28/2020   HGB 14.0 04/10/2020   Lab Results  Component Value Date   WBC 15.6 (H) 10/11/2020   PLT 244 10/11/2020   Lab Results  Component Value Date   INR 1.0 09/28/2020   Lab Results  Component Value Date   NA 139 10/11/2020   K 4.2 10/11/2020   CL 103 10/11/2020   CO2 27 10/11/2020   BUN 21 (H) 10/11/2020   CREATININE 0.76 10/11/2020   GLUCOSE 179 (H) 10/11/2020    Discharge Medications:   Allergies as of 10/11/2020      Reactions   Penicillins Itching, Swelling   Has patient had a PCN reaction causing immediate rash, facial/tongue/throat swelling, SOB or lightheadedness with hypotension: No Has patient had a PCN reaction causing severe rash involving mucus membranes or skin necrosis: No Has patient had a PCN reaction that required hospitalization: No Has patient had a PCN reaction occurring within the last 10 years: No If all of the above answers are "NO", then may proceed with Cephalosporin use.      Medication List    STOP taking these medications   aspirin EC 81 MG tablet Replaced by: aspirin 81 MG chewable tablet     TAKE these medications   aspirin  81 MG chewable tablet Chew 1 tablet (81 mg total) by mouth 2 (two) times daily. Replaces: aspirin EC 81 MG tablet   busPIRone 5 MG tablet Commonly known as: BUSPAR Take 1 tablet (5 mg total) by mouth 3 (three) times daily as needed.   docusate sodium 100 MG capsule Commonly known as: COLACE Take 1 capsule (100 mg total) by mouth 2 (two) times daily.   fluticasone 50 MCG/ACT nasal spray Commonly known as: FLONASE Place 2 sprays into both nostrils daily as needed for allergies.   HYDROcodone-acetaminophen 5-325 MG tablet Commonly known as: NORCO/VICODIN Take 1 tablet by mouth every 4 (four) hours as needed for moderate pain (pain score 4-6).    methocarbamol 500 MG tablet Commonly known as: Robaxin Take 1 tablet (500 mg total) by mouth every 8 (eight) hours as needed for muscle spasms.   NovoFine 32G X 6 MM Misc Generic drug: Insulin Pen Needle 1 each by Does not apply route daily.   Olmesartan-amLODIPine-HCTZ 40-5-25 MG Tabs Take 1 tablet by mouth every morning.   omeprazole 40 MG capsule Commonly known as: PRILOSEC Take 1 capsule (40 mg total) by mouth daily as needed (heartburn).   rosuvastatin 10 MG tablet Commonly known as: CRESTOR Take 1 tablet (10 mg total) by mouth every morning.   Saxenda 18 MG/3ML Sopn Generic drug: Liraglutide -Weight Management Inject 3 mg into the skin daily.            Durable Medical Equipment  (From admission, onward)         Start     Ordered   10/11/20 0711  For home use only DME 3 n 1  Once        10/11/20 0711   10/11/20 0711  For home use only DME Walker rolling  Once       Question Answer Comment  Walker: With 5 Inch Wheels   Patient needs a walker to treat with the following condition Osteoarthritis of right hip      10/11/20 0711   10/10/20 1319  DME Walker rolling  Once       Question:  Patient needs a walker to treat with the following condition  Answer:  History of total hip replacement, right   10/10/20 1318   10/10/20 1319  DME 3 n 1  Once        10/10/20 1318   10/10/20 1319  DME Bedside commode  Once       Question:  Patient needs a bedside commode to treat with the following condition  Answer:  History of total hip replacement, right   10/10/20 1318          Diagnostic Studies: DG HIP OPERATIVE UNILAT W OR W/O PELVIS RIGHT  Result Date: 10/10/2020 CLINICAL DATA:  Right hip replacement EXAM: OPERATIVE RIGHT HIP (WITH PELVIS IF PERFORMED) 1 VIEW TECHNIQUE: Fluoroscopic spot image(s) were submitted for interpretation post-operatively. COMPARISON:  None. FINDINGS: Single image demonstrates right total hip arthroplasty. Single acetabular screw is present.  No evidence of complication on this image. IMPRESSION: Right total hip arthroplasty. Electronically Signed   By: Guadlupe Spanish M.D.   On: 10/10/2020 11:09    Disposition: Discharge disposition: 01-Home or Self Care            Signed: Altamese Cabal ,PA-C 10/11/2020, 7:11 AM

## 2020-10-12 LAB — SURGICAL PATHOLOGY

## 2020-10-15 ENCOUNTER — Emergency Department
Admission: EM | Admit: 2020-10-15 | Discharge: 2020-10-15 | Disposition: A | Discharge status: Home / Self Care | Payer: Managed Care, Other (non HMO) | Attending: Emergency Medicine | Admitting: Emergency Medicine

## 2020-10-15 ENCOUNTER — Other Ambulatory Visit: Payer: Self-pay

## 2020-10-15 ENCOUNTER — Emergency Department: Payer: Managed Care, Other (non HMO)

## 2020-10-15 DIAGNOSIS — Z96641 Presence of right artificial hip joint: Secondary | ICD-10-CM | POA: Diagnosis not present

## 2020-10-15 DIAGNOSIS — F17210 Nicotine dependence, cigarettes, uncomplicated: Secondary | ICD-10-CM | POA: Insufficient documentation

## 2020-10-15 DIAGNOSIS — Z7984 Long term (current) use of oral hypoglycemic drugs: Secondary | ICD-10-CM | POA: Diagnosis not present

## 2020-10-15 DIAGNOSIS — S71011D Laceration without foreign body, right hip, subsequent encounter: Secondary | ICD-10-CM | POA: Insufficient documentation

## 2020-10-15 DIAGNOSIS — L7682 Other postprocedural complications of skin and subcutaneous tissue: Secondary | ICD-10-CM | POA: Diagnosis not present

## 2020-10-15 DIAGNOSIS — I10 Essential (primary) hypertension: Secondary | ICD-10-CM | POA: Diagnosis not present

## 2020-10-15 DIAGNOSIS — Z7982 Long term (current) use of aspirin: Secondary | ICD-10-CM | POA: Insufficient documentation

## 2020-10-15 DIAGNOSIS — G8918 Other acute postprocedural pain: Secondary | ICD-10-CM | POA: Diagnosis not present

## 2020-10-15 DIAGNOSIS — X58XXXD Exposure to other specified factors, subsequent encounter: Secondary | ICD-10-CM | POA: Diagnosis not present

## 2020-10-15 DIAGNOSIS — Z794 Long term (current) use of insulin: Secondary | ICD-10-CM | POA: Insufficient documentation

## 2020-10-15 LAB — URINALYSIS, COMPLETE (UACMP) WITH MICROSCOPIC
Bacteria, UA: NONE SEEN
Bilirubin Urine: NEGATIVE
Glucose, UA: NEGATIVE mg/dL
Hgb urine dipstick: NEGATIVE
Ketones, ur: NEGATIVE mg/dL
Leukocytes,Ua: NEGATIVE
Nitrite: NEGATIVE
Protein, ur: NEGATIVE mg/dL
Specific Gravity, Urine: 1.023 (ref 1.005–1.030)
pH: 7 (ref 5.0–8.0)

## 2020-10-15 LAB — CBC WITH DIFFERENTIAL/PLATELET
Abs Immature Granulocytes: 0.03 10*3/uL (ref 0.00–0.07)
Basophils Absolute: 0 10*3/uL (ref 0.0–0.1)
Basophils Relative: 0 %
Eosinophils Absolute: 0.1 10*3/uL (ref 0.0–0.5)
Eosinophils Relative: 1 %
HCT: 34.2 % — ABNORMAL LOW (ref 36.0–46.0)
Hemoglobin: 10.6 g/dL — ABNORMAL LOW (ref 12.0–15.0)
Immature Granulocytes: 0 %
Lymphocytes Relative: 40 %
Lymphs Abs: 3.7 10*3/uL (ref 0.7–4.0)
MCH: 26.7 pg (ref 26.0–34.0)
MCHC: 31 g/dL (ref 30.0–36.0)
MCV: 86.1 fL (ref 80.0–100.0)
Monocytes Absolute: 0.6 10*3/uL (ref 0.1–1.0)
Monocytes Relative: 7 %
Neutro Abs: 4.7 10*3/uL (ref 1.7–7.7)
Neutrophils Relative %: 52 %
Platelets: 363 10*3/uL (ref 150–400)
RBC: 3.97 MIL/uL (ref 3.87–5.11)
RDW: 13.3 % (ref 11.5–15.5)
WBC: 9.2 10*3/uL (ref 4.0–10.5)
nRBC: 0 % (ref 0.0–0.2)

## 2020-10-15 LAB — COMPREHENSIVE METABOLIC PANEL
ALT: 64 U/L — ABNORMAL HIGH (ref 0–44)
AST: 36 U/L (ref 15–41)
Albumin: 3.7 g/dL (ref 3.5–5.0)
Alkaline Phosphatase: 66 U/L (ref 38–126)
Anion gap: 9 (ref 5–15)
BUN: 23 mg/dL — ABNORMAL HIGH (ref 6–20)
CO2: 26 mmol/L (ref 22–32)
Calcium: 9.4 mg/dL (ref 8.9–10.3)
Chloride: 104 mmol/L (ref 98–111)
Creatinine, Ser: 0.76 mg/dL (ref 0.44–1.00)
GFR, Estimated: >60 mL/min (ref >60)
Glucose, Bld: 108 mg/dL — ABNORMAL HIGH (ref 70–99)
Potassium: 3.9 mmol/L (ref 3.5–5.1)
Sodium: 139 mmol/L (ref 135–145)
Total Bilirubin: 0.5 mg/dL (ref 0.3–1.2)
Total Protein: 7.7 g/dL (ref 6.5–8.1)

## 2020-10-15 LAB — LACTIC ACID, PLASMA: Lactic Acid, Venous: 0.9 mmol/L (ref 0.5–1.9)

## 2020-10-15 LAB — SEDIMENTATION RATE: Sed Rate: 75 mm/hr — ABNORMAL HIGH (ref 0–30)

## 2020-10-15 MED ORDER — OXYCODONE HCL 5 MG PO TABS
5.0000 mg | ORAL_TABLET | Freq: Once | ORAL | Status: AC
Start: 2020-10-15 — End: 2020-10-15
  Administered 2020-10-15: 5 mg via ORAL
  Filled 2020-10-15: qty 1

## 2020-10-15 NOTE — Discharge Instructions (Addendum)
Follow-up with Dr. Odis Luster tomorrow.  At this time there is no signs of a severe infection given your afebrile with normal white count and your DVT ultrasound was negative but you need to be seen by him tomorrow to make sure there is no additional studies he would want.

## 2020-10-15 NOTE — ED Provider Notes (Addendum)
[facility] Emergency Department Provider Note  ____________________________________________   Event Date/Time   First MD Initiated Contact with Patient 10/15/20 1606     (approximate)  I have reviewed the triage vital signs and the nursing notes.   HISTORY  Chief Complaint Hip Pain    HPI [name] is a 55 y.o. female with hypertension who comes in for hip pain.  She presents today for redness and warmth to the right hip. Over the weekend developed pain to the right hip. There was also some warmth noted.  There was adhesions over the scar that had adhered to her skin folds. The pain is constant, worse with movement, better at rest and with hydrocodone. NO issues with BMs. No dysuria.   Denies fever, SOB.   On review of records patient had surgery on 3/16 by Dr. Odis Luster [facility] for a total hip arthroplasty.  She was discharged on 3/17.    Past Medical History:  Diagnosis Date  . Acid reflux   . Arthritis   . GAD (generalized anxiety disorder)   . Hypertension   . OSA (obstructive sleep apnea) 09/08/2019   does not have a cpap  . Tobacco use     Patient Active Problem List   Diagnosis Date Noted  . History of total hip replacement, right 10/10/2020  . OSA (obstructive sleep apnea) 09/08/2019  . Ganglion cyst of dorsum of left wrist 02/05/2018  . LVH (left ventricular hypertrophy) due to hypertensive disease, without heart failure 09/23/2017  . SOBOE (shortness of breath on exertion) 09/23/2017  . Hyperlipidemia, mixed 08/28/2017  . Hyperglycemia 08/25/2017  . Hypercalcemia due to thiazide and vitamin A 08/25/2017  . Hypertension 08/18/2017  . GAD (generalized anxiety disorder) 08/18/2017  . GERD (gastroesophageal reflux disease) 08/18/2017  . Panic attack 08/18/2017  . Tobacco use 08/18/2017  . Systolic ejection murmur 08/18/2017    Past Surgical History:  Procedure Laterality Date  . ABDOMINAL HYSTERECTOMY     Complete  Hysterectomy  . CHOLECYSTECTOMY    . GANGLION CYST EXCISION Left 03/01/2018   Procedure: REMOVAL GANGLION OF WRIST;  Surgeon: Deeann Saint, MD;  Location: [facility] ORS;  Service: Orthopedics;  Laterality: Left;  . TOTAL HIP ARTHROPLASTY Right 10/10/2020   Procedure: TOTAL HIP ARTHROPLASTY ANTERIOR APPROACH;  Surgeon: Lyndle Herrlich, MD;  Location: [facility] ORS;  Service: Orthopedics;  Laterality: Right;    Prior to Admission medications   Medication Sig Start Date End Date Taking? Authorizing Provider  aspirin 81 MG chewable tablet Chew 1 tablet (81 mg total) by mouth 2 (two) times daily. 10/11/20   Altamese Cabal, PA-C  busPIRone (BUSPAR) 5 MG tablet Take 1 tablet (5 mg total) by mouth 3 (three) times daily as needed. 10/08/20   Alba Cory, MD  docusate sodium (COLACE) 100 MG capsule Take 1 capsule (100 mg total) by mouth 2 (two) times daily. 10/11/20   Altamese Cabal, PA-C  fluticasone [facility]) 50 MCG/ACT nasal spray Place 2 sprays into both nostrils daily as needed for allergies. 10/08/20   Alba Cory, MD  HYDROcodone-acetaminophen (NORCO/VICODIN) 5-325 MG tablet Take 1 tablet by mouth every 4 (four) hours as needed for moderate pain (pain score 4-6). 10/11/20   Altamese Cabal, PA-C  Insulin Pen Needle (NOVOFINE) 32G X 6 MM MISC 1 each by Does not apply route daily. 01/09/20   Alba Cory, MD  Liraglutide -Weight Management (SAXENDA) 18 MG/3ML SOPN Inject 3 mg into the skin daily. 10/08/20   Sowles, Danna Hefty,  MD  methocarbamol (ROBAXIN) 500 MG tablet Take 1 tablet (500 mg total) by mouth every 8 (eight) hours as needed for muscle spasms. 10/11/20   Altamese Cabal, PA-C  Olmesartan-amLODIPine-HCTZ 40-5-25 MG TABS Take 1 tablet by mouth every morning. Patient not taking: Reported on 10/10/2020 10/08/20   Alba Cory, MD  omeprazole (PRILOSEC) 40 MG capsule Take 1 capsule (40 mg total) by mouth daily as needed (heartburn). 10/08/20   Alba Cory, MD  rosuvastatin (CRESTOR) 10 MG tablet Take 1  tablet (10 mg total) by mouth every morning. 10/08/20   Alba Cory, MD    Allergies Penicillins  Family History  Problem Relation Age of Onset  . Hypertension Mother   . Stroke Father   . Hypertension Father   . Hypertension Sister   . Breast cancer Neg Hx     Social History Social History   Tobacco Use  . Smoking status: Current Every Day Smoker    Packs/day: 0.50    Years: 18.00    Pack years: 9.00    Types: Cigarettes    Start date: 08/19/1999  . Smokeless tobacco: Never Used  Vaping Use  . Vaping Use: Never used  Substance Use Topics  . Alcohol use: Yes    Alcohol/week: 1.0 standard drink    Types: 1 Glasses of wine per week    Comment: rare on Saturdays   . Drug use: No      Review of Systems Constitutional: No fever/chills Eyes: No visual changes. ENT: No sore throat. Cardiovascular: Denies chest pain. Respiratory: Denies shortness of breath. Gastrointestinal: No abdominal pain.  No nausea, no vomiting.  No diarrhea.  No constipation. Genitourinary: Negative for dysuria. Musculoskeletal: Negative for back pain. R hip pain  Skin: Negative for rash. Neurological: Negative for headaches, focal weakness or numbness. All other ROS negative ____________________________________________   PHYSICAL EXAM:  VITAL SIGNS: ED Triage Vitals  Enc Vitals Group     BP 10/15/20 1453 (!) 177/82     Pulse Rate 10/15/20 1453 (!) 102     Resp 10/15/20 1453 20     Temp 10/15/20 1453 98.7 F (37.1 C)     Temp Source 10/15/20 1453 Oral     SpO2 10/15/20 1453 99 %     Weight --      Height --      Head Circumference --      Peak Flow --      Pain Score 10/15/20 1446 7     Pain Loc --      Pain Edu? --      Excl. in GC? --     Constitutional: Alert and oriented. Well appearing and in no acute distress. Eyes: Conjunctivae are normal. EOMI. Head: Atraumatic. Nose: No congestion/rhinnorhea. Mouth/Throat: Mucous membranes are moist.   Neck: No stridor. Trachea  Midline. FROM Cardiovascular: Normal rate, regular rhythm. Grossly normal heart sounds.  Good peripheral circulation. Respiratory: Normal respiratory effort.  No retractions. Lungs CTAB. Gastrointestinal: Soft and nontender. No distention. No abdominal bruits.  Musculoskeletal: Dressing noted over the Right hip scar. The dressing does well up into her inguinal fold.  There is no obvious redness and the temperature feels the same as the other side at this time.  She does report some pain in the thigh as well as minimal pain in the calf.  2+ distal pulse. Unable to lift R leg up off bed but that has been since surgery.   We briefly reviewed the dressing and her staples  are intact without any redness or erythema.  She did have a 1 cm very superficial laceration where it looks like the dressing was pinching her skin.  We did cut down the dressing slightly to prevent pinching.  There is a little bit of fullness in the thigh with some maybe mild warmth compared to the other side.  Neurologic:  Normal speech and language. No gross focal neurologic deficits are appreciated.  Skin:  Skin is warm, dry and intact. No rash noted. Psychiatric: Mood and affect are normal. Speech and behavior are normal. GU: Deferred   ____________________________________________   LABS (all labs ordered are listed, but only abnormal results are displayed)  Labs Reviewed  COMPREHENSIVE METABOLIC PANEL - Abnormal; Notable for the following components:      Result Value   Glucose, Bld 108 (*)    BUN 23 (*)    ALT 64 (*)    All other components within normal limits  CBC WITH DIFFERENTIAL/PLATELET - Abnormal; Notable for the following components:   Hemoglobin 10.6 (*)    HCT 34.2 (*)    All other components within normal limits  URINALYSIS, COMPLETE (UACMP) WITH MICROSCOPIC - Abnormal; Notable for the following components:   Color, Urine YELLOW (*)    APPearance CLOUDY (*)    All other components within normal limits   CULTURE, BLOOD (ROUTINE X 2)  CULTURE, BLOOD (ROUTINE X 2) W REFLEX TO ID PANEL  LACTIC ACID, PLASMA  LACTIC ACID, PLASMA  CBC  SEDIMENTATION RATE   ____________________________________________   RADIOLOGY   Official radiology report(s): US Venous Img Lower Unilateral Right  Result Date: 10/15/2020 CLINICAL DATA:  Swelling EXAM: Right LOWER EXTREMITY VENOUS DOPPLER ULTRASOUND TECHNIQUE: Gray-scale sonography with compression, as well as color and duplex ultrasound, were performed to evaluate the deep venous system(s) from the level of the common femoral vein through the popliteal and proximal calf veins. COMPARISON:  None. FINDINGS: VENOUS Normal compressibility of the common femoral, superficial femoral, and popliteal veins, as well as the visualized calf veins. Visualized portions of profunda femoral vein and great saphenous vein unremarkable. No filling defects to suggest DVT on grayscale or color Doppler imaging. Doppler waveforms show normal direction of venous flow, normal respiratory plasticity and response to augmentation. Limited views of the contralateral common femoral vein are unremarkable. OTHER None. Limitations: none IMPRESSION: Negative. Electronically Signed   By: Jonna ClarkBindu  Avutu M.D.   On: 10/15/2020 18:14    ____________________________________________   PROCEDURES  Procedure(s) performed (including Critical Care):  Procedures   ____________________________________________   INITIAL IMPRESSION / ASSESSMENT AND PLAN / ED COURSE  Reather ConverseJuliet B Tayag was evaluated in Emergency Department on 10/15/2020 for the symptoms described in the history of present illness. She was evaluated in the context of the global COVID-19 pandemic, which necessitated consideration that the patient might be at risk for infection with the SARS-CoV-2 virus that causes COVID-19. Institutional protocols and algorithms that pertain to the evaluation of patients at risk for COVID-19 are in a state of  rapid change based on information released by regulatory bodies including the CDC and federal and state organizations. These policies and algorithms were followed during the patient's care in the ED.    Patient is a well-appearing 55 year old who comes in slightly tachycardic but just secondary to pain with postop right hip pain. Consider cellulitis vs septic joint vs irritation to adhesion.  Consider DVT given some pain in the calf as well and will get DVT ultrasound. Good distal pulse unlikely  vascular issue. No falls/did not feel a pop, can still ambulate with walker to need xray.   Labs are reassuring.  White count is normal.  Lactate is normal.  Patient given some oxycodone for pain  Patient's wound does not seem to have any cellulitis on it.  It may be still slightly warm but nothing at this significantly different than the other side.  She does not appear septic and is afebrile with normal white count.  Sed rate slightly elevated but could just be postop and at this time I have lower suspicion for infection.  Patient already has follow-up with Dr. Odis Luster tomorrow.  Did alert Dr. Martha Clan who is on-call today and he stated that they will just follow-up with her tomorrow.  At this time I feel like it safe for her to be seen by him tomorrow given the wound overall looks very well  I discussed the provisional nature of ED diagnosis, the treatment so far, the ongoing plan of care, follow up appointments and return precautions with the patient and any family or support people present. They expressed understanding and agreed with the plan, discharged home.     ____________________________________________   FINAL CLINICAL IMPRESSION(S) / ED DIAGNOSES   Final diagnoses:  Post-op pain      MEDICATIONS GIVEN DURING THIS VISIT:  Medications  oxyCODONE (Oxy IR/ROXICODONE) immediate release tablet 5 mg (5 mg Oral Given 10/15/20 1657)     ED Discharge Orders    None       Note:   This document was prepared using Dragon voice recognition software and may include unintentional dictation errors.   Concha Se, MD 10/15/20 1931    Concha Se, MD 10/15/20 1933    Concha Se, MD 10/15/20 [phone]

## 2020-10-15 NOTE — ED Notes (Signed)
Pt received discharge instructions and follow up care. Pt denies any questions at this time.  VSS.  Pt left department with all belongings.

## 2020-10-15 NOTE — ED Notes (Signed)
US at bedside

## 2020-10-15 NOTE — ED Triage Notes (Signed)
Pt comes with c/o right hip pain. Pt states she just had hip surgery to her right hip. Pt states she noticed yesterday the redness and feels that it is warm, red and now swollen.  Pt states bandage still in place. Pt denies any fevers.

## 2020-10-17 ENCOUNTER — Encounter: Payer: Self-pay | Admitting: Family Medicine

## 2020-10-20 LAB — CULTURE, BLOOD (ROUTINE X 2)
Culture: NO GROWTH
Special Requests: ADEQUATE

## 2020-10-22 ENCOUNTER — Encounter: Payer: Self-pay | Admitting: Family Medicine

## 2020-10-26 ENCOUNTER — Telehealth: Payer: Self-pay | Admitting: Family Medicine

## 2020-10-26 ENCOUNTER — Telehealth (INDEPENDENT_AMBULATORY_CARE_PROVIDER_SITE_OTHER): Payer: Managed Care, Other (non HMO) | Admitting: Physician Assistant

## 2020-10-26 DIAGNOSIS — Z5329 Procedure and treatment not carried out because of patient's decision for other reasons: Secondary | ICD-10-CM

## 2020-10-26 NOTE — Telephone Encounter (Signed)
Erica with [facility] will fax over PA for saxenda she is needing more infomation

## 2020-10-30 NOTE — Progress Notes (Signed)
Patient cancelled appointment.

## 2020-11-27 ENCOUNTER — Other Ambulatory Visit: Payer: Self-pay | Admitting: Family Medicine

## 2020-11-27 DIAGNOSIS — F411 Generalized anxiety disorder: Secondary | ICD-10-CM

## 2020-11-27 NOTE — Telephone Encounter (Signed)
Pt last appt was 10-08-2020 and next appt is 02-08-2021

## 2020-11-27 NOTE — Telephone Encounter (Signed)
Requested medication (s) are due for refill today: no  Requested medication (s) are on the active medication list:yes  Last refill: 10/08/2020  Future visit scheduled: yes  Notes to clinic: review for continued use and refill    Requested Prescriptions  Pending Prescriptions Disp Refills   busPIRone (BUSPAR) 5 MG tablet [Pharmacy Med Name: BUSPIRONE 5MG  TABLETS] 30 tablet 0    Sig: TAKE 1 TABLET(5 MG) BY MOUTH THREE TIMES DAILY AS NEEDED      Psychiatry: Anxiolytics/Hypnotics - Non-controlled Passed - 11/27/2020  8:43 AM      Passed - Valid encounter within last 6 months    Recent Outpatient Visits           1 month ago No-show for appointment   [facility] M, M   1 month ago Gastroesophageal reflux disease without esophagitis   [facility], MD   4 months ago Right thigh pain   [facility], MD   5 months ago Right thigh pain   [facility], MD   7 months ago Benign essential hypertension   [facility], MD       Future Appointments             In 2 months Alba Cory, Carlynn Purl, MD [facility], [facility]

## 2020-12-19 ENCOUNTER — Other Ambulatory Visit: Payer: Self-pay | Admitting: Family Medicine

## 2020-12-19 DIAGNOSIS — Z1231 Encounter for screening mammogram for malignant neoplasm of breast: Secondary | ICD-10-CM

## 2020-12-21 ENCOUNTER — Other Ambulatory Visit: Payer: Self-pay

## 2020-12-21 ENCOUNTER — Ambulatory Visit
Admission: RE | Admit: 2020-12-21 | Discharge: 2020-12-21 | Disposition: A | Discharge status: Home / Self Care | Payer: Managed Care, Other (non HMO) | Source: Ambulatory Visit | Attending: Family Medicine | Admitting: Family Medicine

## 2020-12-21 DIAGNOSIS — Z1231 Encounter for screening mammogram for malignant neoplasm of breast: Secondary | ICD-10-CM | POA: Insufficient documentation

## 2021-01-03 ENCOUNTER — Other Ambulatory Visit: Payer: Self-pay | Admitting: Family Medicine

## 2021-01-03 DIAGNOSIS — F411 Generalized anxiety disorder: Secondary | ICD-10-CM

## 2021-02-01 ENCOUNTER — Other Ambulatory Visit: Payer: Self-pay | Admitting: Family Medicine

## 2021-02-01 DIAGNOSIS — E669 Obesity, unspecified: Secondary | ICD-10-CM

## 2021-02-01 NOTE — Telephone Encounter (Signed)
Last seen on 10-08-2020 and next appt is 02-08-2021

## 2021-02-01 NOTE — Telephone Encounter (Signed)
   Notes to clinic:  Patient has upcoming appt on 02/08/2021 Review for 90 day    Requested Prescriptions  Pending Prescriptions Disp Refills   SAXENDA 18 MG/3ML SOPN [Pharmacy Med Name: SAXENDA 6MG /ML PEN INJ 3ML] 15 mL 2    Sig: INJECT 3 MG UNDER THE SKIN DAILY      Endocrinology:  Diabetes - GLP-1 Receptor Agonists Failed - 02/01/2021 10:09 AM      Failed - HBA1C is between 0 and 7.9 and within 180 days    Hgb A1c MFr Bld  Date Value Ref Range Status  04/10/2020 6.3 (H) 4.8 - 5.6 % Final    Comment:             Prediabetes: 5.7 - 6.4          Diabetes: >6.4          Glycemic control for adults with diabetes: <7.0           Passed - Valid encounter within last 6 months    Recent Outpatient Visits           3 months ago No-show for appointment   [facility] M, M   3 months ago Gastroesophageal reflux disease without esophagitis   [facility], MD   7 months ago Right thigh pain   [facility], MD   7 months ago Right thigh pain   [facility], MD   9 months ago Benign essential hypertension   [facility], MD       Future Appointments             In 1 week Alba Cory, MD [facility], [facility]

## 2021-02-07 NOTE — Progress Notes (Signed)
Name: [name]   MRN: [id number]    DOB: 1966/03/09   Date:02/08/2021       Progress Note  Subjective  Chief Complaint  Follow Up  HPI  S/p Right hip Replacement : she had right hip pain for months , finally got right hip replacement in March 2022 by Dr. Odis Luster however struggling with a limp ( right leg much longer than left ), she also has soreness on right thigh and feels stiff when she gets up . She is disappointed with the outcome. I will refer her to podiatrist to consider a heel cup or special shoes   Obesity: BMI above 30 but right under 35. She was going to the weight loss clinic, she states phentermine worked well for her, but since she has been off medication bp is at goal. She states she stopped going to the weight loss clinic because of cost. She became obese in her early 20's and she has tried multiple diets. Such as Weight Watchers, cabbage diet, Atkins , she loses weight on her diet but gains it back. . She tried Ozempic but stopped on her own , she came in in June  2022 and stated a friend was taking Saxenda and we started her on medication , she is doing well on medication She has lost another 7 lbs since last visit   HTN: bp is at goal at [facility]  40/5/25 , bp is at goal, continue medications. She denies chest pain, palpitation or dizziness. We will continue current dose    Anxiety: she continues to feel anxious. Symptoms started when her son went to prison around 2014 for selling drugs, he was  out of jail, but he was in a fight and got arrested and they bailed him out,  he feel his probation for positive drug test, he is back in prison but will be out in December 2022. She has been very worried because he was in [place] due to COVID-19. She is taking Fluoxetine daily an dalso likely buspar, needs refill    GERD: she  is back on PPI and symptoms controlled with Omeprazole. No heartburn or indigestion    Pre-diabetes: she denies polyphagia, polyuria or polydipsia.  Losing weight, happy with it, last A1C was 6.3 %, she will have screen for work next month and will send me results.    OSA: also has LVH,  hypertension, interrupted sleep - She went to pulmonologist - Dr. Craige Cotta , has moderate OSA, but never got the supplies. She is waking up in the morning with sore throat, still snoring, she forgot to contact pulmonologist   Hyperlipidemia: taking Rosuvastatin and denies side effects, we will recheck labs on her next visit    Elevated sed rate: labs done 03/2020, at 46, she will have knee replacement surgery soon, we will hold off on rechecking level until  next visit to recheck levels   Patient Active Problem List   Diagnosis Date Noted   History of total hip replacement, right 10/10/2020   OSA (obstructive sleep apnea) 09/08/2019   Ganglion cyst of dorsum of left wrist 02/05/2018   LVH (left ventricular hypertrophy) due to hypertensive disease, without heart failure 09/23/2017   SOBOE (shortness of breath on exertion) 09/23/2017   Hyperlipidemia, mixed 08/28/2017   Hyperglycemia 08/25/2017   Hypercalcemia due to thiazide and vitamin A 08/25/2017   Hypertension 08/18/2017   GAD (generalized anxiety disorder) 08/18/2017   GERD (gastroesophageal reflux disease) 08/18/2017   Panic attack 08/18/2017  Tobacco use 08/18/2017   Systolic ejection murmur 08/18/2017    Past Surgical History:  Procedure Laterality Date   ABDOMINAL HYSTERECTOMY     Complete Hysterectomy   CHOLECYSTECTOMY     GANGLION CYST EXCISION Left 03/01/2018   Procedure: REMOVAL GANGLION OF WRIST;  Surgeon: Deeann Saint, MD;  Location: [facility] OR[facility];  Service: Orthopedics;  Laterality: Left;   TOTAL HIP ARTHROPLASTY Right 10/10/2020   Procedure: TOTAL HIP ARTHROPLASTY ANTERIOR APPROACH;  Surgeon: Lyndle Herrlich, MD;  Location: [facility];  Service: Orthopedics;  Laterality: Right;    Family History  Problem Relation Age of Onset   Hypertension Mother    Stroke Father    Hypertension  Father    Hypertension Sister    Breast cancer Neg Hx     Social History   Tobacco Use   Smoking status: Every Day    Packs/day: 0.50    Years: 18.00    Pack years: 9.00    Types: Cigarettes    Start date: 08/19/1999   Smokeless tobacco: Never  Substance Use Topics   Alcohol use: Yes    Alcohol/week: 1.0 standard drink    Types: 1 Glasses of wine per week    Comment: rare on Saturdays      Current Outpatient Medications:    aspirin 81 MG chewable tablet, Chew 1 tablet (81 mg total) by mouth 2 (two) times daily., Disp: 60 tablet, Rfl: 0   FLUoxetine (PROZAC) 20 MG tablet, Take 1 tablet (20 mg total) by mouth daily., Disp: 90 tablet, Rfl: 1   fluticasone (FLONASE) 50 MCG/ACT nasal spray, Place 2 sprays into both nostrils daily as needed for allergies., Disp: 48 g, Rfl: 1   HYDROcodone-acetaminophen (NORCO/VICODIN) 5-325 MG tablet, Take 1 tablet by mouth every 4 (four) hours as needed for moderate pain (pain score 4-6)., Disp: 30 tablet, Rfl: 0   busPIRone (BUSPAR) 5 MG tablet, Take 1 tablet (5 mg total) by mouth 2 (two) times daily., Disp: 180 tablet, Rfl: 0   Insulin Pen Needle (NOVOFINE) 32G X 6 MM MISC, 1 each by Does not apply route daily. (Patient not taking: Reported on 02/08/2021), Disp: 100 each, Rfl: 2   Liraglutide -Weight Management (SAXENDA) 18 MG/3ML SOPN, Inject 3 mg into the skin daily with breakfast., Disp: 15 mL, Rfl: 1   Olmesartan-amLODIPine-HCTZ 40-5-25 MG TABS, Take 1 tablet by mouth every morning., Disp: 90 tablet, Rfl: 1   omeprazole (PRILOSEC) 40 MG capsule, Take 1 capsule (40 mg total) by mouth daily as needed (heartburn)., Disp: 90 capsule, Rfl: 1   rosuvastatin (CRESTOR) 10 MG tablet, Take 1 tablet (10 mg total) by mouth every morning., Disp: 90 tablet, Rfl: 1  Allergies  Allergen Reactions   Penicillins Itching and Swelling    Has patient had a PCN reaction causing immediate rash, facial/tongue/throat swelling, SOB or lightheadedness with hypotension:  No Has patient had a PCN reaction causing severe rash involving mucus membranes or skin necrosis: No Has patient had a PCN reaction that required hospitalization: No Has patient had a PCN reaction occurring within the last 10 years: No If all of the above answers are "NO", then may proceed with Cephalosporin use.     I personally reviewed active problem list, medication list, allergies, family history, social history, health maintenance with the patient/caregiver today.   ROS  Constitutional: Negative for fever or weight change.  Respiratory: Negative for cough and shortness of breath.   Cardiovascular: Negative for chest pain or palpitations.  Gastrointestinal:  Negative for abdominal pain, no bowel changes.  Musculoskeletal: Positive for gait problem but no joint swelling.  Skin: Negative for rash.  Neurological: Negative for dizziness or headache.  No other specific complaints in a complete review of systems (except as listed in HPI above).   Objective  Vitals:   02/08/21 0840  BP: 128/68  Pulse: 84  Resp: 16  Temp: 98.8 F (37.1 C)  TempSrc: Oral  SpO2: 96%  Weight: 165 lb (74.8 kg)  Height: 5\' 2"  (1.575 m)    Body mass index is 30.18 kg/m.  Physical Exam  Constitutional: Patient appears well-developed and well-nourished. Obese  No distress.  HEENT: head atraumatic, normocephalic, pupils equal and reactive to light, neck supple Cardiovascular: Normal rate, regular rhythm and normal heart sounds.  No murmur heard. No BLE edema. Pulmonary/Chest: Effort normal and breath sounds normal. No respiratory distress. Abdominal: Soft.  There is no tenderness. Muscular Skeletal: right leg length discrepancy, antalgic gait.  Psychiatric: Patient has a normal mood and affect. behavior is normal. Judgment and thought content normal.   PHQ2/9: Depression screen [facility] 2/9 02/08/2021 10/08/2020 07/06/2020 06/12/2020 04/10/2020  Decreased Interest 2 0 0 0 0  Down, Depressed, Hopeless  2 0 0 0 0  PHQ - 2 Score 4 0 0 0 0  Altered sleeping 1 - - - -  Tired, decreased energy 0 - - - -  Change in appetite 0 - - - -  Feeling bad or failure about yourself  0 - - - -  Trouble concentrating 0 - - - -  Moving slowly or fidgety/restless 0 - - - -  Suicidal thoughts 0 - - - -  PHQ-9 Score 5 - - - -  Difficult doing work/chores - - - - -  Some recent data might be hidden    phq 9 is positive   Fall Risk: Fall Risk  02/08/2021 10/08/2020 07/06/2020 06/12/2020 04/10/2020  Falls in the past year? 0 0 0 1 0  Number falls in past yr: 0 0 0 0 0  Injury with Fall? 0 0 0 0 0      Functional Status Survey: Is the patient deaf or have difficulty hearing?: No Does the patient have difficulty seeing, even when wearing glasses/contacts?: No Does the patient have difficulty concentrating, remembering, or making decisions?: No Does the patient have difficulty walking or climbing stairs?: Yes Does the patient have difficulty dressing or bathing?: No Does the patient have difficulty doing errands alone such as visiting a doctor's office or shopping?: No    Assessment & Plan  1. Pre-diabetes   2. GAD (generalized anxiety disorder)  - busPIRone (BUSPAR) 5 MG tablet; Take 1 tablet (5 mg total) by mouth 2 (two) times daily.  Dispense: 180 tablet; Refill: 0 - FLUoxetine (PROZAC) 20 MG tablet; Take 1 tablet (20 mg total) by mouth daily.  Dispense: 90 tablet; Refill: 1  3. Need for pneumococcal vaccine  - Pneumococcal conjugate vaccine 20-valent (Prevnar 20)  4. Gastroesophageal reflux disease without esophagitis  - omeprazole (PRILOSEC) 40 MG capsule; Take 1 capsule (40 mg total) by mouth daily as needed (heartburn).  Dispense: 90 capsule; Refill: 1  5. Need for shingles vaccine  - Varicella-zoster vaccine IM  6. OSA (obstructive sleep apnea)   7. Benign essential hypertension  - Olmesartan-amLODIPine-HCTZ 40-5-25 MG TABS; Take 1 tablet by mouth every morning.  Dispense: 90  tablet; Refill: 1  8. LVH (left ventricular hypertrophy) due to hypertensive disease, without heart failure  9. Obesity (BMI 30.0-34.9)  - Liraglutide -Weight Management (SAXENDA) 18 MG/3ML SOPN; Inject 3 mg into the skin daily with breakfast.  Dispense: 15 mL; Refill: 1  10. History of right hip replacement   11. Pure hypercholesterolemia  - rosuvastatin (CRESTOR) 10 MG tablet; Take 1 tablet (10 mg total) by mouth every morning.  Dispense: 90 tablet; Refill: 1  12. Acquired leg length discrepancy  - Ambulatory referral to Podiatry

## 2021-02-08 ENCOUNTER — Other Ambulatory Visit: Payer: Self-pay

## 2021-02-08 ENCOUNTER — Encounter: Payer: Self-pay | Admitting: Family Medicine

## 2021-02-08 ENCOUNTER — Ambulatory Visit (INDEPENDENT_AMBULATORY_CARE_PROVIDER_SITE_OTHER): Payer: Managed Care, Other (non HMO) | Admitting: Family Medicine

## 2021-02-08 VITALS — BP 128/68 | HR 84 | Temp 98.8°F | Resp 16 | Ht 62.0 in | Wt 165.0 lb

## 2021-02-08 DIAGNOSIS — I119 Hypertensive heart disease without heart failure: Secondary | ICD-10-CM

## 2021-02-08 DIAGNOSIS — M2170 Unequal limb length (acquired), unspecified site: Secondary | ICD-10-CM

## 2021-02-08 DIAGNOSIS — G4733 Obstructive sleep apnea (adult) (pediatric): Secondary | ICD-10-CM

## 2021-02-08 DIAGNOSIS — F411 Generalized anxiety disorder: Secondary | ICD-10-CM

## 2021-02-08 DIAGNOSIS — Z23 Encounter for immunization: Secondary | ICD-10-CM

## 2021-02-08 DIAGNOSIS — E669 Obesity, unspecified: Secondary | ICD-10-CM

## 2021-02-08 DIAGNOSIS — K219 Gastro-esophageal reflux disease without esophagitis: Secondary | ICD-10-CM | POA: Diagnosis not present

## 2021-02-08 DIAGNOSIS — R7303 Prediabetes: Secondary | ICD-10-CM

## 2021-02-08 DIAGNOSIS — Z96641 Presence of right artificial hip joint: Secondary | ICD-10-CM

## 2021-02-08 DIAGNOSIS — I10 Essential (primary) hypertension: Secondary | ICD-10-CM

## 2021-02-08 DIAGNOSIS — E7800 Pure hypercholesterolemia, unspecified: Secondary | ICD-10-CM

## 2021-02-08 MED ORDER — SAXENDA 18 MG/3ML ~~LOC~~ SOPN: 3.0000 mg | PEN_INJECTOR | Freq: Every day | SUBCUTANEOUS | 1 refills | Status: DC

## 2021-02-08 MED ORDER — OMEPRAZOLE 40 MG PO CPDR: 40.0000 mg | DELAYED_RELEASE_CAPSULE | Freq: Every day | ORAL | 1 refills | Status: DC | PRN

## 2021-02-08 MED ORDER — BUSPIRONE HCL 5 MG PO TABS: 5.0000 mg | ORAL_TABLET | Freq: Two times a day (BID) | ORAL | 0 refills | Status: DC

## 2021-02-08 MED ORDER — ROSUVASTATIN CALCIUM 10 MG PO TABS: 10.0000 mg | ORAL_TABLET | ORAL | 1 refills | Status: DC

## 2021-02-08 MED ORDER — FLUOXETINE HCL 20 MG PO TABS: 20.0000 mg | ORAL_TABLET | Freq: Every day | ORAL | 1 refills | Status: DC

## 2021-02-08 MED ORDER — OLMESARTAN-AMLODIPINE-HCTZ 40-5-25 MG PO TABS: 1.0000 | ORAL_TABLET | ORAL | 1 refills | Status: DC

## 2021-02-12 ENCOUNTER — Other Ambulatory Visit: Payer: Self-pay

## 2021-02-12 ENCOUNTER — Ambulatory Visit
Admission: EM | Admit: 2021-02-12 | Discharge: 2021-02-12 | Disposition: A | Discharge status: Home / Self Care | Payer: Managed Care, Other (non HMO) | Attending: Physician Assistant | Admitting: Physician Assistant

## 2021-02-12 DIAGNOSIS — J029 Acute pharyngitis, unspecified: Secondary | ICD-10-CM

## 2021-02-12 DIAGNOSIS — Z79899 Other long term (current) drug therapy: Secondary | ICD-10-CM | POA: Insufficient documentation

## 2021-02-12 DIAGNOSIS — Z20822 Contact with and (suspected) exposure to covid-19: Secondary | ICD-10-CM | POA: Diagnosis not present

## 2021-02-12 DIAGNOSIS — J039 Acute tonsillitis, unspecified: Secondary | ICD-10-CM | POA: Diagnosis not present

## 2021-02-12 DIAGNOSIS — R059 Cough, unspecified: Secondary | ICD-10-CM | POA: Diagnosis present

## 2021-02-12 DIAGNOSIS — Z96641 Presence of right artificial hip joint: Secondary | ICD-10-CM | POA: Insufficient documentation

## 2021-02-12 DIAGNOSIS — Z880 Allergy status to penicillin: Secondary | ICD-10-CM | POA: Diagnosis not present

## 2021-02-12 DIAGNOSIS — F17210 Nicotine dependence, cigarettes, uncomplicated: Secondary | ICD-10-CM | POA: Diagnosis not present

## 2021-02-12 DIAGNOSIS — Z7982 Long term (current) use of aspirin: Secondary | ICD-10-CM | POA: Diagnosis not present

## 2021-02-12 LAB — POCT RAPID STREP A: Streptococcus, Group A Screen (Direct): NEGATIVE

## 2021-02-12 MED ORDER — PREDNISONE 20 MG PO TABS: 40.0000 mg | ORAL_TABLET | Freq: Every day | ORAL | 0 refills | Status: AC

## 2021-02-12 MED ORDER — LIDOCAINE VISCOUS HCL 2 % MT SOLN: 15.0000 mL | OROMUCOSAL | 0 refills | Status: DC | PRN

## 2021-02-12 MED ORDER — AZITHROMYCIN 250 MG PO TABS: 250.0000 mg | ORAL_TABLET | Freq: Every day | ORAL | 0 refills | Status: DC

## 2021-02-12 NOTE — Discharge Instructions (Addendum)
-Negative rapid strep.  Sending for culture.  Treating for suspected strep at this time given your severe sore throat and mostly lack of other symptoms.  I have sent in azithromycin since you have an allergy to penicillin.  Also sent in prednisone and viscous lidocaine.  Increase rest and fluids.  COVID test will be back tomorrow.  See more formation about that below.  You have received COVID testing today either for positive exposure, concerning symptoms that could be related to COVID infection, screening purposes, or re-testing after confirmed positive.  Your test obtained today checks for active viral infection in the last 1-2 weeks. If your test is negative now, you can still test positive later. So, if you do develop symptoms you should either get re-tested and/or isolate x 5 days and then strict mask use x 5 days (unvaccinated) or mask use x 10 days (vaccinated). Please follow CDC guidelines.  While Rapid antigen tests come back in 15-20 minutes, send out PCR/molecular test results typically come back within 1-3 days. In the mean time, if you are symptomatic, assume this could be a positive test and treat/monitor yourself as if you do have COVID.   We will call with test results if positive. Please download the MyChart app and set up a profile to access test results.   If symptomatic, go home and rest. Push fluids. Take Tylenol as needed for discomfort. Gargle warm salt water. Throat lozenges. Take Mucinex DM or Robitussin for cough. Humidifier in bedroom to ease coughing. Warm showers. Also review the COVID handout for more information.  COVID-19 INFECTION: The incubation period of COVID-19 is approximately 14 days after exposure, with most symptoms developing in roughly 4-5 days. Symptoms may range in severity from mild to critically severe. Roughly 80% of those infected will have mild symptoms. People of any age may become infected with COVID-19 and have the ability to transmit the virus. The  most common symptoms include: fever, fatigue, cough, body aches, headaches, sore throat, nasal congestion, shortness of breath, nausea, vomiting, diarrhea, changes in smell and/or taste.    COURSE OF ILLNESS Some patients may begin with mild disease which can progress quickly into critical symptoms. If your symptoms are worsening please call ahead to the Emergency Department and proceed there for further treatment. Recovery time appears to be roughly 1-2 weeks for mild symptoms and 3-6 weeks for severe disease.   GO IMMEDIATELY TO ER FOR FEVER YOU ARE UNABLE TO GET DOWN WITH TYLENOL, BREATHING PROBLEMS, CHEST PAIN, FATIGUE, LETHARGY, INABILITY TO EAT OR DRINK, ETC  QUARANTINE AND ISOLATION: To help decrease the spread of COVID-19 please remain isolated if you have COVID infection or are highly suspected to have COVID infection. This means -stay home and isolate to one room in the home if you live with others. Do not share a bed or bathroom with others while ill, sanitize and wipe down all countertops and keep common areas clean and disinfected. Stay home for 5 days. If you have no symptoms or your symptoms are resolving after 5 days, you can leave your house. Continue to wear a mask around others for 5 additional days. If you have been in close contact (within 6 feet) of someone diagnosed with COVID 19, you are advised to quarantine in your home for 14 days as symptoms can develop anywhere from 2-14 days after exposure to the virus. If you develop symptoms, you  must isolate.  Most current guidelines for COVID after exposure -unvaccinated: isolate 5 days  and strict mask use x 5 days. Test on day 5 is possible -vaccinated: wear mask x 10 days if symptoms do not develop -You do not necessarily need to be tested for COVID if you have + exposure and  develop symptoms. Just isolate at home x10 days from symptom onset During this global pandemic, CDC advises to practice social distancing, try to stay at least  35ft away from others at all times. Wear a face covering. Wash and sanitize your hands regularly and avoid going anywhere that is not necessary.  KEEP IN MIND THAT THE COVID TEST IS NOT 100% ACCURATE AND YOU SHOULD STILL DO EVERYTHING TO PREVENT POTENTIAL SPREAD OF VIRUS TO OTHERS (WEAR MASK, WEAR GLOVES, WASH HANDS AND SANITIZE REGULARLY). IF INITIAL TEST IS NEGATIVE, THIS MAY NOT MEAN YOU ARE DEFINITELY NEGATIVE. MOST ACCURATE TESTING IS DONE 5-7 DAYS AFTER EXPOSURE.   It is not advised by CDC to get re-tested after receiving a positive COVID test since you can still test positive for weeks to months after you have already cleared the virus.   *If you have not been vaccinated for COVID, I strongly suggest you consider getting vaccinated as long as there are no contraindications.

## 2021-02-12 NOTE — ED Provider Notes (Signed)
[facility]    CSN: [id number] Arrival date & time: 02/12/21  1854      History   Chief Complaint Chief Complaint  Patient presents with   Sore Throat    HPI [name] is a 55 y.o. female presenting for approximately 1 week history of sore throat and painful swallowing.  She also admits to swollen lymph nodes.  Admits to very mild cough which is more like clearing her throat.  She denies any fever, fatigue, body aches, congestion, headaches, chest pain, shortness of breath, and/V/D or abdominal pain.  No sick contacts and no known exposure to strep, flu or COVID-19.  Vaccinated for COVID-19 x3.  Has taken multiple over-the-counter decongestants without improvement in symptoms.  No other complaints or concerns.  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   OSA (obstructive sleep apnea) 09/08/2019   Ganglion cyst of dorsum of left wrist 02/05/2018   LVH (left ventricular hypertrophy) due to hypertensive disease, without heart failure 09/23/2017   SOBOE (shortness of breath on exertion) 09/23/2017   Hyperlipidemia, mixed 08/28/2017   Hyperglycemia 08/25/2017   Hypercalcemia due to thiazide and vitamin A 08/25/2017   Hypertension 08/18/2017   GAD (generalized anxiety disorder) 08/18/2017   GERD (gastroesophageal reflux disease) 08/18/2017   Panic attack 08/18/2017   Tobacco use 08/18/2017   Systolic ejection murmur 08/18/2017    Past Surgical History:  Procedure Laterality Date   ABDOMINAL HYSTERECTOMY     Complete Hysterectomy   CHOLECYSTECTOMY     GANGLION CYST EXCISION Left 03/01/2018   Procedure: REMOVAL GANGLION OF WRIST;  Surgeon: Deeann Saint, MD;  Location: [facility] OR[facility];   Service: Orthopedics;  Laterality: Left;   TOTAL HIP ARTHROPLASTY Right 10/10/2020   Procedure: TOTAL HIP ARTHROPLASTY ANTERIOR APPROACH;  Surgeon: Lyndle Herrlich, MD;  Location: [facility] OR[facility];  Service: Orthopedics;  Laterality: Right;    OB History   No obstetric history on file.      Home Medications    Prior to Admission medications   Medication Sig Start Date End Date Taking? Authorizing Provider  azithromycin (ZITHROMAX) 250 MG tablet Take 1 tablet (250 mg total) by mouth daily. Take first 2 tablets together, then 1 every day until finished. 02/12/21  Yes Eusebio Friendly B, PA-C  lidocaine (XYLOCAINE) 2 % solution Use as directed 15 mLs in the mouth or throat every 3 (three) hours as needed for mouth pain (swisha and spit). 02/12/21  Yes Shirlee Latch, PA-C  predniSONE (DELTASONE) 20 MG tablet Take 2 tablets (40 mg total) by mouth daily for 5 days. 02/12/21 02/17/21 Yes Shirlee Latch, PA-C  aspirin 81 MG chewable tablet Chew 1 tablet (81 mg total) by mouth 2 (two) times daily. 10/11/20   Altamese Cabal, PA-C  busPIRone (BUSPAR) 5 MG tablet Take 1 tablet (5 mg total) by mouth 2 (two) times daily. 02/08/21   Alba Cory, MD  FLUoxetine (PROZAC) 20 MG tablet Take 1 tablet (20 mg total) by mouth daily. 02/08/21   Alba Cory, MD  fluticasone (FLONASE) 50 MCG/ACT nasal spray Place 2 sprays into both nostrils daily as needed for allergies. 10/08/20  Alba Cory, MD  HYDROcodone-acetaminophen (NORCO/VICODIN) 5-325 MG tablet Take 1 tablet by mouth every 4 (four) hours as needed for moderate pain (pain score 4-6). 10/11/20   Altamese Cabal, PA-C  Insulin Pen Needle (NOVOFINE) 32G X 6 MM MISC 1 each by Does not apply route daily. Patient not taking: Reported on 02/08/2021 01/09/20   Alba Cory, MD  Liraglutide -Weight Management (SAXENDA) 18 MG/3ML SOPN Inject 3 mg into the skin daily with breakfast. 02/08/21   Alba Cory, MD  Olmesartan-amLODIPine-HCTZ 40-5-25 MG TABS Take 1 tablet by  mouth every morning. 02/08/21   Alba Cory, MD  omeprazole (PRILOSEC) 40 MG capsule Take 1 capsule (40 mg total) by mouth daily as needed (heartburn). 02/08/21   Alba Cory, MD  rosuvastatin (CRESTOR) 10 MG tablet Take 1 tablet (10 mg total) by mouth every morning. 02/08/21   Alba Cory, MD    Family History Family History  Problem Relation Age of Onset   Hypertension Mother    Stroke Father    Hypertension Father    Hypertension Sister    Breast cancer Neg Hx     Social History Social History   Tobacco Use   Smoking status: Every Day    Packs/day: 0.50    Years: 18.00    Pack years: 9.00    Types: Cigarettes    Start date: 08/19/1999   Smokeless tobacco: Never  Vaping Use   Vaping Use: Never used  Substance Use Topics   Alcohol use: Yes    Alcohol/week: 1.0 standard drink    Types: 1 Glasses of wine per week    Comment: rare on Saturdays    Drug use: No     Allergies   Penicillins   Review of Systems Review of Systems  Constitutional:  Negative for chills, diaphoresis, fatigue and fever.  HENT:  Positive for sore throat and voice change. Negative for congestion, ear pain, rhinorrhea, sinus pressure and sinus pain.   Respiratory:  Positive for cough. Negative for shortness of breath.   Gastrointestinal:  Negative for abdominal pain, nausea and vomiting.  Musculoskeletal:  Negative for arthralgias and myalgias.  Skin:  Negative for rash.  Neurological:  Negative for weakness and headaches.  Hematological:  Positive for adenopathy.    Physical Exam Triage Vital Signs ED Triage Vitals  Enc Vitals Group     BP 02/12/21 1912 (!) 141/77     Pulse Rate 02/12/21 1912 89     Resp 02/12/21 1912 18     Temp 02/12/21 1912 98.8 F (37.1 C)     Temp src --      SpO2 02/12/21 1912 98 %     Weight --      Height --      Head Circumference --      Peak Flow --      Pain Score 02/12/21 1911 9     Pain Loc --      Pain Edu? --      Excl. in GC? --     No data found.  Updated Vital Signs BP (!) 141/77   Pulse 89   Temp 98.8 F (37.1 C)   Resp 18   SpO2 98%   Physical Exam Vitals and nursing note reviewed.  Constitutional:      General: She is not in acute distress.    Appearance: Normal appearance. She is not ill-appearing or toxic-appearing.  HENT:     Head: Normocephalic and atraumatic.     Nose:  Nose normal.     Mouth/Throat:     Mouth: Mucous membranes are moist.     Pharynx: Oropharynx is clear. Posterior oropharyngeal erythema present.     Tonsils: 1+ on the right. 1+ on the left.  Eyes:     General: No scleral icterus.       Right eye: No discharge.        Left eye: No discharge.     Conjunctiva/sclera: Conjunctivae normal.  Cardiovascular:     Rate and Rhythm: Normal rate and regular rhythm.     Heart sounds: Normal heart sounds.  Pulmonary:     Effort: Pulmonary effort is normal. No respiratory distress.     Breath sounds: Normal breath sounds.  Musculoskeletal:     Cervical back: Neck supple.  Lymphadenopathy:     Cervical: Cervical adenopathy present.  Skin:    General: Skin is dry.  Neurological:     General: No focal deficit present.     Mental Status: She is alert. Mental status is at baseline.     Motor: No weakness.     Gait: Gait normal.  Psychiatric:        Mood and Affect: Mood normal.        Behavior: Behavior normal.        Thought Content: Thought content normal.     UC Treatments / Results  Labs (all labs ordered are listed, but only abnormal results are displayed) Labs Reviewed  SARS CORONAVIRUS 2 (TAT 6-24 HRS)  POCT RAPID STREP A, ED / UC  POCT RAPID STREP A    EKG   Radiology No results found.  Procedures Procedures (including critical care time)  Medications Ordered in UC Medications - No data to display  Initial Impression / Assessment and Plan / UC Course  I have reviewed the triage vital signs and the nursing notes.  Pertinent labs & imaging results that  were available during my care of the patient were reviewed by me and considered in my medical decision making (see chart for details).  55 year old female presenting for severe sore throat over the past week which is steadily gotten worse.  Admits to painful swallowing and talking.  Also has had swollen lymph nodes.  Mild cough.  Vitals are all normal and stable and patient is overall well-appearing but does shudder at times when she talks because she says her throat hurts so much.  Exam significant for 1+ tonsillar enlargement and erythema.  Also has bilateral tender enlarged anterior cervical lymph nodes.  The remainder the exam is within normal limits.  Rapid strep test negative.  Culture sent.  PCR COVID test also obtained.  Current CDC guidelines, isolation protocol and ED precautions reviewed with patient.  Treating patient for suspected bacterial tonsillitis given her severe symptoms and mostly lack of other symptoms other than the sore throat and painful swallowing.  Treating with azithromycin as she has allergy to penicillin which is severe.  Also sent in prednisone and viscous lidocaine.  Advised her to increase rest and fluids.  ED precautions reviewed again.   Final Clinical Impressions(s) / UC Diagnoses   Final diagnoses:  Acute tonsillitis, unspecified etiology  Sore throat  Cough     Discharge Instructions      -Negative rapid strep.  Sending for culture.  Treating for suspected strep at this time given your severe sore throat and mostly lack of other symptoms.  I have sent in azithromycin since you have an allergy to penicillin.  Also sent in prednisone and viscous lidocaine.  Increase rest and fluids.  COVID test will be back tomorrow.  See more formation about that below.  You have received COVID testing today either for positive exposure, concerning symptoms that could be related to COVID infection, screening purposes, or re-testing after confirmed positive.  Your test  obtained today checks for active viral infection in the last 1-2 weeks. If your test is negative now, you can still test positive later. So, if you do develop symptoms you should either get re-tested and/or isolate x 5 days and then strict mask use x 5 days (unvaccinated) or mask use x 10 days (vaccinated). Please follow CDC guidelines.  While Rapid antigen tests come back in 15-20 minutes, send out PCR/molecular test results typically come back within 1-3 days. In the mean time, if you are symptomatic, assume this could be a positive test and treat/monitor yourself as if you do have COVID.   We will call with test results if positive. Please download the MyChart app and set up a profile to access test results.   If symptomatic, go home and rest. Push fluids. Take Tylenol as needed for discomfort. Gargle warm salt water. Throat lozenges. Take Mucinex DM or Robitussin for cough. Humidifier in bedroom to ease coughing. Warm showers. Also review the COVID handout for more information.  COVID-19 INFECTION: The incubation period of COVID-19 is approximately 14 days after exposure, with most symptoms developing in roughly 4-5 days. Symptoms may range in severity from mild to critically severe. Roughly 80% of those infected will have mild symptoms. People of any age may become infected with COVID-19 and have the ability to transmit the virus. The most common symptoms include: fever, fatigue, cough, body aches, headaches, sore throat, nasal congestion, shortness of breath, nausea, vomiting, diarrhea, changes in smell and/or taste.    COURSE OF ILLNESS Some patients may begin with mild disease which can progress quickly into critical symptoms. If your symptoms are worsening please call ahead to the Emergency Department and proceed there for further treatment. Recovery time appears to be roughly 1-2 weeks for mild symptoms and 3-6 weeks for severe disease.   GO IMMEDIATELY TO ER FOR FEVER YOU ARE UNABLE TO GET  DOWN WITH TYLENOL, BREATHING PROBLEMS, CHEST PAIN, FATIGUE, LETHARGY, INABILITY TO EAT OR DRINK, ETC  QUARANTINE AND ISOLATION: To help decrease the spread of COVID-19 please remain isolated if you have COVID infection or are highly suspected to have COVID infection. This means -stay home and isolate to one room in the home if you live with others. Do not share a bed or bathroom with others while ill, sanitize and wipe down all countertops and keep common areas clean and disinfected. Stay home for 5 days. If you have no symptoms or your symptoms are resolving after 5 days, you can leave your house. Continue to wear a mask around others for 5 additional days. If you have been in close contact (within 6 feet) of someone diagnosed with COVID 19, you are advised to quarantine in your home for 14 days as symptoms can develop anywhere from 2-14 days after exposure to the virus. If you develop symptoms, you  must isolate.  Most current guidelines for COVID after exposure -unvaccinated: isolate 5 days and strict mask use x 5 days. Test on day 5 is possible -vaccinated: wear mask x 10 days if symptoms do not develop -You do not necessarily need to be tested for COVID if you have + exposure  and  develop symptoms. Just isolate at home x10 days from symptom onset During this global pandemic, CDC advises to practice social distancing, try to stay at least 61ft away from others at all times. Wear a face covering. Wash and sanitize your hands regularly and avoid going anywhere that is not necessary.  KEEP IN MIND THAT THE COVID TEST IS NOT 100% ACCURATE AND YOU SHOULD STILL DO EVERYTHING TO PREVENT POTENTIAL SPREAD OF VIRUS TO OTHERS (WEAR MASK, WEAR GLOVES, WASH HANDS AND SANITIZE REGULARLY). IF INITIAL TEST IS NEGATIVE, THIS MAY NOT MEAN YOU ARE DEFINITELY NEGATIVE. MOST ACCURATE TESTING IS DONE 5-7 DAYS AFTER EXPOSURE.   It is not advised by CDC to get re-tested after receiving a positive COVID test since you can  still test positive for weeks to months after you have already cleared the virus.   *If you have not been vaccinated for COVID, I strongly suggest you consider getting vaccinated as long as there are no contraindications.       ED Prescriptions     Medication Sig Dispense Auth. Provider   azithromycin (ZITHROMAX) 250 MG tablet Take 1 tablet (250 mg total) by mouth daily. Take first 2 tablets together, then 1 every day until finished. 6 tablet Eusebio Friendly B, PA-C   predniSONE (DELTASONE) 20 MG tablet Take 2 tablets (40 mg total) by mouth daily for 5 days. 10 tablet Eusebio Friendly B, PA-C   lidocaine (XYLOCAINE) 2 % solution Use as directed 15 mLs in the mouth or throat every 3 (three) hours as needed for mouth pain (swisha and spit). 100 mL Shirlee Latch, PA-C      PDMP not reviewed this encounter.   Shirlee Latch, PA-C 02/12/21 1952

## 2021-02-12 NOTE — ED Triage Notes (Signed)
Pt presents with sore throat for past week

## 2021-02-13 LAB — SARS CORONAVIRUS 2 (TAT 6-24 HRS): SARS Coronavirus 2: NEGATIVE

## 2021-02-18 ENCOUNTER — Ambulatory Visit: Payer: Self-pay | Admitting: Podiatry

## 2021-02-28 ENCOUNTER — Ambulatory Visit (INDEPENDENT_AMBULATORY_CARE_PROVIDER_SITE_OTHER): Payer: Managed Care, Other (non HMO) | Admitting: Podiatry

## 2021-02-28 ENCOUNTER — Other Ambulatory Visit: Payer: Self-pay

## 2021-02-28 DIAGNOSIS — M2170 Unequal limb length (acquired), unspecified site: Secondary | ICD-10-CM | POA: Diagnosis not present

## 2021-02-28 DIAGNOSIS — M21962 Unspecified acquired deformity of left lower leg: Secondary | ICD-10-CM | POA: Diagnosis not present

## 2021-02-28 DIAGNOSIS — M21961 Unspecified acquired deformity of right lower leg: Secondary | ICD-10-CM

## 2021-02-28 DIAGNOSIS — R2689 Other abnormalities of gait and mobility: Secondary | ICD-10-CM

## 2021-02-28 NOTE — Progress Notes (Signed)
Subjective:  Patient ID: [name], female    DOB: March 18, 1966,  MRN: [id number]  Chief Complaint  Patient presents with   Foot Orthotics    Foot exam  PT stated that one leg is shorter than the other and is wanting something to help even out the difference     55 y.o. female presents with the above complaint.  Patient presents with primary complaint of limb length discrepancy with the left side being shorter than right side.  Patient states that there is she does not have any foot and ankle however causes her to limp and it hurts.  She has a history of hip replacement that was done on the right side which made the right leg longer than the left side.  She wanted to get it evaluated she would like to discuss shoes with heel lifts or insoles with heel lifts.  She has not seen anyone else prior to seeing me.  She denies any other acute complaints.   Review of Systems: Negative except as noted in the HPI. Denies N/V/F/Ch.  Past Medical History:  Diagnosis Date   Acid reflux    Arthritis    GAD (generalized anxiety disorder)    Hypertension    OSA (obstructive sleep apnea) 09/08/2019   does not have a cpap   Tobacco use     Current Outpatient Medications:    aspirin 81 MG chewable tablet, Chew 1 tablet (81 mg total) by mouth 2 (two) times daily., Disp: 60 tablet, Rfl: 0   azithromycin (ZITHROMAX) 250 MG tablet, Take 1 tablet (250 mg total) by mouth daily. Take first 2 tablets together, then 1 every day until finished., Disp: 6 tablet, Rfl: 0   busPIRone (BUSPAR) 5 MG tablet, Take 1 tablet (5 mg total) by mouth 2 (two) times daily., Disp: 180 tablet, Rfl: 0   FLUoxetine (PROZAC) 20 MG tablet, Take 1 tablet (20 mg total) by mouth daily., Disp: 90 tablet, Rfl: 1   fluticasone (FLONASE) 50 MCG/ACT nasal spray, Place 2 sprays into both nostrils daily as needed for allergies., Disp: 48 g, Rfl: 1   HYDROcodone-acetaminophen (NORCO/VICODIN) 5-325 MG tablet, Take 1 tablet by mouth every 4  (four) hours as needed for moderate pain (pain score 4-6)., Disp: 30 tablet, Rfl: 0   Insulin Pen Needle (NOVOFINE) 32G X 6 MM MISC, 1 each by Does not apply route daily. (Patient not taking: Reported on 02/08/2021), Disp: 100 each, Rfl: 2   lidocaine (XYLOCAINE) 2 % solution, Use as directed 15 mLs in the mouth or throat every 3 (three) hours as needed for mouth pain (swisha and spit)., Disp: 100 mL, Rfl: 0   Liraglutide -Weight Management (SAXENDA) 18 MG/3ML SOPN, Inject 3 mg into the skin daily with breakfast., Disp: 15 mL, Rfl: 1   Olmesartan-amLODIPine-HCTZ 40-5-25 MG TABS, Take 1 tablet by mouth every morning., Disp: 90 tablet, Rfl: 1   omeprazole (PRILOSEC) 40 MG capsule, Take 1 capsule (40 mg total) by mouth daily as needed (heartburn)., Disp: 90 capsule, Rfl: 1   rosuvastatin (CRESTOR) 10 MG tablet, Take 1 tablet (10 mg total) by mouth every morning., Disp: 90 tablet, Rfl: 1  Social History   Tobacco Use  Smoking Status Every Day   Packs/day: 0.50   Years: 18.00   Pack years: 9.00   Types: Cigarettes   Start date: 08/19/1999  Smokeless Tobacco Never    Allergies  Allergen Reactions   Penicillins Itching and Swelling    Has patient had  a PCN reaction causing immediate rash, facial/tongue/throat swelling, SOB or lightheadedness with hypotension: No Has patient had a PCN reaction causing severe rash involving mucus membranes or skin necrosis: No Has patient had a PCN reaction that required hospitalization: No Has patient had a PCN reaction occurring within the last 10 years: No If all of the above answers are "NO", then may proceed with Cephalosporin use.    Objective:  There were no vitals filed for this visit. There is no height or weight on file to calculate BMI. Constitutional Well developed. Well nourished.  Vascular Dorsalis pedis pulses palpable bilaterally. Posterior tibial pulses palpable bilaterally. Capillary refill normal to all digits.  No cyanosis or clubbing  noted. Pedal hair growth normal.  Neurologic Normal speech. Oriented to person, place, and time. Epicritic sensation to light touch grossly present bilaterally.  Dermatologic Nails well groomed and normal in appearance. No open wounds. No skin lesions.  Orthopedic: Gait examination shows uneven limb length of about 1.5 cm with short side being the left side.  Patient is ambulating with antalgic gait.  Hip drop noted.   Radiographs: None Assessment:   1. Deformity of both feet   2. Antalgic gait   3. Acquired unequal limb length    Plan:  Patient was evaluated and treated and all questions answered.  Limb length discrepancy with left side shorter than right side with antalgic gait with underlying foot deformity -I explained the patient etiology limiting discrepancy versus treatment options were discussed.  Given that she is a status post hip arthroplasty I believe this is likely the culprit for having uneven limb length.  At this time patient will benefit from a shoe versus orthotic to address the discrepancy of limited.  She will be scheduled to see EJ for custom-made orthotics versus limb length discrepancy and building the arch or the shoe up to it.  I discussed this with patient extensive detail she states understanding. -  No follow-ups on file.

## 2021-03-21 ENCOUNTER — Encounter: Payer: Self-pay | Admitting: Unknown Physician Specialty

## 2021-03-21 ENCOUNTER — Telehealth (INDEPENDENT_AMBULATORY_CARE_PROVIDER_SITE_OTHER): Payer: Managed Care, Other (non HMO) | Admitting: Unknown Physician Specialty

## 2021-03-21 DIAGNOSIS — K219 Gastro-esophageal reflux disease without esophagitis: Secondary | ICD-10-CM

## 2021-03-21 MED ORDER — AZITHROMYCIN 250 MG PO TABS: ORAL_TABLET | ORAL | 0 refills | Status: DC

## 2021-03-21 NOTE — Progress Notes (Signed)
Ht 5\' 1"  (1.549 m) Comment: per patient  Wt 165 lb (74.8 kg) Comment: lbs  BMI 31.18 kg/m    Subjective:    Patient ID: , female    DOB: 1965/08/04, 55 y.o.   MRN: [id number]  HPI: [name] is a 55 y.o. female  Chief Complaint  Patient presents with   Sore Throat   Cough    With phlegm production   This visit was completed via telephone due to the restrictions of the COVID-19 pandemic. All issues as above were discussed and addressed but no physical exam was performed. If it was felt that the patient should be evaluated in the office, they were directed there. The patient verbally consented to this visit. Patient was unable to complete an audio/visual visit due to Technical difficulties. Location of the patient: home Location of the provider: work Those involved with this call:  PTime spent on call: 30 minutes with time on phone and 10 minutes chart review I verified patient identity using two factors (patient name and date of birth). Patient consents verbally to being seen via telemedicine visit today.   Sore Throat  This is a new problem. Episode onset: 1 month.  Tested for Covid and the flu 2 weeks ago.  Both are negatiive.  Given Prednisone and antibiotics for 5 days.  Better during Amoxil but came back. The problem has been unchanged. Sore throat worse side: uvula. There has been no fever. Associated symptoms include congestion, coughing, neck pain and trouble swallowing. Pertinent negatives include no ear pain, headaches, hoarse voice or shortness of breath. She has had no exposure to strep or mono. Treatments tried: The only thing that helps is mucinex and when she took antibiotics.    Relevant past medical, surgical, family and social history reviewed and updated as indicated. Interim medical history since our last visit reviewed. Allergies and medications reviewed and updated.  Review of Systems  HENT:  Positive for congestion and trouble swallowing.  Negative for ear pain and hoarse voice.   Respiratory:  Positive for cough. Negative for shortness of breath.   Musculoskeletal:  Positive for neck pain.  Neurological:  Negative for headaches.   Per HPI unless specifically indicated above     Objective:    Ht 5\' 1"  (1.549 m) Comment: per patient  Wt 165 lb (74.8 kg) Comment: lbs  BMI 31.18 kg/m   Wt Readings from Last 3 Encounters:  03/21/21 165 lb (74.8 kg)  02/08/21 165 lb (74.8 kg)  10/10/20 172 lb (78 kg)    Physical Exam Neurological:     Mental Status: She is alert.  Psychiatric:        Mood and Affect: Mood normal.        Behavior: Behavior normal.    Results for orders placed or performed during the hospital encounter of 02/12/21  SARS CORONAVIRUS 2 (TAT 6-24 HRS) Nasopharyngeal Nasopharyngeal Swab   Specimen: Nasopharyngeal Swab  Result Value Ref Range   SARS Coronavirus 2 NEGATIVE NEGATIVE  POCT rapid strep A  Result Value Ref Range   Streptococcus, Group A Screen (Direct) NEGATIVE NEGATIVE      Assessment & Plan:   Problem List Items Addressed This Visit       Unprioritized   GERD (gastroesophageal reflux disease)    Pt not taking Omeprazole.  I suspect this is causing sore throat and cough.  Restart Omeprazole daily.  If that is not working, OK to take Z pack.  If no improvement, needs seen        Follow up plan: Return if symptoms worsen or fail to improve.

## 2021-03-21 NOTE — Assessment & Plan Note (Signed)
Pt not taking Omeprazole.  I suspect this is causing sore throat and cough.  Restart Omeprazole daily.  If that is not working, OK to take Z pack.  If no improvement, needs seen

## 2021-03-27 ENCOUNTER — Other Ambulatory Visit: Payer: Self-pay

## 2021-03-27 ENCOUNTER — Ambulatory Visit (INDEPENDENT_AMBULATORY_CARE_PROVIDER_SITE_OTHER): Payer: Managed Care, Other (non HMO)

## 2021-03-27 DIAGNOSIS — M2170 Unequal limb length (acquired), unspecified site: Secondary | ICD-10-CM

## 2021-03-27 DIAGNOSIS — M216X1 Other acquired deformities of right foot: Secondary | ICD-10-CM

## 2021-03-27 DIAGNOSIS — R2689 Other abnormalities of gait and mobility: Secondary | ICD-10-CM

## 2021-03-27 DIAGNOSIS — M216X2 Other acquired deformities of left foot: Secondary | ICD-10-CM

## 2021-03-27 IMAGING — CR RIGHT SHOULDER - 2+ VIEW
2 series · 3 of 3 positions shown · non-contrast
Comparison: 09/24/2014

CLINICAL DATA: Right-sided chest pain and right shoulder pain for
2-3 days. No known injury.

EXAM:
RIGHT SHOULDER - 2+ VIEW

[Series 1: shoulder grashey · 0.14mm/px · 2 of 2 slices shown]
[im 1/2]
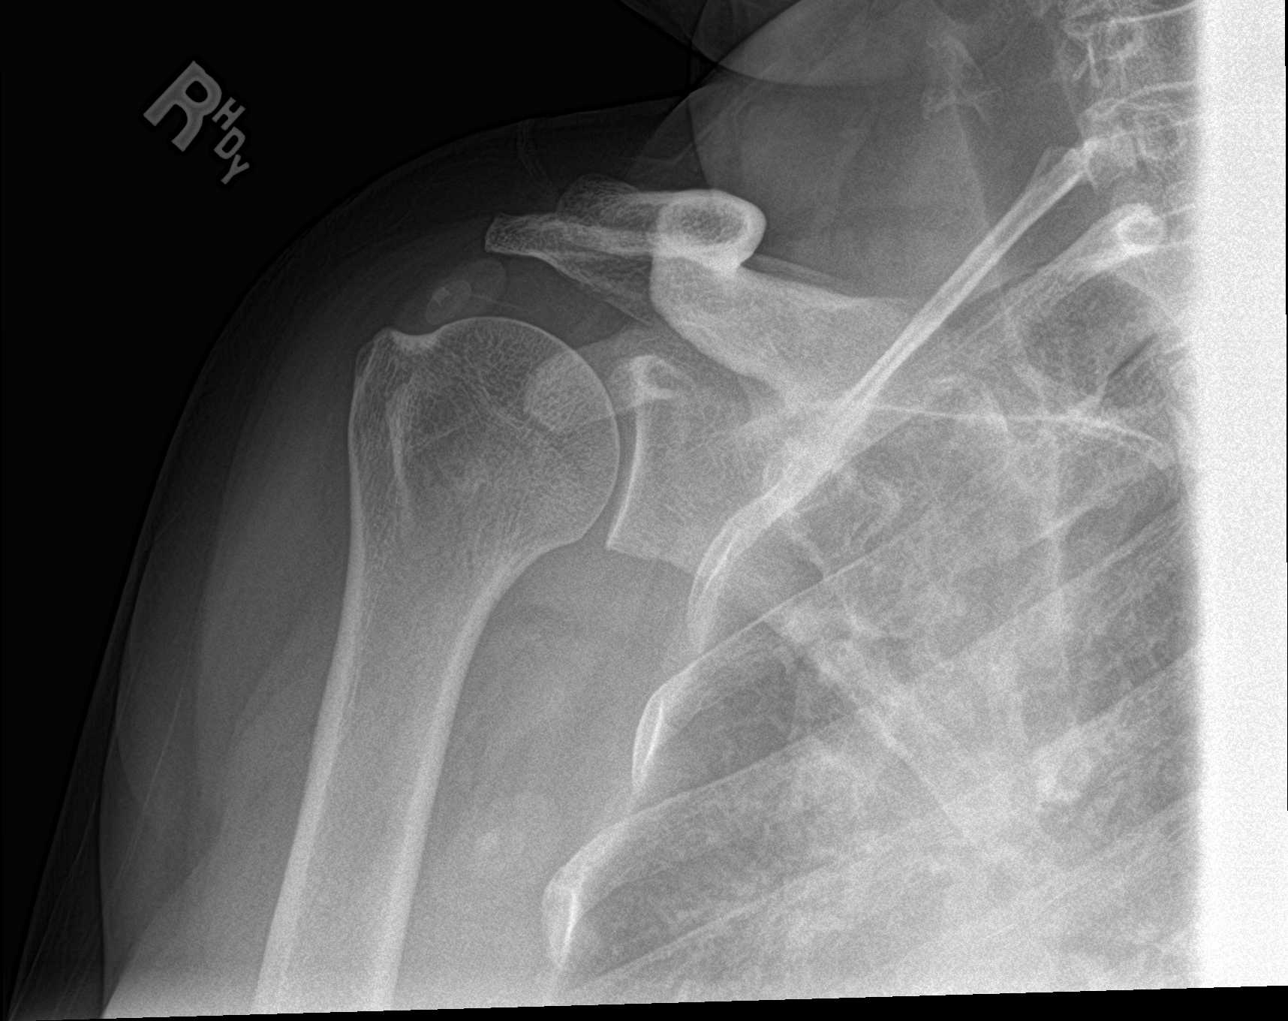
[im 2/2]
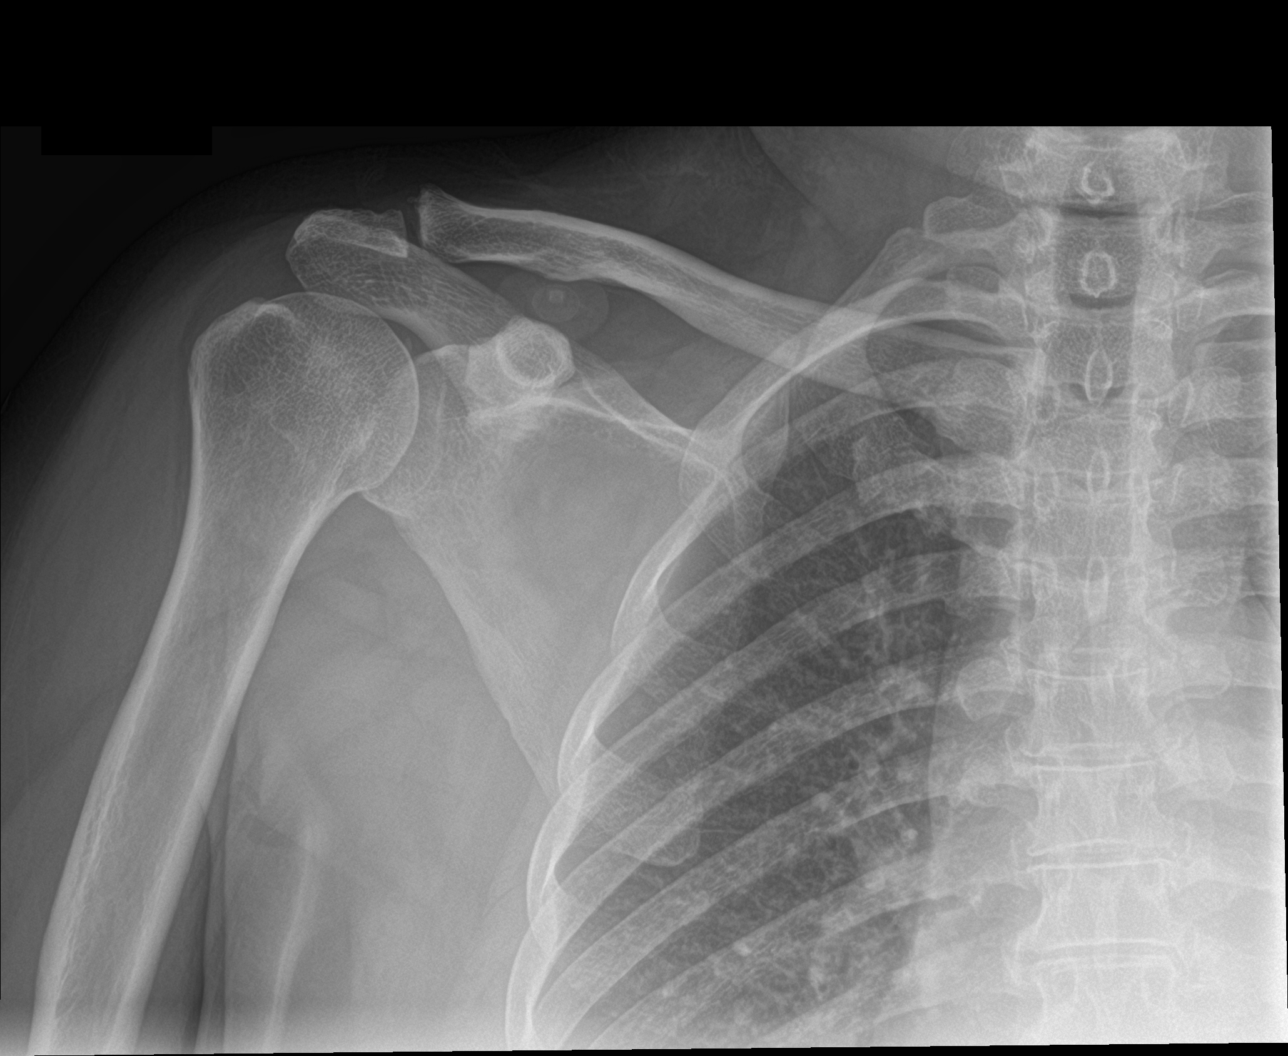

[shoulder y view]
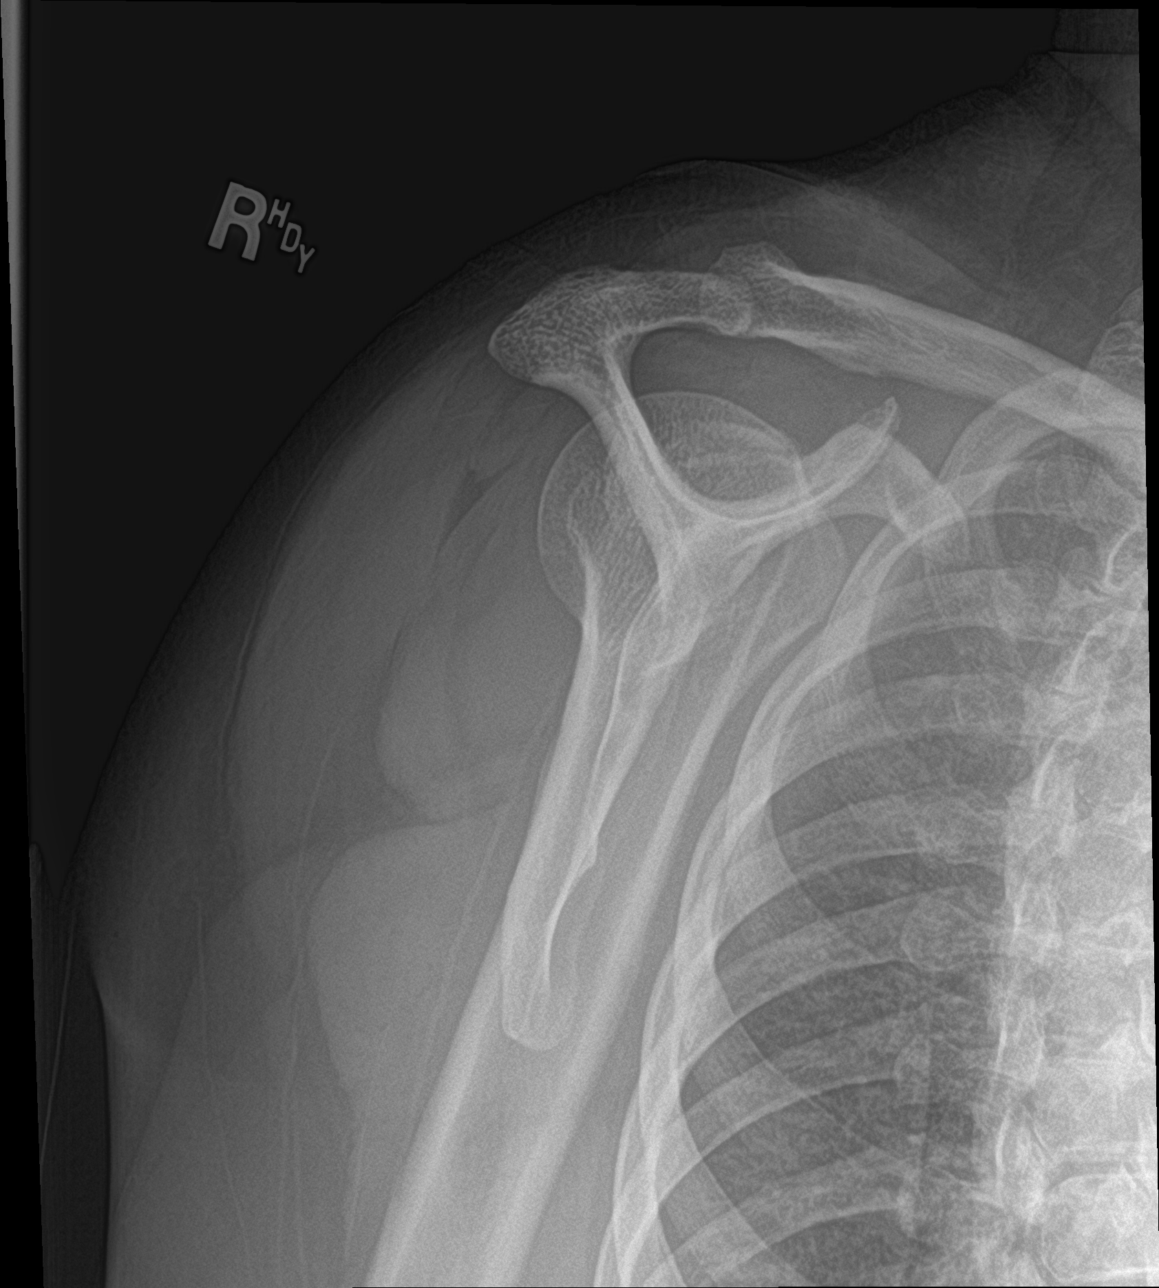

[3 of 3 positions shown; findings below may reference images not displayed]

FINDINGS: The joint spaces are maintained. No acute bony findings or bone
lesion. No abnormal soft tissue calcifications. The visualized lung
is clear and the visualized ribs are intact.
IMPRESSION: Normal and stable right shoulder radiographs.

## 2021-03-27 IMAGING — CR CHEST - 2 VIEW
2 series · 2 of 2 positions shown · non-contrast
Comparison: Chest radiograph 08/12/2017

CLINICAL DATA: Right-sided chest pain and shoulder pain for 2-3
days.

EXAM:
CHEST - 2 VIEW

[chest pa]
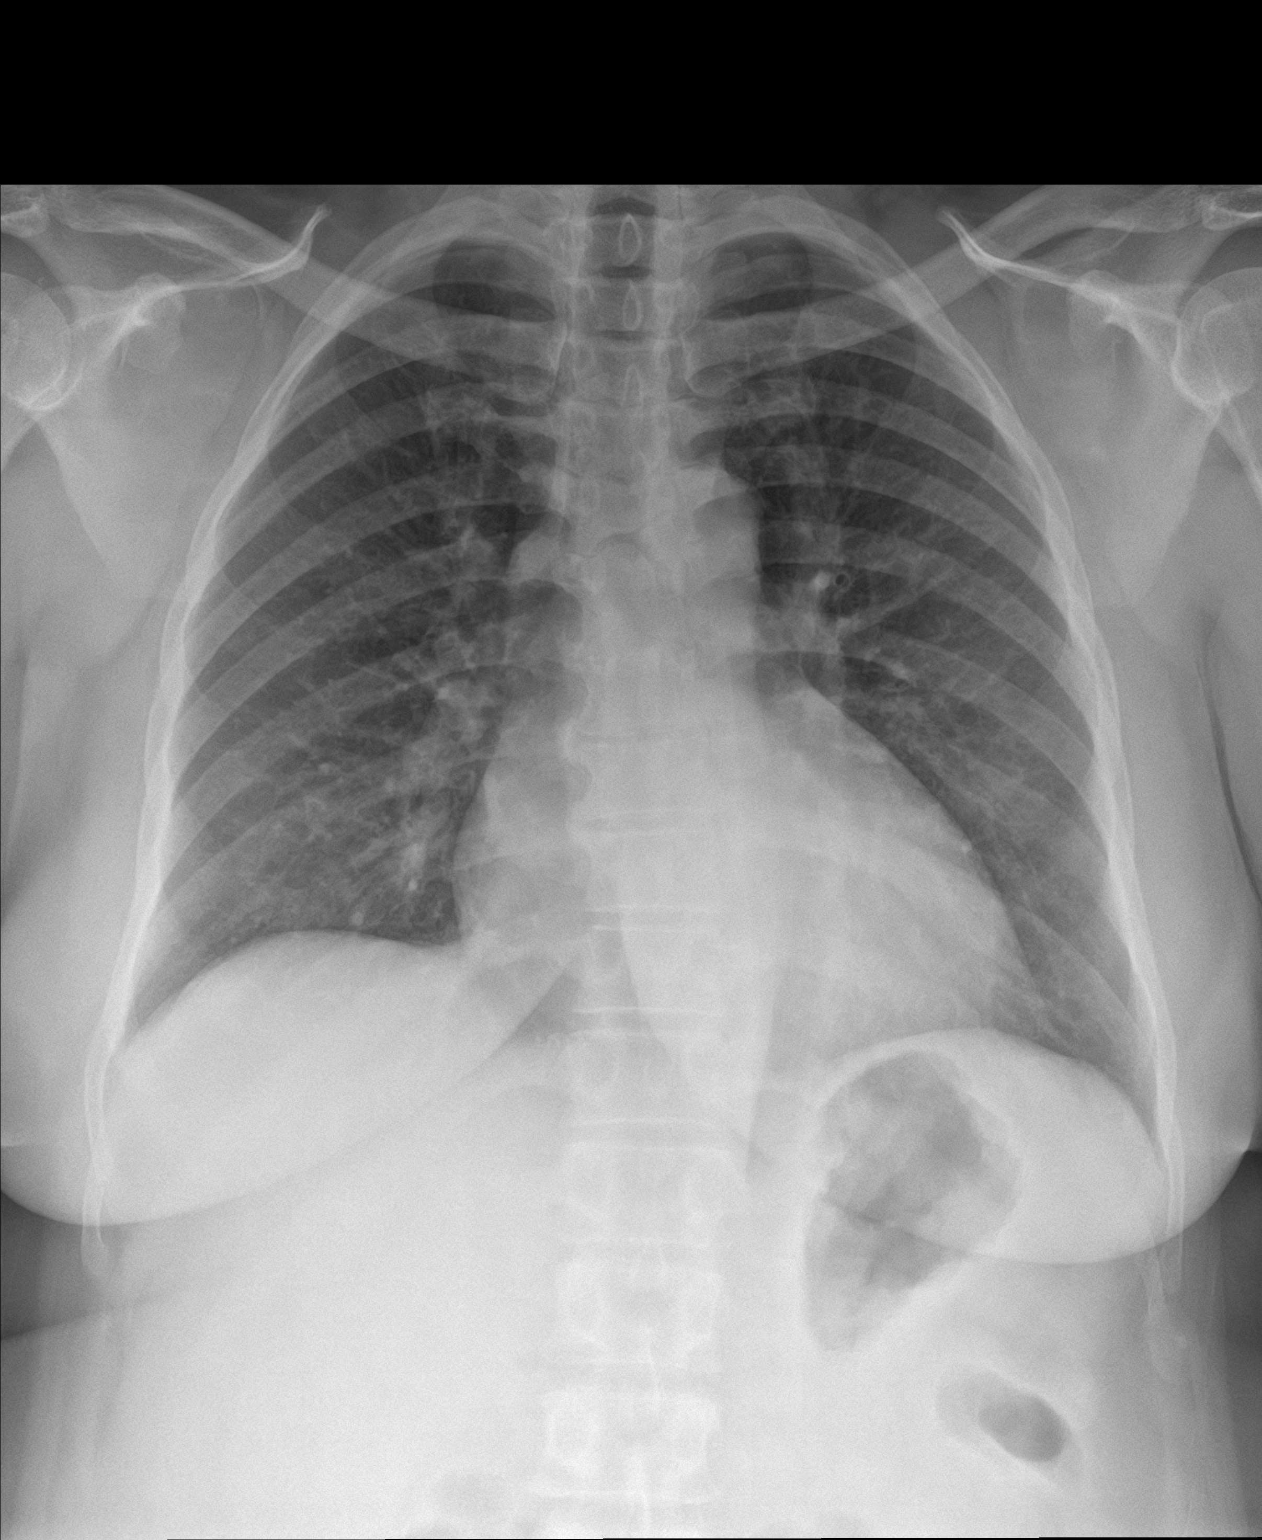

[chest lat]
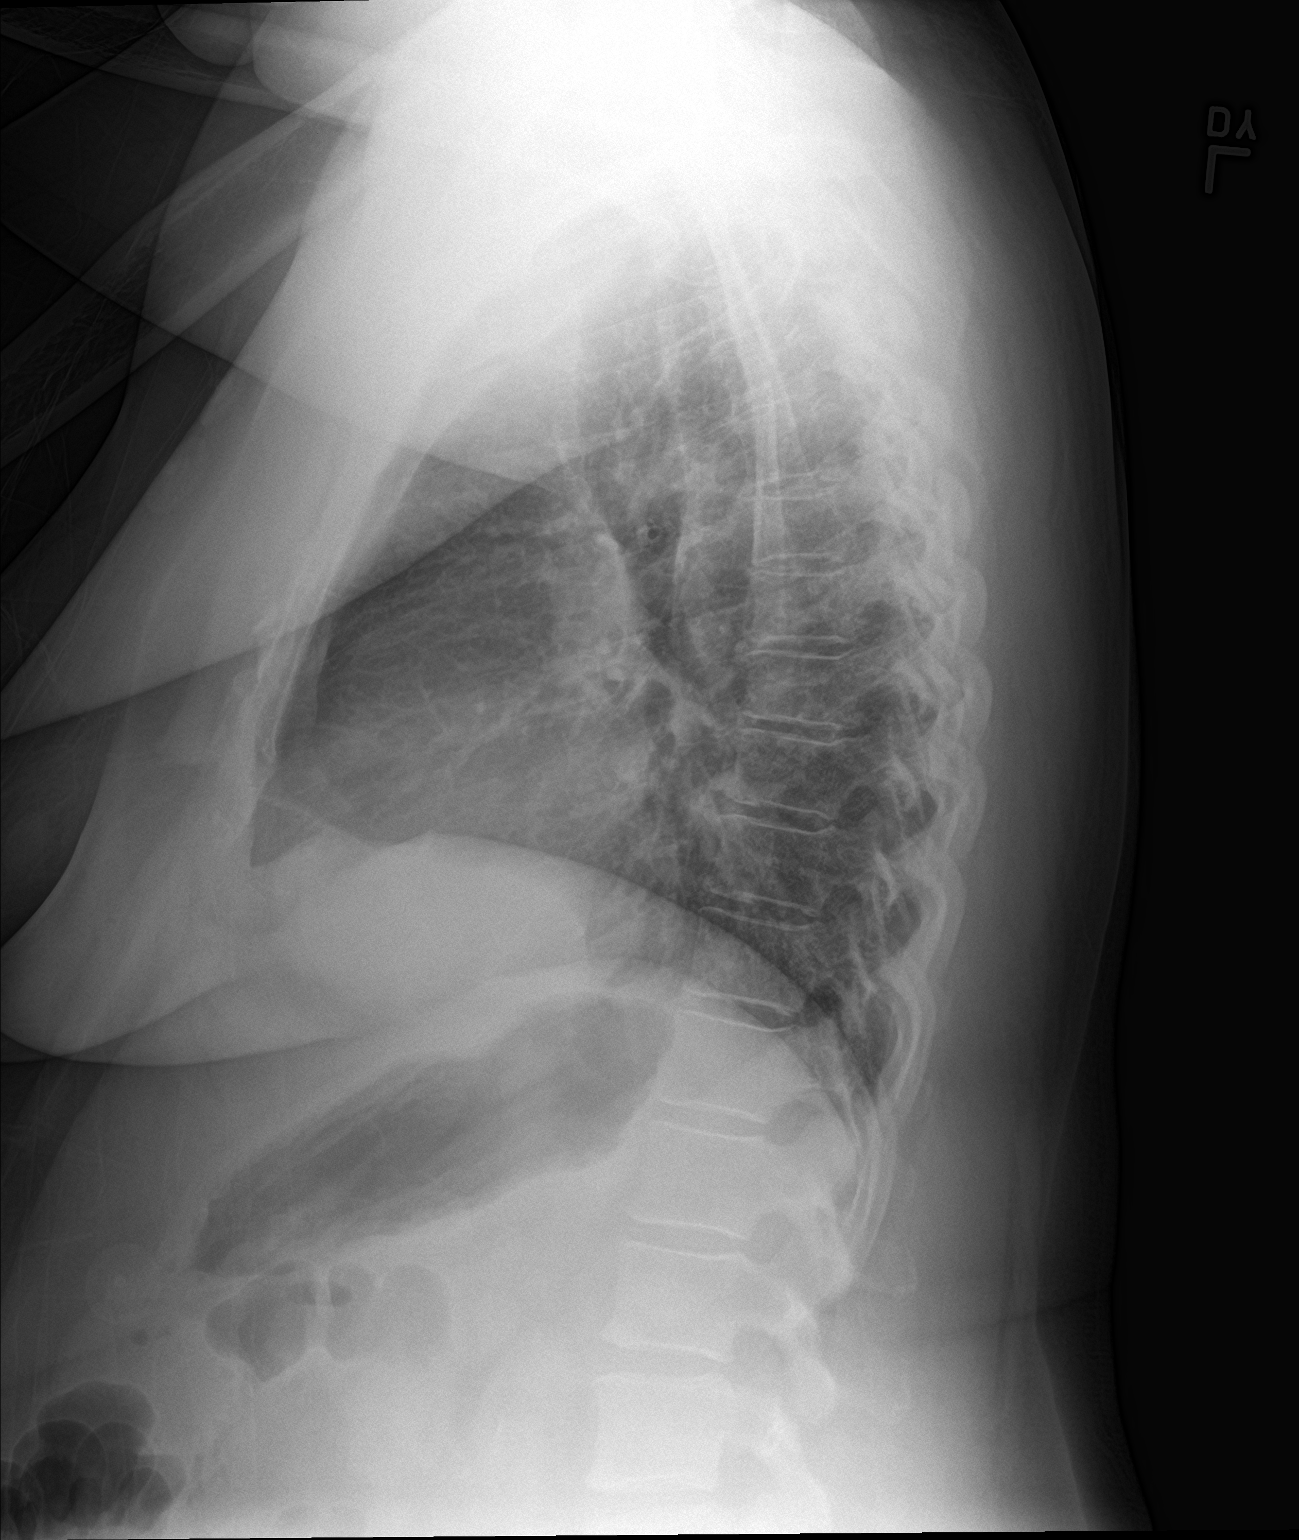

[2 of 2 positions shown; findings below may reference images not displayed]

FINDINGS: Stable cardiac and mediastinal contours. No consolidative pulmonary
opacities. No pleural effusion or pneumothorax. Regional skeleton is
unremarkable.
IMPRESSION: No acute cardiopulmonary process.

## 2021-03-27 NOTE — Progress Notes (Signed)
Patient presents today to be casted for custom molded orthotics. Dr. Allena Katz has been treating patient for antalgic gait, unequal leg length  Impression foam cast was taken. Patient info-  Shoe size: 8 medium  Shoe style: tennis shoes  Weight: 160   Patient will be notified once orthotics arrive in office and reappoint for fitting at that time.   Sent to [employer]

## 2021-04-02 ENCOUNTER — Other Ambulatory Visit: Payer: Self-pay | Admitting: Family Medicine

## 2021-04-02 DIAGNOSIS — J3089 Other allergic rhinitis: Secondary | ICD-10-CM

## 2021-04-16 ENCOUNTER — Encounter: Payer: Self-pay | Admitting: Family Medicine

## 2021-04-26 ENCOUNTER — Ambulatory Visit (INDEPENDENT_AMBULATORY_CARE_PROVIDER_SITE_OTHER): Payer: Managed Care, Other (non HMO)

## 2021-04-26 ENCOUNTER — Other Ambulatory Visit: Payer: Self-pay

## 2021-04-26 DIAGNOSIS — R2689 Other abnormalities of gait and mobility: Secondary | ICD-10-CM

## 2021-04-26 DIAGNOSIS — M2170 Unequal limb length (acquired), unspecified site: Secondary | ICD-10-CM

## 2021-04-26 NOTE — Patient Instructions (Signed)

## 2021-04-26 NOTE — Progress Notes (Signed)
Patient presents for orthotic pick up.  Verbal and written break in and wear instructions given.  Patient will follow up in 4 weeks with Dr if symptoms worsen or fail to improve. 

## 2021-05-08 ENCOUNTER — Other Ambulatory Visit: Payer: Self-pay | Admitting: Family Medicine

## 2021-05-08 DIAGNOSIS — E669 Obesity, unspecified: Secondary | ICD-10-CM

## 2021-05-08 DIAGNOSIS — F411 Generalized anxiety disorder: Secondary | ICD-10-CM

## 2021-05-13 NOTE — Progress Notes (Signed)
Name: [name]   MRN: [id number]    DOB: [birth year]   Date:05/14/2021       Progress Note  Subjective  Chief Complaint  Follow Up  I connected with  [name]  on 05/14/21 at  8:40 AM EDT by a video enabled telemedicine application and verified that I am speaking with the correct person using two identifiers.  I discussed the limitations of evaluation and management by telemedicine and the availability of in person appointments. The patient expressed understanding and agreed to proceed with the virtual visit  Staff also discussed with the patient that there may be a patient responsible charge related to this service. Patient Location: at home  Provider Location: [facility] Additional Individuals present: alone   HPI  S/p Right hip Replacement : she had right hip pain for months , finally got right hip replacement in March 2022 by Dr. Odis Luster however struggling with a limp ( right leg much longer than left ), she is going to pain clinic and only taking pain medication when working part time at [employer] and has to stand. She saw podiatrist and had a heel cup and is walking better, limp has improved. She is pleased now   Obesity: BMI above 30 but right under 35. She was going to the weight loss clinic, she states phentermine worked well for her, but since she has been off medication bp is at goal. She states she stopped going to the weight loss clinic because of cost. She became obese in her early 20's and she has tried multiple diets. Such as Weight Watchers, cabbage diet, Atkins , she loses weight on her diet but gains it back. . She tried Ozempic but stopped on her own , she came in in June  2022 and stated a friend was taking [place] and we started her on medication , she is doing well on medication She had lost another 7 lbs prior to last visit, at home today she states weight is unchanged.   HTN: bp is at goal at [place]  40/5/25 , bp is at goal, continue medications. She denies chest  pain, palpitation or dizziness. Recent bp check at [facility] was 129/80   Anxiety: she continues to feel anxious. Symptoms started when her son went to prison around 2014 for selling drugs, he was  out of jail, but he was in a fight and got arrested and they bailed him out,  he feel his probation for positive drug test, he is back in prison but will be out in December 2022. She has been taking Buspar only once a day but still feels anxious, advised to try taking it twice daily    GERD: she  is back on PPI and symptoms controlled with Omeprazole. No heartburn or indigestion    Pre-diabetes: she denies polyphagia, polyuria or polydipsia. Losing weight, happy with it, last A1C was 6.3 %, she will come in soon to recheck labs.    OSA: also has LVH,  hypertension, interrupted sleep - She went to pulmonologist - Dr. Craige Cotta , has moderate OSA, but never got the supplies.Reminded her to contact pulmonologist again   Hyperlipidemia: taking Rosuvastatin and denies side effects, we will recheck labs   Elevated sed rate: labs done 03/2020, at 44, it was higher after hip replacement, we will recheck level   Patient Active Problem List   Diagnosis Date Noted   History of total hip replacement, right 10/10/2020   OSA (obstructive sleep apnea) 09/08/2019  Ganglion cyst of dorsum of left wrist 02/05/2018   LVH (left ventricular hypertrophy) due to hypertensive disease, without heart failure 09/23/2017   SOBOE (shortness of breath on exertion) 09/23/2017   Hyperlipidemia, mixed 08/28/2017   Hyperglycemia 08/25/2017   Hypercalcemia due to thiazide and vitamin A 08/25/2017   Hypertension 08/18/2017   GAD (generalized anxiety disorder) 08/18/2017   GERD (gastroesophageal reflux disease) 08/18/2017   Panic attack 08/18/2017   Tobacco use 08/18/2017   Systolic ejection murmur 08/18/2017    Past Surgical History:  Procedure Laterality Date   ABDOMINAL HYSTERECTOMY     Complete Hysterectomy   CHOLECYSTECTOMY      GANGLION CYST EXCISION Left 03/01/2018   Procedure: REMOVAL GANGLION OF WRIST;  Surgeon: Deeann Saint, MD;  Location: [facility] ORS;  Service: Orthopedics;  Laterality: Left;   TOTAL HIP ARTHROPLASTY Right 10/10/2020   Procedure: TOTAL HIP ARTHROPLASTY ANTERIOR APPROACH;  Surgeon: Lyndle Herrlich, MD;  Location: [facility] ORS;  Service: Orthopedics;  Laterality: Right;    Family History  Problem Relation Age of Onset   Hypertension Mother    Stroke Father    Hypertension Father    Hypertension Sister    Breast cancer Neg Hx       Current Outpatient Medications:    busPIRone (BUSPAR) 5 MG tablet, TAKE 1 TABLET(5 MG) BY MOUTH TWICE DAILY, Disp: 180 tablet, Rfl: 0   fluticasone (FLONASE) 50 MCG/ACT nasal spray, SHAKE LIQUID AND USE 2 SPRAYS IN EACH NOSTRIL DAILY AS NEEDED FOR ALLERGIES, Disp: 48 g, Rfl: 0   gabapentin (NEURONTIN) 300 MG capsule, Take 300 mg by mouth 2 (two) times daily., Disp: , Rfl:    Olmesartan-amLODIPine-HCTZ 40-5-25 MG TABS, Take 1 tablet by mouth every morning., Disp: 90 tablet, Rfl: 1   omeprazole (PRILOSEC) 40 MG capsule, Take 1 capsule (40 mg total) by mouth daily as needed (heartburn)., Disp: 90 capsule, Rfl: 1   rosuvastatin (CRESTOR) 10 MG tablet, Take 1 tablet (10 mg total) by mouth every morning., Disp: 90 tablet, Rfl: 1   SAXENDA 18 MG/3ML SOPN, ADMINISTER 3 MG UNDER THE SKIN DAILY WITH BREAKFAST, Disp: 15 mL, Rfl: 0   FLUoxetine (PROZAC) 20 MG capsule, Take 1 capsule (20 mg total) by mouth daily., Disp: 90 capsule, Rfl: 1   oxyCODONE-acetaminophen (PERCOCET) 10-325 MG tablet, Take 1 tablet by mouth daily as needed., Disp: , Rfl:   Allergies  Allergen Reactions   Penicillins Itching and Swelling    Has patient had a PCN reaction causing immediate rash, facial/tongue/throat swelling, SOB or lightheadedness with hypotension: No Has patient had a PCN reaction causing severe rash involving mucus membranes or skin necrosis: No Has patient had a PCN reaction that required  hospitalization: No Has patient had a PCN reaction occurring within the last 10 years: No If all of the above answers are "NO", then may proceed with Cephalosporin use.     I personally reviewed active problem list, medication list, allergies, family history, social history, health maintenance with the patient/caregiver today.   ROS  Constitutional: Negative for fever or weight change.  Respiratory: positive  for mild cough ( resolving) but no  shortness of breath.   Cardiovascular: Negative for chest pain or palpitations.  Gastrointestinal: Negative for abdominal pain, no bowel changes.  Musculoskeletal: positive  for gait problem or joint swelling.  Skin: Negative for rash.  Neurological: Negative for dizziness or headache.  No other specific complaints in a complete review of systems (except as listed in HPI above).  Objective  Virtual encounter, vitals not obtained.  There is no height or weight on file to calculate BMI.  Physical Exam  Awake, alert and oriented   PHQ2/9: Depression screen [facility] 2/9 05/14/2021 03/21/2021 02/08/2021 10/08/2020 07/06/2020  Decreased Interest 0 1 2 0 0  Down, Depressed, Hopeless 0 1 2 0 0  PHQ - 2 Score 0 2 4 0 0  Altered sleeping 0 2 1 - -  Tired, decreased energy 0 0 0 - -  Change in appetite 0 0 0 - -  Feeling bad or failure about yourself  0 0 0 - -  Trouble concentrating 0 0 0 - -  Moving slowly or fidgety/restless 0 0 0 - -  Suicidal thoughts 0 0 0 - -  PHQ-9 Score 0 4 5 - -  Difficult doing work/chores - Not difficult at all - - -  Some recent data might be hidden   PHQ-2/9 Result is negative.    Fall Risk: Fall Risk  05/14/2021 03/21/2021 02/08/2021 10/08/2020 07/06/2020  Falls in the past year? 0 0 0 0 0  Number falls in past yr: 0 0 0 0 0  Injury with Fall? 0 0 0 0 0  Risk for fall due to : No Fall Risks - - - -  Follow up Falls prevention discussed - - - -     Assessment & Plan  1. Benign essential hypertension  -  CBC with Differential/Platelet - COMPLETE METABOLIC PANEL WITH GFR  2. Pre-diabetes  - Hemoglobin A1c  3. GAD (generalized anxiety disorder)  - FLUoxetine (PROZAC) 20 MG capsule; Take 1 capsule (20 mg total) by mouth daily.  Dispense: 90 capsule; Refill: 1  4. OSA (obstructive sleep apnea)  Contacted Dr. Craige Cotta and he will reach out to the patient   5. Obesity (BMI 30.0-34.9)  Discussed with the patient the risk posed by an increased BMI. Discussed importance of portion control, calorie counting and at least 150 minutes of physical activity weekly. Avoid sweet beverages and drink more water. Eat at least 6 servings of fruit and vegetables daily    6. Hyperglycemia  - Hemoglobin A1c  7. Pure hypercholesterolemia  - Lipid panel  8. Primary osteoarthritis of right hip  Doing better since surgery   9. History of right hip replacement   10. Gastroesophageal reflux disease without esophagitis  Taking prn medication now and doing well  11. LVH (left ventricular hypertrophy) due to hypertensive disease, without heart failure   12. Positive for microalbuminuria  - Microalbumin / creatinine urine ratio  13. Elevated sed rate  - Sedimentation rate   I discussed the assessment and treatment plan with the patient. The patient was provided an opportunity to ask questions and all were answered. The patient agreed with the plan and demonstrated an understanding of the instructions.  The patient was advised to call back or seek an in-person evaluation if the symptoms worsen or if the condition fails to improve as anticipated.  I provided 25  minutes of non-face-to-face time during this encounter.

## 2021-05-14 ENCOUNTER — Encounter: Payer: Self-pay | Admitting: Family Medicine

## 2021-05-14 ENCOUNTER — Telehealth (INDEPENDENT_AMBULATORY_CARE_PROVIDER_SITE_OTHER): Payer: Managed Care, Other (non HMO) | Admitting: Family Medicine

## 2021-05-14 DIAGNOSIS — Z96641 Presence of right artificial hip joint: Secondary | ICD-10-CM

## 2021-05-14 DIAGNOSIS — F411 Generalized anxiety disorder: Secondary | ICD-10-CM

## 2021-05-14 DIAGNOSIS — K219 Gastro-esophageal reflux disease without esophagitis: Secondary | ICD-10-CM

## 2021-05-14 DIAGNOSIS — G4733 Obstructive sleep apnea (adult) (pediatric): Secondary | ICD-10-CM

## 2021-05-14 DIAGNOSIS — R700 Elevated erythrocyte sedimentation rate: Secondary | ICD-10-CM

## 2021-05-14 DIAGNOSIS — E7800 Pure hypercholesterolemia, unspecified: Secondary | ICD-10-CM

## 2021-05-14 DIAGNOSIS — E669 Obesity, unspecified: Secondary | ICD-10-CM

## 2021-05-14 DIAGNOSIS — M1611 Unilateral primary osteoarthritis, right hip: Secondary | ICD-10-CM

## 2021-05-14 DIAGNOSIS — E66811 Obesity, class 1: Secondary | ICD-10-CM

## 2021-05-14 DIAGNOSIS — R739 Hyperglycemia, unspecified: Secondary | ICD-10-CM

## 2021-05-14 DIAGNOSIS — I10 Essential (primary) hypertension: Secondary | ICD-10-CM

## 2021-05-14 DIAGNOSIS — R809 Proteinuria, unspecified: Secondary | ICD-10-CM

## 2021-05-14 DIAGNOSIS — R7303 Prediabetes: Secondary | ICD-10-CM | POA: Diagnosis not present

## 2021-05-14 DIAGNOSIS — I119 Hypertensive heart disease without heart failure: Secondary | ICD-10-CM

## 2021-05-14 MED ORDER — FLUOXETINE HCL 20 MG PO CAPS: 20.0000 mg | ORAL_CAPSULE | Freq: Every day | ORAL | 1 refills | Status: DC

## 2021-05-23 ENCOUNTER — Telehealth: Payer: Self-pay

## 2021-05-23 NOTE — Telephone Encounter (Signed)
-----   Message from [name], MD sent at 05/14/2021  9:00 AM EDT ----- Please schedule [name] for a follow up with me or NP to discuss current status of sleep apnea therapy.  Thanks.  V

## 2021-05-23 NOTE — Telephone Encounter (Signed)
ATC patient. No VM available.  

## 2021-05-24 ENCOUNTER — Telehealth: Payer: Self-pay

## 2021-05-24 NOTE — Telephone Encounter (Signed)
-----   Message from Vineet Sood, MD sent at 05/14/2021  9:00 AM EDT ----- Please schedule [name] for a follow up with me or NP to discuss current status of sleep apnea therapy.  Thanks.  V  

## 2021-05-24 NOTE — Telephone Encounter (Signed)
ATC patient to get her set up for appointment with Dr. Craige Cotta or APP but it went straight to her voicemail and mailbox was full so unable to leave message. Will try again

## 2021-05-27 NOTE — Telephone Encounter (Signed)
Appt scheduled for 06/12/2021 at 3:00. Patient is aware and voiced her understanding.  Nothing further needed.

## 2021-05-28 ENCOUNTER — Ambulatory Visit: Payer: Managed Care, Other (non HMO) | Admitting: Podiatry

## 2021-06-12 ENCOUNTER — Ambulatory Visit: Payer: Managed Care, Other (non HMO) | Admitting: Primary Care

## 2021-06-12 NOTE — Progress Notes (Deleted)
@Patient  ID: , female    DOB: 1965-09-22, 55 y.o.   MRN: [id number]  No chief complaint on file.   Referring provider: [id number], MD  HPI: 55 year old female, current everyday smoker. PMH significant for OSA, HTN, GERD. 09/05/19 >> AHI 18.2, SpO2 low 87%  06/12/2021- interim hx  Patient presents today for overdue follow-up OSA. Last seen in February 2021 where it was recommended to start CPAP therapy.     Allergies  Allergen Reactions   Penicillins Itching and Swelling    Has patient had a PCN reaction causing immediate rash, facial/tongue/throat swelling, SOB or lightheadedness with hypotension: No Has patient had a PCN reaction causing severe rash involving mucus membranes or skin necrosis: No Has patient had a PCN reaction that required hospitalization: No Has patient had a PCN reaction occurring within the last 10 years: No If all of the above answers are "NO", then may proceed with Cephalosporin use.     Immunization History  Administered Date(s) Administered   Influenza,inj,Quad PF,6+ Mos 08/18/2017, 04/20/2018, 04/10/2020   PFIZER(Purple Top)SARS-COV-2 Vaccination 09/22/2019, 10/20/2019   PNEUMOCOCCAL CONJUGATE-20 02/08/2021   Tdap 08/18/2017   Zoster Recombinat (Shingrix) 02/08/2021    Past Medical History:  Diagnosis Date   Acid reflux    Arthritis    GAD (generalized anxiety disorder)    Hypertension    OSA (obstructive sleep apnea) 09/08/2019   does not have a cpap   Tobacco use     Tobacco History: Social History   Tobacco Use  Smoking Status Every Day   Packs/day: 0.50   Years: 18.00   Pack years: 9.00   Types: Cigarettes   Start date: 08/19/1999  Smokeless Tobacco Never   Ready to quit: Not Answered Counseling given: Not Answered   Outpatient Medications Prior to Visit  Medication Sig Dispense Refill   busPIRone (BUSPAR) 5 MG tablet TAKE 1 TABLET(5 MG) BY MOUTH TWICE DAILY 180 tablet 0   FLUoxetine (PROZAC) 20 MG  capsule Take 1 capsule (20 mg total) by mouth daily. 90 capsule 1   fluticasone (FLONASE) 50 MCG/ACT nasal spray SHAKE LIQUID AND USE 2 SPRAYS IN EACH NOSTRIL DAILY AS NEEDED FOR ALLERGIES 48 g 0   gabapentin (NEURONTIN) 300 MG capsule Take 300 mg by mouth 2 (two) times daily.     Olmesartan-amLODIPine-HCTZ 40-5-25 MG TABS Take 1 tablet by mouth every morning. 90 tablet 1   omeprazole (PRILOSEC) 40 MG capsule Take 1 capsule (40 mg total) by mouth daily as needed (heartburn). 90 capsule 1   oxyCODONE-acetaminophen (PERCOCET) 10-325 MG tablet Take 1 tablet by mouth daily as needed.     rosuvastatin (CRESTOR) 10 MG tablet Take 1 tablet (10 mg total) by mouth every morning. 90 tablet 1   SAXENDA 18 MG/3ML SOPN ADMINISTER 3 MG UNDER THE SKIN DAILY WITH BREAKFAST 15 mL 0   No facility-administered medications prior to visit.      Review of Systems  Review of Systems   Physical Exam  There were no vitals taken for this visit. Physical Exam   Lab Results:  CBC    Component Value Date/Time   WBC 9.2 10/15/2020 1454   RBC 3.97 10/15/2020 1454   HGB 10.6 (L) 10/15/2020 1454   HGB 14.0 04/10/2020 1641   HCT 34.2 (L) 10/15/2020 1454   HCT 42.7 04/10/2020 1641   PLT 363 10/15/2020 1454   PLT 325 04/10/2020 1641   MCV 86.1 10/15/2020 1454   MCV 82 04/10/2020  1641   MCV 83 03/21/2013 2238   MCH 26.7 10/15/2020 1454   MCHC 31.0 10/15/2020 1454   RDW 13.3 10/15/2020 1454   RDW 13.4 04/10/2020 1641   RDW 14.7 (H) 03/21/2013 2238   LYMPHSABS 3.7 10/15/2020 1454   LYMPHSABS 3.7 (H) 04/10/2020 1641   MONOABS 0.6 10/15/2020 1454   EOSABS 0.1 10/15/2020 1454   EOSABS 0.1 04/10/2020 1641   BASOSABS 0.0 10/15/2020 1454   BASOSABS 0.0 04/10/2020 1641    BMET    Component Value Date/Time   NA 139 10/15/2020 1454   NA 144 04/10/2020 1641   NA 139 03/21/2013 2238   K 3.9 10/15/2020 1454   K 3.7 03/21/2013 2238   CL 104 10/15/2020 1454   CL 105 03/21/2013 2238   CO2 26 10/15/2020  1454   CO2 32 03/21/2013 2238   GLUCOSE 108 (H) 10/15/2020 1454   GLUCOSE 117 (H) 03/21/2013 2238   BUN 23 (H) 10/15/2020 1454   BUN 17 04/10/2020 1641   BUN 22 (H) 03/21/2013 2238   CREATININE 0.76 10/15/2020 1454   CREATININE 0.73 12/22/2018 0938   CALCIUM 9.4 10/15/2020 1454   CALCIUM 9.8 03/22/2013 0409   GFRNONAA >60 10/15/2020 1454   GFRNONAA 95 12/22/2018 0938   GFRAA 92 04/10/2020 1641   GFRAA 111 12/22/2018 0938    BNP No results found for: BNP  ProBNP No results found for: PROBNP  Imaging: No results found.   Assessment & Plan:   No problem-specific Assessment & Plan notes found for this encounter.     Glenford Bayley, NP 06/12/2021

## 2021-06-18 ENCOUNTER — Other Ambulatory Visit: Payer: Self-pay | Admitting: Family Medicine

## 2021-06-18 DIAGNOSIS — I10 Essential (primary) hypertension: Secondary | ICD-10-CM

## 2021-08-11 ENCOUNTER — Other Ambulatory Visit: Payer: Self-pay | Admitting: Family Medicine

## 2021-08-11 DIAGNOSIS — F411 Generalized anxiety disorder: Secondary | ICD-10-CM

## 2021-08-11 NOTE — Telephone Encounter (Signed)
Requested Prescriptions  Pending Prescriptions Disp Refills   busPIRone (BUSPAR) 5 MG tablet [Pharmacy Med Name: BUSPIRONE 5MG  TABLETS] 180 tablet 0    Sig: TAKE 1 TABLET(5 MG) BY MOUTH TWICE DAILY     Psychiatry: Anxiolytics/Hypnotics - Non-controlled Passed - 08/11/2021 11:15 AM      Passed - Valid encounter within last 6 months    Recent Outpatient Visits          2 months ago Benign essential hypertension   [facility] Robbins, Leugnies, MD   4 months ago Gastroesophageal reflux disease without esophagitis   [facility], NP   6 months ago Pre-diabetes   [facility], MD   9 months ago No-show for appointment   [facility] M, M   10 months ago Gastroesophageal reflux disease without esophagitis   [facility], MD      Future Appointments            In 3 days Alba Cory, MD [facility]

## 2021-08-13 NOTE — Progress Notes (Signed)
Name: [name]   MRN: [id number]    DOB: 10/19/1965   Date:08/14/2021       Progress Note  Subjective  Chief Complaint  Follow Up  HPI  S/p Right hip Replacement : she had right hip pain for months , finally got right hip replacement in March 2022 by Dr. Odis LusterBowers however struggling with a limp ( right leg much longer than left ), she is going to pain clinic, she takes Oxycodone usually once a day. Pain is described as aching and current pain is 1/10. Pain goes up to 5/10 She saw podiatrist and had a heel cup and is walking better, limp has improved.    Obesity: BMI above 30 but right under 35. She was going to the weight loss clinic, she states phentermine worked well for her, but since she has been off medication bp is at goal. She states she stopped going to the weight loss clinic because of cost. She became obese in her early 20's and she has tried multiple diets. Such as Weight Watchers, cabbage diet, Atkins , she loses weight on her diet but gains it back. . She tried Ozempic but stopped on her own , she came in in June  2022 and stated a friend was taking Saxenda  so we gave it to her , she is taking medication but she gained 4 lbs since last visit. She states works full time at [employer] and part time at [employer] and eats at [employer]. Discussed packing lunch and snacks.She states she splurged during the holidays, but she is ready to resume her diet. She is still walking her dog twice daily.   HTN: bp is at goal at [place]  40/5/25 , bp is at goal, continue medications. She denies chest pain, palpitation or dizziness. She states bp at home has been well controlled    Anxiety: she continues to feel anxious. Symptoms started when her son went to prison around 2014 for selling drugs, he was  out of jail, but he was in a fight and got arrested and they bailed him out,  he fail his probation for positive drug test, he is back in prison, he is coming out of jail next week and has to stay with him for  30 days. She has been taking Buspar only once a day but still feels anxious, she states Buspar helps but her mind still races.  GERD: she  is back on PPI and symptoms controlled with Omeprazole. No heartburn or indigestion    Pre-diabetes: she denies polyphagia, polyuria or polydipsia. Losing weight, happy with it, last A1C was 6.3 %, she will have labs done today    OSA: also has LVH,  hypertension, interrupted sleep - She went to pulmonologist - Dr. Craige CottaSood , has moderate OSA, she still does not use her CPAP, advised again to contact pulmonologist   Hyperlipidemia: taking Rosuvastatin and denies side effects, she needs to have labs done   Elevated sed rate: labs done 03/2020, at 44, it was higher after hip replacement, she did not have labs done in October but willing to go now   Patient Active Problem List   Diagnosis Date Noted   History of total hip replacement, right 10/10/2020   OSA (obstructive sleep apnea) 09/08/2019   Ganglion cyst of dorsum of left wrist 02/05/2018   LVH (left ventricular hypertrophy) due to hypertensive disease, without heart failure 09/23/2017   SOBOE (shortness of breath on exertion) 09/23/2017   Hyperlipidemia, mixed  08/28/2017   Hyperglycemia 08/25/2017   Hypercalcemia due to thiazide and vitamin A 08/25/2017   Hypertension 08/18/2017   GAD (generalized anxiety disorder) 08/18/2017   GERD (gastroesophageal reflux disease) 08/18/2017   Panic attack 08/18/2017   Tobacco use 08/18/2017   Systolic ejection murmur 08/18/2017    Past Surgical History:  Procedure Laterality Date   ABDOMINAL HYSTERECTOMY     Complete Hysterectomy   CHOLECYSTECTOMY     GANGLION CYST EXCISION Left 03/01/2018   Procedure: REMOVAL GANGLION OF WRIST;  Surgeon: Deeann Saint, MD;  Location: [facility] ORS;  Service: Orthopedics;  Laterality: Left;   TOTAL HIP ARTHROPLASTY Right 10/10/2020   Procedure: TOTAL HIP ARTHROPLASTY ANTERIOR APPROACH;  Surgeon: Lyndle Herrlich, MD;  Location:  [facility] ORS;  Service: Orthopedics;  Laterality: Right;    Family History  Problem Relation Age of Onset   Hypertension Mother    Stroke Father    Hypertension Father    Hypertension Sister    Breast cancer Neg Hx     Social History   Tobacco Use   Smoking status: Every Day    Packs/day: 0.50    Years: 18.00    Pack years: 9.00    Types: Cigarettes    Start date: 08/19/1999   Smokeless tobacco: Never  Substance Use Topics   Alcohol use: Yes    Alcohol/week: 1.0 standard drink    Types: 1 Glasses of wine per week    Comment: rare on Saturdays      Current Outpatient Medications:    busPIRone (BUSPAR) 5 MG tablet, TAKE 1 TABLET(5 MG) BY MOUTH TWICE DAILY, Disp: 180 tablet, Rfl: 0   FLUoxetine (PROZAC) 20 MG capsule, Take 1 capsule (20 mg total) by mouth daily., Disp: 90 capsule, Rfl: 1   fluticasone (FLONASE) 50 MCG/ACT nasal spray, SHAKE LIQUID AND USE 2 SPRAYS IN EACH NOSTRIL DAILY AS NEEDED FOR ALLERGIES, Disp: 48 g, Rfl: 0   gabapentin (NEURONTIN) 300 MG capsule, Take 300 mg by mouth 2 (two) times daily., Disp: , Rfl:    Olmesartan-amLODIPine-HCTZ 40-5-25 MG TABS, Take 1 tablet by mouth every morning., Disp: 90 tablet, Rfl: 1   omeprazole (PRILOSEC) 40 MG capsule, Take 1 capsule (40 mg total) by mouth daily as needed (heartburn)., Disp: 90 capsule, Rfl: 1   oxyCODONE-acetaminophen (PERCOCET) 10-325 MG tablet, Take 1 tablet by mouth daily as needed., Disp: , Rfl:    rosuvastatin (CRESTOR) 10 MG tablet, Take 1 tablet (10 mg total) by mouth every morning., Disp: 90 tablet, Rfl: 1   SAXENDA 18 MG/3ML SOPN, ADMINISTER 3 MG UNDER THE SKIN DAILY WITH BREAKFAST, Disp: 15 mL, Rfl: 0  Allergies  Allergen Reactions   Penicillins Itching and Swelling    Has patient had a PCN reaction causing immediate rash, facial/tongue/throat swelling, SOB or lightheadedness with hypotension: No Has patient had a PCN reaction causing severe rash involving mucus membranes or skin necrosis: No Has  patient had a PCN reaction that required hospitalization: No Has patient had a PCN reaction occurring within the last 10 years: No If all of the above answers are "NO", then may proceed with Cephalosporin use.     I personally reviewed active problem list, medication list, allergies, family history, social history, health maintenance with the patient/caregiver today.   ROS  Constitutional: Negative for fever , positive for  weight change.  Respiratory: Negative for cough and shortness of breath.   Cardiovascular: Negative for chest pain or palpitations.  Gastrointestinal: Negative for abdominal pain, no  bowel changes.  Musculoskeletal: Negative for gait problem or joint swelling.  Skin: Negative for rash.  Neurological: Negative for dizziness or headache.  No other specific complaints in a complete review of systems (except as listed in HPI above).   Objective  Vitals:   08/14/21 0859  BP: 132/66  Pulse: 91  Resp: 16  Temp: 98.1 F (36.7 C)  SpO2: 99%  Weight: 169 lb (76.7 kg)  Height: 5\' 1"  (1.549 m)    Body mass index is 31.93 kg/m.  Physical Exam  Constitutional: Patient appears well-developed and well-nourished. Obese  No distress.  HEENT: head atraumatic, normocephalic, pupils equal and reactive to light, neck supple Cardiovascular: Normal rate, regular rhythm and normal heart sounds.  No murmur heard. No BLE edema. Pulmonary/Chest: Effort normal and breath sounds normal. No respiratory distress. Abdominal: Soft.  There is no tenderness. Psychiatric: Patient has a normal mood and affect. behavior is normal. Judgment and thought content normal.   PHQ2/9: Depression screen [facility] 2/9 08/14/2021 05/14/2021 03/21/2021 02/08/2021 10/08/2020  Decreased Interest 0 0 1 2 0  Down, Depressed, Hopeless 0 0 1 2 0  PHQ - 2 Score 0 0 2 4 0  Altered sleeping 0 0 2 1 -  Tired, decreased energy 0 0 0 0 -  Change in appetite 0 0 0 0 -  Feeling bad or failure about yourself  0 0 0 0  -  Trouble concentrating 0 0 0 0 -  Moving slowly or fidgety/restless 0 0 0 0 -  Suicidal thoughts 0 0 0 0 -  PHQ-9 Score 0 0 4 5 -  Difficult doing work/chores - - Not difficult at all - -  Some recent data might be hidden    phq 9 is negative   Fall Risk: Fall Risk  08/14/2021 05/14/2021 03/21/2021 02/08/2021 10/08/2020  Falls in the past year? 0 0 0 0 0  Number falls in past yr: 0 0 0 0 0  Injury with Fall? 0 0 0 0 0  Risk for fall due to : No Fall Risks No Fall Risks - - -  Follow up Falls prevention discussed Falls prevention discussed - - -      Functional Status Survey: Is the patient deaf or have difficulty hearing?: No Does the patient have difficulty seeing, even when wearing glasses/contacts?: No Does the patient have difficulty concentrating, remembering, or making decisions?: No Does the patient have difficulty walking or climbing stairs?: No Does the patient have difficulty dressing or bathing?: No Does the patient have difficulty doing errands alone such as visiting a doctor's office or shopping?: No    Assessment & Plan  1. Benign essential hypertension  - Olmesartan-amLODIPine-HCTZ 40-5-25 MG TABS; Take 1 tablet by mouth every morning.  Dispense: 90 tablet; Refill: 1  2. Pre-diabetes   3. Obesity (BMI 30.0-34.9)  - Liraglutide -Weight Management (SAXENDA) 18 MG/3ML SOPN; ADMINISTER 3 MG UNDER THE SKIN DAILY WITH BREAKFAST  Dispense: 15 mL; Refill: 0  4. Colon cancer screening  - Cologuard  5. Needs flu shot  - Flu Vaccine QUAD 6+ mos PF IM (Fluarix Quad PF)  6. Need for shingles vaccine  - Varicella-zoster vaccine IM  7. History of right hip replacement   8. Primary osteoarthritis of right hip   9. Pure hypercholesterolemia  - rosuvastatin (CRESTOR) 10 MG tablet; Take 1 tablet (10 mg total) by mouth every morning.  Dispense: 90 tablet; Refill: 1  10. OSA (obstructive sleep apnea)   11.  Gastroesophageal reflux disease without  esophagitis  - omeprazole (PRILOSEC) 40 MG capsule; Take 1 capsule (40 mg total) by mouth daily as needed (heartburn).  Dispense: 90 capsule; Refill: 1  12. Positive for microalbuminuria   13. LVH (left ventricular hypertrophy) due to hypertensive disease, without heart failure

## 2021-08-14 ENCOUNTER — Ambulatory Visit (INDEPENDENT_AMBULATORY_CARE_PROVIDER_SITE_OTHER): Payer: Managed Care, Other (non HMO) | Admitting: Family Medicine

## 2021-08-14 ENCOUNTER — Encounter: Payer: Self-pay | Admitting: Family Medicine

## 2021-08-14 ENCOUNTER — Other Ambulatory Visit: Payer: Self-pay

## 2021-08-14 VITALS — BP 132/66 | HR 91 | Temp 98.1°F | Resp 16 | Ht 61.0 in | Wt 169.0 lb

## 2021-08-14 DIAGNOSIS — Z23 Encounter for immunization: Secondary | ICD-10-CM

## 2021-08-14 DIAGNOSIS — Z96641 Presence of right artificial hip joint: Secondary | ICD-10-CM

## 2021-08-14 DIAGNOSIS — R700 Elevated erythrocyte sedimentation rate: Secondary | ICD-10-CM

## 2021-08-14 DIAGNOSIS — E669 Obesity, unspecified: Secondary | ICD-10-CM | POA: Diagnosis not present

## 2021-08-14 DIAGNOSIS — Z1211 Encounter for screening for malignant neoplasm of colon: Secondary | ICD-10-CM | POA: Diagnosis not present

## 2021-08-14 DIAGNOSIS — I10 Essential (primary) hypertension: Secondary | ICD-10-CM

## 2021-08-14 DIAGNOSIS — I119 Hypertensive heart disease without heart failure: Secondary | ICD-10-CM

## 2021-08-14 DIAGNOSIS — E7800 Pure hypercholesterolemia, unspecified: Secondary | ICD-10-CM

## 2021-08-14 DIAGNOSIS — E66811 Obesity, class 1: Secondary | ICD-10-CM

## 2021-08-14 DIAGNOSIS — M1611 Unilateral primary osteoarthritis, right hip: Secondary | ICD-10-CM

## 2021-08-14 DIAGNOSIS — R809 Proteinuria, unspecified: Secondary | ICD-10-CM

## 2021-08-14 DIAGNOSIS — K219 Gastro-esophageal reflux disease without esophagitis: Secondary | ICD-10-CM

## 2021-08-14 DIAGNOSIS — G4733 Obstructive sleep apnea (adult) (pediatric): Secondary | ICD-10-CM

## 2021-08-14 DIAGNOSIS — R7303 Prediabetes: Secondary | ICD-10-CM | POA: Diagnosis not present

## 2021-08-14 MED ORDER — ROSUVASTATIN CALCIUM 10 MG PO TABS: 10.0000 mg | ORAL_TABLET | ORAL | 1 refills | Status: DC

## 2021-08-14 MED ORDER — SAXENDA 18 MG/3ML ~~LOC~~ SOPN: PEN_INJECTOR | SUBCUTANEOUS | 0 refills | Status: DC

## 2021-08-14 MED ORDER — OMEPRAZOLE 40 MG PO CPDR: 40.0000 mg | DELAYED_RELEASE_CAPSULE | Freq: Every day | ORAL | 1 refills | Status: DC | PRN

## 2021-08-14 MED ORDER — OLMESARTAN-AMLODIPINE-HCTZ 40-5-25 MG PO TABS: 1.0000 | ORAL_TABLET | ORAL | 1 refills | Status: DC

## 2021-08-18 LAB — COMPREHENSIVE METABOLIC PANEL
ALT: 19 IU/L (ref 0–32)
AST: 18 IU/L (ref 0–40)
Albumin/Globulin Ratio: 2 (ref 1.2–2.2)
Albumin: 5.1 g/dL — ABNORMAL HIGH (ref 3.8–4.9)
Alkaline Phosphatase: 66 IU/L (ref 44–121)
BUN/Creatinine Ratio: 21 (ref 9–23)
BUN: 17 mg/dL (ref 6–24)
Bilirubin Total: 0.2 mg/dL (ref 0.0–1.2)
CO2: 26 mmol/L (ref 20–29)
Calcium: 10.3 mg/dL — ABNORMAL HIGH (ref 8.7–10.2)
Chloride: 103 mmol/L (ref 96–106)
Creatinine, Ser: 0.82 mg/dL (ref 0.57–1.00)
Globulin, Total: 2.5 g/dL (ref 1.5–4.5)
Glucose: 107 mg/dL — ABNORMAL HIGH (ref 70–99)
Potassium: 4.9 mmol/L (ref 3.5–5.2)
Sodium: 141 mmol/L (ref 134–144)
Total Protein: 7.6 g/dL (ref 6.0–8.5)
eGFR: 85 mL/min/{1.73_m2} (ref >59)

## 2021-08-18 LAB — MICROALBUMIN / CREATININE URINE RATIO
Creatinine, Urine: 314 mg/dL
Microalb/Creat Ratio: 9 mg/g creat (ref 0–29)
Microalbumin, Urine: 29.4 ug/mL

## 2021-08-18 LAB — CBC WITH DIFFERENTIAL/PLATELET
Basophils Absolute: 0 10*3/uL (ref 0.0–0.2)
Basos: 1 %
EOS (ABSOLUTE): 0.1 10*3/uL (ref 0.0–0.4)
Eos: 2 %
Hematocrit: 41 % (ref 34.0–46.6)
Hemoglobin: 13.2 g/dL (ref 11.1–15.9)
Immature Grans (Abs): 0 10*3/uL (ref 0.0–0.1)
Immature Granulocytes: 0 %
Lymphocytes Absolute: 2.8 10*3/uL (ref 0.7–3.1)
Lymphs: 45 %
MCH: 26.7 pg (ref 26.6–33.0)
MCHC: 32.2 g/dL (ref 31.5–35.7)
MCV: 83 fL (ref 79–97)
Monocytes Absolute: 0.4 10*3/uL (ref 0.1–0.9)
Monocytes: 6 %
Neutrophils Absolute: 2.9 10*3/uL (ref 1.4–7.0)
Neutrophils: 46 %
Platelets: 300 10*3/uL (ref 150–450)
RBC: 4.94 x10E6/uL (ref 3.77–5.28)
RDW: 12.8 % (ref 11.7–15.4)
WBC: 6.2 10*3/uL (ref 3.4–10.8)

## 2021-08-18 LAB — LIPID PANEL
Chol/HDL Ratio: 2.7 ratio (ref 0.0–4.4)
Cholesterol, Total: 139 mg/dL (ref 100–199)
HDL: 52 mg/dL (ref >39)
LDL Chol Calc (NIH): 71 mg/dL (ref 0–99)
Triglycerides: 80 mg/dL (ref 0–149)
VLDL Cholesterol Cal: 16 mg/dL (ref 5–40)

## 2021-08-18 LAB — SEDIMENTATION RATE: Sed Rate: 23 mm/hr (ref 0–40)

## 2021-08-18 LAB — HEMOGLOBIN A1C
Est. average glucose Bld gHb Est-mCnc: 131 mg/dL
Hgb A1c MFr Bld: 6.2 % — ABNORMAL HIGH (ref 4.8–5.6)

## 2021-09-04 ENCOUNTER — Telehealth: Payer: Self-pay | Admitting: Family Medicine

## 2021-09-04 NOTE — Telephone Encounter (Signed)
[name] from cover my neds callling about PA that was denied for Saxenda. She says she is sending in a resubmission.

## 2021-09-16 ENCOUNTER — Telehealth: Payer: Self-pay | Admitting: Family Medicine

## 2021-09-16 NOTE — Telephone Encounter (Signed)
Pt called in states pa for saxenda was denied and needs to be reworded, please call back

## 2021-09-17 ENCOUNTER — Telehealth: Payer: Self-pay

## 2021-09-17 NOTE — Telephone Encounter (Signed)
Caller name: Loraine Leriche  Relation to pt: [facility] PA  Call back number: [phone]  Reference [id number]  Reason for call: Faxing over lack of information form to [phone], please note if received

## 2021-09-17 NOTE — Telephone Encounter (Signed)
Completed, awaiting signature from Dr. Carlynn Purl and will fax.

## 2021-09-17 NOTE — Telephone Encounter (Signed)
[name] was finally approved through [employer]. I contacted [facility] to make sure it cleared on thei end as well and they confirmed and are filling it for patient currently. Patient was contacted and made aware and she expressed gratitude and appreciation for our help. PA approval will be scanned into chart.

## 2021-11-17 ENCOUNTER — Other Ambulatory Visit: Payer: Self-pay | Admitting: Family Medicine

## 2021-11-17 DIAGNOSIS — F411 Generalized anxiety disorder: Secondary | ICD-10-CM

## 2021-11-18 NOTE — Progress Notes (Deleted)
Name: [name]   MRN: [id number]    DOB: [birth year]   Date:11/18/2021       Progress Note  Subjective  Chief Complaint  Follow Up  HPI  S/p Right hip Replacement : she had right hip pain for months , finally got right hip replacement in March 2022 by Dr. Harlow Mares however struggling with a limp ( right leg much longer than left ), she is going to pain clinic, she takes Oxycodone usually once a day. Pain is described as aching and current pain is 1/10. Pain goes up to 5/10 She saw podiatrist and had a heel cup and is walking better, limp has improved.    Obesity: BMI above 30 but right under 35. She was going to the weight loss clinic, she states phentermine worked well for her, but since she has been off medication bp is at goal. She states she stopped going to the weight loss clinic because of cost. She became obese in her early 20's and she has tried multiple diets. Such as Weight Watchers, cabbage diet, Atkins , she loses weight on her diet but gains it back. . She tried Ozempic but stopped on her own , she came in in June  2022 and stated a friend was taking Saxenda  so we gave it to her , she is taking medication but she gained 4 lbs since last visit. She states works full time at [employer] and part time at [employer] and eats at [employer]. Discussed packing lunch and snacks.She states she splurged during the holidays, but she is ready to resume her diet. She is still walking her dog twice daily.   HTN: bp is at goal at [place]  40/5/25 , bp is at goal, continue medications. She denies chest pain, palpitation or dizziness. She states bp at home has been well controlled    Anxiety: she continues to feel anxious. Symptoms started when her son went to prison around 2014 for selling drugs, he was  out of jail, but he was in a fight and got arrested and they bailed him out,  he fail his probation for positive drug test, he is back in prison, he is coming out of jail next week and has to stay with him for  30 days. She has been taking Buspar only once a day but still feels anxious, she states Buspar helps but her mind still races.  GERD: she  is back on PPI and symptoms controlled with Omeprazole. No heartburn or indigestion    Pre-diabetes: she denies polyphagia, polyuria or polydipsia. Losing weight, happy with it, last A1C was 6.3 %, she will have labs done today    OSA: also has LVH,  hypertension, interrupted sleep - She went to pulmonologist - Dr. Halford Chessman , has moderate OSA, she still does not use her CPAP, advised again to contact pulmonologist   Hyperlipidemia: taking Rosuvastatin and denies side effects, she needs to have labs done   Elevated sed rate: labs done 03/2020, at 85, it was higher after hip replacement, she did not have labs done in October but willing to go now   Patient Active Problem List   Diagnosis Date Noted   History of total hip replacement, right 10/10/2020   OSA (obstructive sleep apnea) 09/08/2019   Ganglion cyst of dorsum of left wrist 02/05/2018   LVH (left ventricular hypertrophy) due to hypertensive disease, without heart failure 09/23/2017   SOBOE (shortness of breath on exertion) 09/23/2017   Hyperlipidemia, mixed  08/28/2017   Hyperglycemia 08/25/2017   Hypercalcemia due to thiazide and vitamin A 08/25/2017   Hypertension 08/18/2017   GAD (generalized anxiety disorder) 08/18/2017   GERD (gastroesophageal reflux disease) 08/18/2017   Panic attack 08/18/2017   Tobacco use 123456   Systolic ejection murmur 123456    Past Surgical History:  Procedure Laterality Date   ABDOMINAL HYSTERECTOMY     Complete Hysterectomy   CHOLECYSTECTOMY     GANGLION CYST EXCISION Left 03/01/2018   Procedure: REMOVAL GANGLION OF WRIST;  Surgeon: Earnestine Leys, MD;  Location: [facility] ORS;  Service: Orthopedics;  Laterality: Left;   TOTAL HIP ARTHROPLASTY Right 10/10/2020   Procedure: TOTAL HIP ARTHROPLASTY ANTERIOR APPROACH;  Surgeon: Lovell Sheehan, MD;  Location:  [facility] ORS;  Service: Orthopedics;  Laterality: Right;    Family History  Problem Relation Age of Onset   Hypertension Mother    Stroke Father    Hypertension Father    Hypertension Sister    Breast cancer Neg Hx     Social History   Tobacco Use   Smoking status: Every Day    Packs/day: 0.50    Years: 18.00    Pack years: 9.00    Types: Cigarettes    Start date: 08/19/1999   Smokeless tobacco: Never  Substance Use Topics   Alcohol use: Yes    Alcohol/week: 1.0 standard drink    Types: 1 Glasses of wine per week    Comment: rare on Saturdays      Current Outpatient Medications:    busPIRone (BUSPAR) 5 MG tablet, TAKE 1 TABLET(5 MG) BY MOUTH TWICE DAILY, Disp: 60 tablet, Rfl: 0   FLUoxetine (PROZAC) 20 MG capsule, Take 1 capsule (20 mg total) by mouth daily., Disp: 90 capsule, Rfl: 1   fluticasone (FLONASE) 50 MCG/ACT nasal spray, SHAKE LIQUID AND USE 2 SPRAYS IN EACH NOSTRIL DAILY AS NEEDED FOR ALLERGIES, Disp: 48 g, Rfl: 0   gabapentin (NEURONTIN) 300 MG capsule, Take 300 mg by mouth 2 (two) times daily., Disp: , Rfl:    Liraglutide -Weight Management (SAXENDA) 18 MG/3ML SOPN, ADMINISTER 3 MG UNDER THE SKIN DAILY WITH BREAKFAST, Disp: 15 mL, Rfl: 0   Olmesartan-amLODIPine-HCTZ 40-5-25 MG TABS, Take 1 tablet by mouth every morning., Disp: 90 tablet, Rfl: 1   omeprazole (PRILOSEC) 40 MG capsule, Take 1 capsule (40 mg total) by mouth daily as needed (heartburn)., Disp: 90 capsule, Rfl: 1   oxyCODONE-acetaminophen (PERCOCET) 10-325 MG tablet, Take 1 tablet by mouth daily as needed., Disp: , Rfl:    rosuvastatin (CRESTOR) 10 MG tablet, Take 1 tablet (10 mg total) by mouth every morning., Disp: 90 tablet, Rfl: 1  Allergies  Allergen Reactions   Penicillins Itching and Swelling    Has patient had a PCN reaction causing immediate rash, facial/tongue/throat swelling, SOB or lightheadedness with hypotension: No Has patient had a PCN reaction causing severe rash involving mucus  membranes or skin necrosis: No Has patient had a PCN reaction that required hospitalization: No Has patient had a PCN reaction occurring within the last 10 years: No If all of the above answers are "NO", then may proceed with Cephalosporin use.     I personally reviewed active problem list, medication list, allergies, family history, social history, health maintenance with the patient/caregiver today.   ROS  ***  Objective  There were no vitals filed for this visit.  There is no height or weight on file to calculate BMI.  Physical Exam ***  No results found  for this or any previous visit (from the past 2160 hour(s)).   PHQ2/9:    08/14/2021    8:58 AM 05/14/2021    8:19 AM 03/21/2021    1:43 PM 02/08/2021    8:33 AM 10/08/2020    8:15 AM  Depression screen PHQ 2/9  Decreased Interest 0 0 1 2 0  Down, Depressed, Hopeless 0 0 1 2 0  PHQ - 2 Score 0 0 2 4 0  Altered sleeping 0 0 2 1   Tired, decreased energy 0 0 0 0   Change in appetite 0 0 0 0   Feeling bad or failure about yourself  0 0 0 0   Trouble concentrating 0 0 0 0   Moving slowly or fidgety/restless 0 0 0 0   Suicidal thoughts 0 0 0 0   PHQ-9 Score 0 0 4 5   Difficult doing work/chores   Not difficult at all      phq 9 is {gen pos [id number]   Fall Risk:    08/14/2021    8:58 AM 05/14/2021    8:19 AM 03/21/2021    1:43 PM 02/08/2021    8:33 AM 10/08/2020    8:15 AM  Fall Risk   Falls in the past year? 0 0 0 0 0  Number falls in past yr: 0 0 0 0 0  Injury with Fall? 0 0 0 0 0  Risk for fall due to : No Fall Risks No Fall Risks     Follow up Falls prevention discussed Falls prevention discussed         Functional Status Survey:      Assessment & Plan  *** There are no diagnoses linked to this encounter.

## 2021-11-19 ENCOUNTER — Ambulatory Visit: Payer: Managed Care, Other (non HMO) | Admitting: Family Medicine

## 2021-12-17 ENCOUNTER — Other Ambulatory Visit: Payer: Self-pay | Admitting: Family Medicine

## 2021-12-17 DIAGNOSIS — F411 Generalized anxiety disorder: Secondary | ICD-10-CM

## 2021-12-17 NOTE — Telephone Encounter (Signed)
She is scheduled for 12/27/2021 with Dr Rosana Berger due to Dr Ancil Boozer being out of the office

## 2021-12-18 NOTE — Telephone Encounter (Signed)
You do not have anything for when the pt needs

## 2021-12-26 NOTE — Progress Notes (Unsigned)
Established Patient Office Visit  Subjective   Patient ID: [name], female    DOB: 03-Nov-1965  Age: 56 y.o. MRN: [id number]  No chief complaint on file.   HPI [name] is a 56 year old female here for follow up on chronic medical conditions.  Hypertension/OSA: -Medications: Olmesartan-Amlodipine-HCTZ 40/5/25 -Patient is compliant with above medications and reports no side effects. -Checking BP at home (average): *** -Highest BP at home: *** -Lowest BP at home: *** -Denies any SOB, CP, vision changes, LE edema or symptoms of hypotension -Diet: *** -Exercise: *** -Not compliant with CPAP   HLD: -Medications: Crestor 10 -Patient is compliant with above medications and reports no side effects.  -Last lipid panel: Lipid Panel     Component Value Date/Time   CHOL 139 08/16/2021 1606   TRIG 80 08/16/2021 1606   HDL 52 08/16/2021 1606   CHOLHDL 2.7 08/16/2021 1606   CHOLHDL 3.5 12/22/2018 0938   LDLCALC 71 08/16/2021 1606   LDLCALC 116 (H) 12/22/2018 0938   LABVLDL 16 08/16/2021 1606    Pre-Diabetes: -Last A1c 6.2% 1/23  GERD: -Currently on Prilosec 40 mg daily   Obesity:  -BMI today  -Had been seen by weight loss clinic but stopped going due to cost  -Currently on Saxenda 3 mg daily   Anxiety:  -Duration:{Blank single:19197::"controlled","uncontrolled","better","worse","exacerbated","stable"} -Anxious mood: {Blank single:19197::"yes","no"}  -Excessive worrying: {Blank single:19197::"yes","no"} -Irritability: {Blank single:19197::"yes","no"}  -Sweating: {Blank single:19197::"yes","no"} -Nausea: {Blank single:19197::"yes","no"} -Palpitations:{Blank single:19197::"yes","no"} -Hyperventilation: {Blank single:19197::"yes","no"} -Panic attacks: {Blank single:19197::"yes","no"} -Agoraphobia: {Blank single:19197::"yes","no"}  -Obsessions/compulsions: {Blank single:19197::"yes","no"} -Depressed mood: {Blank single:19197::"yes","no"}    08/14/2021     8:58 AM 05/14/2021    8:19 AM 03/21/2021    1:43 PM 02/08/2021    8:33 AM 10/08/2020    8:15 AM  Depression screen PHQ 2/9  Decreased Interest 0 0 1 2 0  Down, Depressed, Hopeless 0 0 1 2 0  PHQ - 2 Score 0 0 2 4 0  Altered sleeping 0 0 2 1   Tired, decreased energy 0 0 0 0   Change in appetite 0 0 0 0   Feeling bad or failure about yourself  0 0 0 0   Trouble concentrating 0 0 0 0   Moving slowly or fidgety/restless 0 0 0 0   Suicidal thoughts 0 0 0 0   PHQ-9 Score 0 0 4 5   Difficult doing work/chores   Not difficult at all     -Anhedonia: {Blank single:19197::"yes","no"} -Weight changes: {Blank single:19197::"yes","no"} -Insomnia: {Blank single:19197::"yes","no"} {Blank single:19197::"hard to fall asleep","hard to stay asleep"}  -Hypersomnia: {Blank single:19197::"yes","no"} -Fatigue/loss of energy: {Blank single:19197::"yes","no"} -Feelings of worthlessness: {Blank single:19197::"yes","no"} -Feelings of guilt: {Blank single:19197::"yes","no"} -Impaired concentration/indecisiveness: {Blank single:19197::"yes","no"} -Suicidal ideations: {Blank single:19197::"yes","no"}  -Crying spells: {Blank single:19197::"yes","no"} -Recent Stressors/Life Changes: {Blank single:19197::"yes","no"}   Relationship problems: {Blank single:19197::"yes","no"}   Family stress: {Blank single:19197::"yes","no"}     Financial stress: {Blank single:19197::"yes","no"}    Job stress: {Blank single:19197::"yes","no"}    Recent death/loss: {Blank single:19197::"yes","no"} -Current Treatments: Buspar 5 mg -Patient is compliant with the above medications at above dose and reports no side effects. *** -Past Treatments: -Counseling: ***    {History (Optional):23778}  ROS    Objective:     There were no vitals taken for this visit. {Vitals History (Optional):23777}  Physical Exam   No results found for any visits on 12/27/21.  {Labs (Optional):23779}  The 10-year ASCVD risk score (Arnett DK,  et al., 2019) is: 4.9%    Assessment & Plan:  Problem List Items Addressed This Visit   None   No follow-ups on file.    Margarita Mail, DO

## 2021-12-27 ENCOUNTER — Ambulatory Visit
Admission: RE | Admit: 2021-12-27 | Discharge: 2021-12-27 | Disposition: A | Discharge status: Home / Self Care | Payer: Managed Care, Other (non HMO) | Attending: Internal Medicine | Admitting: Internal Medicine

## 2021-12-27 ENCOUNTER — Ambulatory Visit (INDEPENDENT_AMBULATORY_CARE_PROVIDER_SITE_OTHER): Payer: Managed Care, Other (non HMO) | Admitting: Internal Medicine

## 2021-12-27 ENCOUNTER — Encounter: Payer: Self-pay | Admitting: Internal Medicine

## 2021-12-27 ENCOUNTER — Ambulatory Visit
Admission: RE | Admit: 2021-12-27 | Discharge: 2021-12-27 | Disposition: A | Discharge status: Home / Self Care | Payer: Managed Care, Other (non HMO) | Source: Ambulatory Visit | Attending: Internal Medicine | Admitting: Internal Medicine

## 2021-12-27 VITALS — BP 134/82 | HR 92 | Temp 98.0°F | Resp 16 | Ht 61.0 in | Wt 170.3 lb

## 2021-12-27 DIAGNOSIS — M79661 Pain in right lower leg: Secondary | ICD-10-CM | POA: Insufficient documentation

## 2021-12-27 DIAGNOSIS — R7303 Prediabetes: Secondary | ICD-10-CM

## 2021-12-27 DIAGNOSIS — M25561 Pain in right knee: Secondary | ICD-10-CM | POA: Diagnosis present

## 2021-12-27 DIAGNOSIS — E7800 Pure hypercholesterolemia, unspecified: Secondary | ICD-10-CM | POA: Diagnosis not present

## 2021-12-27 DIAGNOSIS — I10 Essential (primary) hypertension: Secondary | ICD-10-CM | POA: Diagnosis not present

## 2021-12-27 DIAGNOSIS — F411 Generalized anxiety disorder: Secondary | ICD-10-CM

## 2021-12-27 DIAGNOSIS — K219 Gastro-esophageal reflux disease without esophagitis: Secondary | ICD-10-CM

## 2021-12-27 MED ORDER — OLMESARTAN-AMLODIPINE-HCTZ 40-5-25 MG PO TABS: 1.0000 | ORAL_TABLET | ORAL | 1 refills | Status: DC

## 2021-12-27 MED ORDER — ESCITALOPRAM OXALATE 5 MG PO TABS: 5.0000 mg | ORAL_TABLET | Freq: Every day | ORAL | 1 refills | Status: DC

## 2021-12-27 MED ORDER — ROSUVASTATIN CALCIUM 10 MG PO TABS: 10.0000 mg | ORAL_TABLET | ORAL | 1 refills | Status: DC

## 2021-12-27 MED ORDER — OMEPRAZOLE 40 MG PO CPDR: 40.0000 mg | DELAYED_RELEASE_CAPSULE | Freq: Every day | ORAL | 1 refills | Status: DC | PRN

## 2021-12-27 NOTE — Patient Instructions (Signed)
It was great seeing you today!  Plan discussed at today's visit: -Refills of medications sent, start Lexapro 5 mg daily, can take Buspar as well for anxiety as needed -Knee and shin x-rays today  Follow up in: 6 weeks   Take care and let us know if you have any questions or concerns prior to your next visit.  Dr. Caralee Ates

## 2022-01-19 ENCOUNTER — Encounter: Payer: Self-pay | Admitting: Family Medicine

## 2022-01-20 ENCOUNTER — Other Ambulatory Visit: Payer: Self-pay | Admitting: Family Medicine

## 2022-01-20 DIAGNOSIS — Z720 Tobacco use: Secondary | ICD-10-CM

## 2022-01-20 MED ORDER — VARENICLINE TARTRATE 0.5 MG PO TABS: 0.5000 mg | ORAL_TABLET | Freq: Two times a day (BID) | ORAL | 0 refills | Status: DC

## 2022-02-03 ENCOUNTER — Other Ambulatory Visit: Payer: Self-pay | Admitting: Family Medicine

## 2022-02-03 DIAGNOSIS — Z1231 Encounter for screening mammogram for malignant neoplasm of breast: Secondary | ICD-10-CM

## 2022-02-07 ENCOUNTER — Ambulatory Visit (INDEPENDENT_AMBULATORY_CARE_PROVIDER_SITE_OTHER): Payer: Managed Care, Other (non HMO) | Admitting: Internal Medicine

## 2022-02-07 ENCOUNTER — Encounter: Payer: Self-pay | Admitting: Internal Medicine

## 2022-02-07 VITALS — BP 130/82 | HR 96 | Temp 98.2°F | Resp 16 | Ht 61.0 in | Wt 171.0 lb

## 2022-02-07 DIAGNOSIS — F419 Anxiety disorder, unspecified: Secondary | ICD-10-CM

## 2022-02-07 DIAGNOSIS — I10 Essential (primary) hypertension: Secondary | ICD-10-CM | POA: Diagnosis not present

## 2022-02-07 DIAGNOSIS — K219 Gastro-esophageal reflux disease without esophagitis: Secondary | ICD-10-CM

## 2022-02-07 MED ORDER — ESCITALOPRAM OXALATE 10 MG PO TABS: 10.0000 mg | ORAL_TABLET | Freq: Every day | ORAL | 1 refills | Status: DC

## 2022-02-07 MED ORDER — PANTOPRAZOLE SODIUM 40 MG PO TBEC: 40.0000 mg | DELAYED_RELEASE_TABLET | Freq: Every day | ORAL | 1 refills | Status: DC

## 2022-02-07 NOTE — Progress Notes (Signed)
Established Patient Office Visit  Subjective   Patient ID: [name], female    DOB: 18-May-1966  Age: 56 y.o. MRN: [id number]  Chief Complaint  Patient presents with   Follow-up   Hypertension   Smoking Cessation Screening    HPI [name] is a 56 year old female here for follow up on chronic medical conditions.  Hypertension/OSA: -Medications: Olmesartan-Amlodipine-HCTZ 40/5/25 -Patient is compliant with above medications and reports no side effects. -Checking BP at home (average): doesn't check  -Denies any SOB, CP, vision changes, LE edema or symptoms of hypotension. Gets a headache when high.  -Not compliant with CPAP   HLD: -Medications: Crestor 10 -Patient is compliant with above medications and reports no side effects.  -Last lipid panel: Lipid Panel     Component Value Date/Time   CHOL 139 08/16/2021 1606   TRIG 80 08/16/2021 1606   HDL 52 08/16/2021 1606   CHOLHDL 2.7 08/16/2021 1606   CHOLHDL 3.5 12/22/2018 0938   LDLCALC 71 08/16/2021 1606   LDLCALC 116 (H) 12/22/2018 0938   LABVLDL 16 08/16/2021 1606    Pre-Diabetes: -Last A1c 6.2% 1/23  GERD: -Currently on Prilosec 40 mg daily  -Does occasionally get acid reflux/chest pain like symptoms when she lays down in her bed at night. This seems to be worse lately. Denies abdominal pain, nausea, vomiting.  Appetite good.  Weight stable.  Obesity:  -BMI today 32 -Had been seen by weight loss clinic but stopped going due to cost  -Currently on Saxenda 3 mg daily   Anxiety: -Duration:better -Recent Stressors/Life Changes: yes     Family stress: yes   -Current Treatments: Lexapro 5 mg added at last office visit, doing very well with it. Has not been using Buspar will discontinue this from list.  -Patient is compliant with the above medications at above dose and reports no side effects.  -Counseling: Not interested     02/07/2022    3:03 PM 12/27/2021    2:37 PM 08/14/2021    8:58 AM 05/14/2021     8:19 AM 03/21/2021    1:43 PM  Depression screen PHQ 2/9  Decreased Interest 0 0 0 0 1  Down, Depressed, Hopeless 0 0 0 0 1  PHQ - 2 Score 0 0 0 0 2  Altered sleeping 0 0 0 0 2  Tired, decreased energy 0 0 0 0 0  Change in appetite 0 0 0 0 0  Feeling bad or failure about yourself  0 0 0 0 0  Trouble concentrating 0 0 0 0 0  Moving slowly or fidgety/restless 0 0 0 0 0  Suicidal thoughts 0 0 0 0 0  PHQ-9 Score 0 0 0 0 4  Difficult doing work/chores Not difficult at all Not difficult at all   Not difficult at all   Tobacco Cessation: -Started Chantix and has not had a cigarette in over 2 weeks, feeling much better  Review of Systems  Constitutional:  Negative for chills and fever.  Eyes:  Negative for blurred vision.  Respiratory:  Negative for shortness of breath.   Cardiovascular:  Negative for chest pain.  Gastrointestinal:  Positive for heartburn. Negative for abdominal pain, nausea and vomiting.  Neurological:  Negative for headaches.  Psychiatric/Behavioral:  The patient is nervous/anxious.       Objective:     BP 130/82   Pulse 96   Temp 98.2 F (36.8 C)   Resp 16   Ht 5' 1" (1.549  m)   Wt 171 lb (77.6 kg)   SpO2 96%   BMI 32.31 kg/m  BP Readings from Last 3 Encounters:  12/27/21 134/82  08/14/21 132/66  02/12/21 (!) 141/77   Wt Readings from Last 3 Encounters:  02/07/22 171 lb (77.6 kg)  12/27/21 170 lb 4.8 oz (77.2 kg)  08/14/21 169 lb (76.7 kg)      Physical Exam Constitutional:      Appearance: Normal appearance.  HENT:     Head: Normocephalic and atraumatic.  Eyes:     Conjunctiva/sclera: Conjunctivae normal.  Cardiovascular:     Rate and Rhythm: Normal rate and regular rhythm.  Pulmonary:     Effort: Pulmonary effort is normal.     Breath sounds: Normal breath sounds.  Musculoskeletal:        General: Tenderness present. No swelling or deformity.     Right lower leg: No edema.     Left lower leg: No edema.     Comments: Tenderness in the  anterior shin, slightly inferior from the tibial plateau.  No tenderness in the knee itself, good range of motion with exception of pain with flexion of the knee.  Skin:    General: Skin is warm and dry.  Neurological:     General: No focal deficit present.     Mental Status: She is alert. Mental status is at baseline.  Psychiatric:        Mood and Affect: Mood normal.        Behavior: Behavior normal.      No results found for any visits on 02/07/22.  Last CBC Lab Results  Component Value Date   WBC 6.2 08/16/2021   HGB 13.2 08/16/2021   HCT 41.0 08/16/2021   MCV 83 08/16/2021   MCH 26.7 08/16/2021   RDW 12.8 08/16/2021   PLT 300 08/16/2021   Last metabolic panel Lab Results  Component Value Date   GLUCOSE 107 (H) 08/16/2021   NA 141 08/16/2021   K 4.9 08/16/2021   CL 103 08/16/2021   CO2 26 08/16/2021   BUN 17 08/16/2021   CREATININE 0.82 08/16/2021   EGFR 85 08/16/2021   CALCIUM 10.3 (H) 08/16/2021   PROT 7.6 08/16/2021   ALBUMIN 5.1 (H) 08/16/2021   LABGLOB 2.5 08/16/2021   AGRATIO 2.0 08/16/2021   BILITOT 0.2 08/16/2021   ALKPHOS 66 08/16/2021   AST 18 08/16/2021   ALT 19 08/16/2021   ANIONGAP 9 10/15/2020   Last lipids Lab Results  Component Value Date   CHOL 139 08/16/2021   HDL 52 08/16/2021   LDLCALC 71 08/16/2021   TRIG 80 08/16/2021   CHOLHDL 2.7 08/16/2021   Last hemoglobin A1c Lab Results  Component Value Date   HGBA1C 6.2 (H) 08/16/2021   Last thyroid functions Lab Results  Component Value Date   TSH 2.37 12/22/2018   T4TOTAL 8.8 12/22/2018   Last vitamin D No results found for: "25OHVITD2", "25OHVITD3", "VD25OH" Last vitamin B12 and Folate No results found for: "VITAMINB12", "FOLATE"    The 10-year ASCVD risk score (Arnett DK, et al., 2019) is: 5.1%    Assessment & Plan:   1. Anxiety: Doing much better, would like to go up to 10 mg dose of the Lexapro.   - escitalopram (LEXAPRO) 10 MG tablet; Take 1 tablet (10 mg total)  by mouth daily.  Dispense: 90 tablet; Refill: 1  2. Gastroesophageal reflux disease without esophagitis: Having break through symptoms, discussed avoiding trigger foods and avoiding   laying down 3-4 hours after eating. Switch from Omeprazole to Pantoprazole 40 mg daily to see if this changes symptoms at all.   - pantoprazole (PROTONIX) 40 MG tablet; Take 1 tablet (40 mg total) by mouth daily.  Dispense: 90 tablet; Refill: 1  3. Benign essential hypertension: Chronic and stable. BP good today. Continue Olmesartan-Amlodipine-HCTZ 40-5-25  mg daily.   Return in about 5 months (around 07/10/2022).    Elisabeth Andrews, DO  

## 2022-02-07 NOTE — Patient Instructions (Addendum)
It was great seeing you today!  Plan discussed at today's visit: -Increase Lexapro to 10 mg daily -Congratulations on quitting smoking!! -Switch fro Prilosec to Protonix for acid reflux -Avoid trigger foods like spicy or acidic foods and avoid laying down for 3-4 hours after eating   Follow up in: 5 months   Take care and let us know if you have any questions or concerns prior to your next visit.  Dr. Caralee Ates   Gastroesophageal Reflux Disease, Adult Gastroesophageal reflux (GER) happens when acid from the stomach flows up into the tube that connects the mouth and the stomach (esophagus). Normally, food travels down the esophagus and stays in the stomach to be digested. However, when a person has GER, food and stomach acid sometimes move back up into the esophagus. If this becomes a more serious problem, the person may be diagnosed with a disease called gastroesophageal reflux disease (GERD). GERD occurs when the reflux: Happens often. Causes frequent or severe symptoms. Causes problems such as damage to the esophagus. When stomach acid comes in contact with the esophagus, the acid may cause inflammation in the esophagus. Over time, GERD may create small holes (ulcers) in the lining of the esophagus. What are the causes? This condition is caused by a problem with the muscle between the esophagus and the stomach (lower esophageal sphincter, or LES). Normally, the LES muscle closes after food passes through the esophagus to the stomach. When the LES is weakened or abnormal, it does not close properly, and that allows food and stomach acid to go back up into the esophagus. The LES can be weakened by certain dietary substances, medicines, and medical conditions, including: Tobacco use. Pregnancy. Having a hiatal hernia. Alcohol use. Certain foods and beverages, such as coffee, chocolate, onions, and peppermint. What increases the risk? You are more likely to develop this condition if  you: Have an increased body weight. Have a connective tissue disorder. Take NSAIDs, such as ibuprofen. What are the signs or symptoms? Symptoms of this condition include: Heartburn. Difficult or painful swallowing and the feeling of having a lump in the throat. A bitter taste in the mouth. Bad breath and having a large amount of saliva. Having an upset or bloated stomach and belching. Chest pain. Different conditions can cause chest pain. Make sure you see your health care provider if you experience chest pain. Shortness of breath or wheezing. Ongoing (chronic) cough or a nighttime cough. Wearing away of tooth enamel. Weight loss. How is this diagnosed? This condition may be diagnosed based on a medical history and a physical exam. To determine if you have mild or severe GERD, your health care provider may also monitor how you respond to treatment. You may also have tests, including: A test to examine your stomach and esophagus with a small camera (endoscopy). A test that measures the acidity level in your esophagus. A test that measures how much pressure is on your esophagus. A barium swallow or modified barium swallow test to show the shape, size, and functioning of your esophagus. How is this treated? Treatment for this condition may vary depending on how severe your symptoms are. Your health care provider may recommend: Changes to your diet. Medicine. Surgery. The goal of treatment is to help relieve your symptoms and to prevent complications. Follow these instructions at home: Eating and drinking  Follow a diet as recommended by your health care provider. This may involve avoiding foods and drinks such as: Coffee and tea, with or without caffeine.  Drinks that contain alcohol. Energy drinks and sports drinks. Carbonated drinks or sodas. Chocolate and cocoa. Peppermint and mint flavorings. Garlic and onions. Horseradish. Spicy and acidic foods, including peppers, chili  powder, curry powder, vinegar, hot sauces, and barbecue sauce. Citrus fruit juices and citrus fruits, such as oranges, lemons, and limes. Tomato-based foods, such as red sauce, chili, salsa, and pizza with red sauce. Fried and fatty foods, such as donuts, french fries, potato chips, and high-fat dressings. High-fat meats, such as hot dogs and fatty cuts of red and white meats, such as rib eye steak, sausage, ham, and bacon. High-fat dairy items, such as whole milk, butter, and cream cheese. Eat small, frequent meals instead of large meals. Avoid drinking large amounts of liquid with your meals. Avoid eating meals during the 2-3 hours before bedtime. Avoid lying down right after you eat. Do not exercise right after you eat. Lifestyle  Do not use any products that contain nicotine or tobacco. These products include cigarettes, chewing tobacco, and vaping devices, such as e-cigarettes. If you need help quitting, ask your health care provider. Try to reduce your stress by using methods such as yoga or meditation. If you need help reducing stress, ask your health care provider. If you are overweight, reduce your weight to an amount that is healthy for you. Ask your health care provider for guidance about a safe weight loss goal. General instructions Pay attention to any changes in your symptoms. Take over-the-counter and prescription medicines only as told by your health care provider. Do not take aspirin, ibuprofen, or other NSAIDs unless your health care provider told you to take these medicines. Wear loose-fitting clothing. Do not wear anything tight around your waist that causes pressure on your abdomen. Raise (elevate) the head of your bed about 6 inches (15 cm). You can use a wedge to do this. Avoid bending over if this makes your symptoms worse. Keep all follow-up visits. This is important. Contact a health care provider if: You have: New symptoms. Unexplained weight loss. Difficulty  swallowing or it hurts to swallow. Wheezing or a persistent cough. A hoarse voice. Your symptoms do not improve with treatment. Get help right away if: You have sudden pain in your arms, neck, jaw, teeth, or back. You suddenly feel sweaty, dizzy, or light-headed. You have chest pain or shortness of breath. You vomit and the vomit is green, yellow, or black, or it looks like blood or coffee grounds. You faint. You have stool that is red, bloody, or black. You cannot swallow, drink, or eat. These symptoms may represent a serious problem that is an emergency. Do not wait to see if the symptoms will go away. Get medical help right away. Call your local emergency services (911 in the [place] Do not drive yourself to the hospital. Summary Gastroesophageal reflux happens when acid from the stomach flows up into the esophagus. GERD is a disease in which the reflux happens often, causes frequent or severe symptoms, or causes problems such as damage to the esophagus. Treatment for this condition may vary depending on how severe your symptoms are. Your health care provider may recommend diet and lifestyle changes, medicine, or surgery. Contact a health care provider if you have new or worsening symptoms. Take over-the-counter and prescription medicines only as told by your health care provider. Do not take aspirin, ibuprofen, or other NSAIDs unless your health care provider told you to do so. Keep all follow-up visits as told by your health care provider. This  is important. This information is not intended to replace advice given to you by your health care provider. Make sure you discuss any questions you have with your health care provider. Document Revised: 01/23/2020 Document Reviewed: 01/23/2020 Elsevier Patient Education  2023 [employer].

## 2022-02-09 ENCOUNTER — Other Ambulatory Visit: Payer: Self-pay | Admitting: Family Medicine

## 2022-02-26 ENCOUNTER — Other Ambulatory Visit: Payer: Self-pay | Admitting: Family Medicine

## 2022-02-26 DIAGNOSIS — F411 Generalized anxiety disorder: Secondary | ICD-10-CM

## 2022-03-13 ENCOUNTER — Other Ambulatory Visit: Payer: Self-pay | Admitting: Family Medicine

## 2022-03-13 ENCOUNTER — Ambulatory Visit
Admission: RE | Admit: 2022-03-13 | Discharge: 2022-03-13 | Disposition: A | Discharge status: Home / Self Care | Payer: Managed Care, Other (non HMO) | Source: Ambulatory Visit | Attending: Family Medicine | Admitting: Family Medicine

## 2022-03-13 DIAGNOSIS — Z1231 Encounter for screening mammogram for malignant neoplasm of breast: Secondary | ICD-10-CM

## 2022-03-13 DIAGNOSIS — I10 Essential (primary) hypertension: Secondary | ICD-10-CM

## 2022-04-25 ENCOUNTER — Other Ambulatory Visit: Payer: Self-pay

## 2022-04-25 ENCOUNTER — Emergency Department: Payer: Managed Care, Other (non HMO)

## 2022-04-25 ENCOUNTER — Emergency Department
Admission: EM | Admit: 2022-04-25 | Discharge: 2022-04-26 | Disposition: A | Discharge status: Home / Self Care | Payer: Managed Care, Other (non HMO) | Attending: Emergency Medicine | Admitting: Emergency Medicine

## 2022-04-25 DIAGNOSIS — R079 Chest pain, unspecified: Secondary | ICD-10-CM

## 2022-04-25 DIAGNOSIS — F419 Anxiety disorder, unspecified: Secondary | ICD-10-CM | POA: Diagnosis not present

## 2022-04-25 DIAGNOSIS — F411 Generalized anxiety disorder: Secondary | ICD-10-CM

## 2022-04-25 DIAGNOSIS — Z79899 Other long term (current) drug therapy: Secondary | ICD-10-CM | POA: Insufficient documentation

## 2022-04-25 DIAGNOSIS — R7989 Other specified abnormal findings of blood chemistry: Secondary | ICD-10-CM | POA: Insufficient documentation

## 2022-04-25 DIAGNOSIS — I10 Essential (primary) hypertension: Secondary | ICD-10-CM | POA: Diagnosis not present

## 2022-04-25 DIAGNOSIS — Z96641 Presence of right artificial hip joint: Secondary | ICD-10-CM | POA: Diagnosis not present

## 2022-04-25 DIAGNOSIS — I159 Secondary hypertension, unspecified: Secondary | ICD-10-CM

## 2022-04-25 LAB — BASIC METABOLIC PANEL
Anion gap: 9 (ref 5–15)
BUN: 22 mg/dL — ABNORMAL HIGH (ref 6–20)
CO2: 26 mmol/L (ref 22–32)
Calcium: 10.2 mg/dL (ref 8.9–10.3)
Chloride: 108 mmol/L (ref 98–111)
Creatinine, Ser: 0.76 mg/dL (ref 0.44–1.00)
GFR, Estimated: >60 mL/min (ref >60)
Glucose, Bld: 111 mg/dL — ABNORMAL HIGH (ref 70–99)
Potassium: 4.2 mmol/L (ref 3.5–5.1)
Sodium: 143 mmol/L (ref 135–145)

## 2022-04-25 LAB — CBC
HCT: 44.6 % (ref 36.0–46.0)
Hemoglobin: 13.6 g/dL (ref 12.0–15.0)
MCH: 26.3 pg (ref 26.0–34.0)
MCHC: 30.5 g/dL (ref 30.0–36.0)
MCV: 86.1 fL (ref 80.0–100.0)
Platelets: 309 10*3/uL (ref 150–400)
RBC: 5.18 MIL/uL — ABNORMAL HIGH (ref 3.87–5.11)
RDW: 14 % (ref 11.5–15.5)
WBC: 8.7 10*3/uL (ref 4.0–10.5)
nRBC: 0 % (ref 0.0–0.2)

## 2022-04-25 LAB — TROPONIN I (HIGH SENSITIVITY): Troponin I (High Sensitivity): 21 ng/L — ABNORMAL HIGH (ref <18)

## 2022-04-25 MED ORDER — LORAZEPAM 2 MG/ML IJ SOLN
1.0000 mg | Freq: Once | INTRAMUSCULAR | Status: AC
  Administered 2022-04-26: 1 mg via INTRAVENOUS
  Filled 2022-04-25: qty 1

## 2022-04-25 MED ORDER — LABETALOL HCL 5 MG/ML IV SOLN
10.0000 mg | Freq: Once | INTRAVENOUS | Status: AC
  Administered 2022-04-26: 10 mg via INTRAVENOUS
  Filled 2022-04-25: qty 4

## 2022-04-25 NOTE — ED Provider Notes (Signed)
[facility] Provider Note    Event Date/Time   First MD Initiated Contact with Patient 04/25/22 2304     (approximate)   History   Chest Pain   HPI  [name] is a 56 y.o. female who presents to the ED from home with a chief complaint of chest pain.  Patient with a history of generalized anxiety disorder and hypertension who did not take her antihypertensive today because they make her sleepy and she had to work from home.  She was upset after an argument at home and experienced onset of left-sided chest burning associated with shortness of breath.  Denies diaphoresis, palpitations, nausea/vomiting or dizziness.  States she was hyperventilating and her husband kept telling her to calm down.  Denies recent fever, cough, abdominal pain, diarrhea.  Denies recent travel, trauma or hormone use.     Past Medical History   Past Medical History:  Diagnosis Date  . Acid reflux   . Arthritis   . GAD (generalized anxiety disorder)   . Hypertension   . OSA (obstructive sleep apnea) 09/08/2019   does not have a cpap  . Tobacco use      Active Problem List   Patient Active Problem List   Diagnosis Date Noted  . History of total hip replacement, right 10/10/2020  . OSA (obstructive sleep apnea) 09/08/2019  . Ganglion cyst of dorsum of left wrist 02/05/2018  . LVH (left ventricular hypertrophy) due to hypertensive disease, without heart failure 09/23/2017  . SOBOE (shortness of breath on exertion) 09/23/2017  . Hyperlipidemia, mixed 08/28/2017  . Hyperglycemia 08/25/2017  . Hypercalcemia due to thiazide and vitamin A 08/25/2017  . Hypertension 08/18/2017  . GAD (generalized anxiety disorder) 08/18/2017  . GERD (gastroesophageal reflux disease) 08/18/2017  . Panic attack 08/18/2017  . Tobacco use 08/18/2017  . Systolic ejection murmur 94/70/9628     Past Surgical History   Past Surgical History:  Procedure Laterality Date  . ABDOMINAL  HYSTERECTOMY     Complete Hysterectomy  . CHOLECYSTECTOMY    . GANGLION CYST EXCISION Left 03/01/2018   Procedure: REMOVAL GANGLION OF WRIST;  Surgeon: Earnestine Leys, MD;  Location: [facility] ORS;  Service: Orthopedics;  Laterality: Left;  . TOTAL HIP ARTHROPLASTY Right 10/10/2020   Procedure: TOTAL HIP ARTHROPLASTY ANTERIOR APPROACH;  Surgeon: Lovell Sheehan, MD;  Location: [facility] ORS;  Service: Orthopedics;  Laterality: Right;     Home Medications   Prior to Admission medications   Medication Sig Start Date End Date Taking? Authorizing Provider  escitalopram (LEXAPRO) 10 MG tablet Take 1 tablet (10 mg total) by mouth daily. 02/07/22   Teodora Medici, DO  fluticasone (FLONASE) 50 MCG/ACT nasal spray SHAKE LIQUID AND USE 2 SPRAYS IN EACH NOSTRIL DAILY AS NEEDED FOR ALLERGIES 04/02/21   Steele Sizer, MD  Liraglutide -Weight Management (SAXENDA) 18 MG/3ML SOPN ADMINISTER 3 MG UNDER THE SKIN DAILY WITH BREAKFAST 08/14/21   Steele Sizer, MD  Olmesartan-amLODIPine-HCTZ 40-5-25 MG TABS TAKE 1 TABLET BY MOUTH EVERY MORNING 03/13/22   Steele Sizer, MD  oxyCODONE-acetaminophen (PERCOCET) 10-325 MG tablet Take 1 tablet by mouth daily as needed. 05/02/21   Cullop, Nena Alexander, NP  pantoprazole (PROTONIX) 40 MG tablet Take 1 tablet (40 mg total) by mouth daily. 02/07/22   Teodora Medici, DO  rosuvastatin (CRESTOR) 10 MG tablet Take 1 tablet (10 mg total) by mouth every morning. 12/27/21   Teodora Medici, DO  varenicline (CHANTIX) 1 MG tablet Take 1 tablet (1  mg total) by mouth 2 (two) times daily. 02/09/22   Alba Cory, MD     Allergies  Penicillins   Family History   Family History  Problem Relation Age of Onset  . Hypertension Mother   . Stroke Father   . Hypertension Father   . Hypertension Sister   . Breast cancer Neg Hx      Physical Exam  Triage Vital Signs: ED Triage Vitals  Enc Vitals Group     BP 04/25/22 2227 (!) 207/114     Pulse Rate 04/25/22 2227 (!) 103      Resp 04/25/22 2227 20     Temp 04/25/22 2227 97.8 F (36.6 C)     Temp src --      SpO2 04/25/22 2227 98 %     Weight 04/25/22 2230 175 lb (79.4 kg)     Height --      Head Circumference --      Peak Flow --      Pain Score 04/25/22 2229 5     Pain Loc --      Pain Edu? --      Excl. in GC? --     Updated Vital Signs: BP 119/72   Pulse 80   Temp 97.8 F (36.6 C)   Resp 16   Wt 79.4 kg   SpO2 98%   BMI 33.07 kg/m    General: Awake, no distress.  CV:  RRR.  Good peripheral perfusion.  Resp:  Normal effort.  CTA B. Abd:  Nontender.  No distention.  Other:  Anxious.  No thyromegaly.  Bilateral calves are nontender and nonswollen.   ED Results / Procedures / Treatments  Labs (all labs ordered are listed, but only abnormal results are displayed) Labs Reviewed  BASIC METABOLIC PANEL - Abnormal; Notable for the following components:      Result Value   Glucose, Bld 111 (*)    BUN 22 (*)    All other components within normal limits  CBC - Abnormal; Notable for the following components:   RBC 5.18 (*)    All other components within normal limits  TROPONIN I (HIGH SENSITIVITY) - Abnormal; Notable for the following components:   Troponin I (High Sensitivity) 21 (*)    All other components within normal limits  TROPONIN I (HIGH SENSITIVITY) - Abnormal; Notable for the following components:   Troponin I (High Sensitivity) 22 (*)    All other components within normal limits     EKG  ED ECG REPORT I, Mabelle Mungin J, the attending physician, personally viewed and interpreted this ECG.   Date: 04/25/2022  EKG Time: 2223  Rate: 100  Rhythm: normal sinus rhythm  Axis: Normal  Intervals:none  ST&T Change: Nonspecific    RADIOLOGY I have independently visualized and interpreted patient's x-ray as well as noted the radiology interpretation:  Chest x-ray: No acute cardiopulmonary process  Official radiology report(s): DG Chest 2 View  Result Date:  04/25/2022 CLINICAL DATA:  Chest pain EXAM: CHEST - 2 VIEW COMPARISON:  Radiographs 01/28/2019 FINDINGS: No focal consolidation, pleural effusion, or pneumothorax. Normal cardiomediastinal silhouette. No acute osseous abnormality. IMPRESSION: No active cardiopulmonary disease. Electronically Signed   By: Minerva Fester M.D.   On: 04/25/2022 23:36     PROCEDURES:  Critical Care performed: No  .1-3 Lead EKG Interpretation  Performed by: Irean Hong, MD Authorized by: Irean Hong, MD     Interpretation: normal     ECG rate:  100   ECG rate assessment: normal     Rhythm: sinus rhythm     Ectopy: none     Conduction: normal   Comments:     Patient placed on cardiac monitor to evaluate for arrhythmias    MEDICATIONS ORDERED IN ED: Medications  LORazepam (ATIVAN) injection 1 mg (1 mg Intravenous Given 04/26/22 0007)  labetalol (NORMODYNE) injection 10 mg (10 mg Intravenous Given 04/26/22 0007)     IMPRESSION / MDM / ASSESSMENT AND PLAN / ED COURSE  I reviewed the triage vital signs and the nursing notes.                             56 year old female presenting with chest pain and elevated blood pressure in the setting of emotional upset. Differential diagnosis includes, but is not limited to, ACS, aortic dissection, pulmonary embolism, cardiac tamponade, pneumothorax, pneumonia, pericarditis, myocarditis, GI-related causes including esophagitis/gastritis, and musculoskeletal chest wall pain.   I have personally reviewed patient's records and note a PCP office visit for anxiety on 02/07/2022.  Patient's presentation is most consistent with acute presentation with potential threat to life or bodily function.  The patient is on the cardiac monitor to evaluate for evidence of arrhythmia and/or significant heart rate changes.  Initial labs demonstrate normal WBC 8.7, normal electrolytes, minimally elevated initial troponin of 21.  Chest x-ray is clear.  Patient remains hypertensive  212/109.  Will administer IV Ativan for anxiety and 10 mg IV labetalol.  Will reassess.  Will repeat time troponin.  Clinical Course as of 04/26/22 0234  Sat Apr 26, 2022  0109 Blood pressure improved to 136/68.  Patient resting comfortably in no acute distress.  Awaiting repeat troponin. [JS]  0233 Awaken patient from sleep to update her of repeat troponin which remained stable.  Blood pressure currently 119/72.  Patient voices no complaints of chest pain or shortness of breath.  Feel patient is safe for discharge home at this time.  Strict return precautions given.  Patient verbalizes understanding and agrees with plan of care. [JS]    Clinical Course User Index [JS] Irean Hong, MD     FINAL CLINICAL IMPRESSION(S) / ED DIAGNOSES   Final diagnoses:  Nonspecific chest pain  Hypertension, unspecified type  Anxiety state     Rx / DC Orders   ED Discharge Orders     None        Note:  This document was prepared using Dragon voice recognition software and may include unintentional dictation errors.   Irean Hong, MD 04/26/22 [phone]
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
## 2022-04-26 LAB — TROPONIN I (HIGH SENSITIVITY): Troponin I (High Sensitivity): 22 ng/L — ABNORMAL HIGH (ref <18)

## 2022-04-26 NOTE — Discharge Instructions (Signed)
Return to the ER for recurrent or worsening symptoms, persistent vomiting, difficulty breathing or other concerns. 

## 2022-07-10 NOTE — Progress Notes (Signed)
Name: [name]   MRN: [id number]    DOB: 03/24/1966   Date:07/11/2022       Progress Note  Subjective  Chief Complaint  Follow Up  HPI  S/p Right hip Replacement : she had right hip pain for months , finally got right hip replacement in March 2022 by Dr. Harlow Mares however struggling with a limp ( right leg much longer than left ), she is going to pain clinic, she takes Oxycodone usually once a day. Pain is described as aching and current pain is 1/10. Pain goes up to 5/10 She saw podiatrist and had a heel cup and is walking better, limp has improved.    Obesity: BMI above 30 but right under 35. She was going to the weight loss clinic, she states phentermine worked well for her, but since she has been off medication bp is at goal. She states she stopped going to the weight loss clinic because of cost. She became obese in her early 20's and she has tried multiple diets. Such as Weight Watchers, cabbage diet, Atkins , she loses weight on her diet but gains it back. . She tried Ozempic but stopped on her own , she came in in June  2022 and we gave her Kirke Shaggy , she is currently only taking it a few times a week and has gained weight since she stopped smoking in July weight is up from 171 lbs to 183.9 lbs. Discussed importance of taking medication daily and returning in 4-6 weeks for weight check   HTN: bp is at goal at [place]  40/5/25 , bp is at goal, continue medications. She denies chest pain, palpitation or dizziness. BP is borderline today    Anxiety:  Symptoms started when her son went to prison around 2014 for selling drugs, he was  out of jail, but he was in a fight and got arrested and they bailed him out,  he fail his probation for positive drug test, he is back in prison, he was out but this summer he was in a big argument with his girlfriend - she had a panic attack and went to [facility]. Marland Kitchen She is on Lexapro and is doing well now, her son has a full time job and is doing well, she still  worries but doing better   GERD: she  is back on PPI but only taking medication prn , at most a few times a week    Pre-diabetes: she denies polyphagia, polyuria or polydipsia. She has gained weight since last visit, we will recheck labs    OSA: also has LVH,  hypertension, interrupted sleep - She went to pulmonologist - Dr. Halford Chessman , has moderate OSA, she still does not use her CPAP, she has not been back to pulmonologist . . She sates her insurance does not cover the supplies   Hyperlipidemia: taking Rosuvastatin and denies side effects, we will recheck labs   Systolic Ejection Murmur: getting louder, we will refer her back to cardiologist    Patient Active Problem List   Diagnosis Date Noted   History of total hip replacement, right 10/10/2020   OSA (obstructive sleep apnea) 09/08/2019   Ganglion cyst of dorsum of left wrist 02/05/2018   LVH (left ventricular hypertrophy) due to hypertensive disease, without heart failure 09/23/2017   SOBOE (shortness of breath on exertion) 09/23/2017   Hyperlipidemia, mixed 08/28/2017   Hyperglycemia 08/25/2017   Hypercalcemia due to thiazide and vitamin A 08/25/2017   Hypertension 08/18/2017  GAD (generalized anxiety disorder) 08/18/2017   GERD (gastroesophageal reflux disease) 08/18/2017   Panic attack 08/18/2017   Systolic ejection murmur 08/18/2017    Past Surgical History:  Procedure Laterality Date   ABDOMINAL HYSTERECTOMY     Complete Hysterectomy   CHOLECYSTECTOMY     GANGLION CYST EXCISION Left 03/01/2018   Procedure: REMOVAL GANGLION OF WRIST;  Surgeon: Deeann Saint, MD;  Location: [facility] ORS;  Service: Orthopedics;  Laterality: Left;   TOTAL HIP ARTHROPLASTY Right 10/10/2020   Procedure: TOTAL HIP ARTHROPLASTY ANTERIOR APPROACH;  Surgeon: Lyndle Herrlich, MD;  Location: [facility] ORS;  Service: Orthopedics;  Laterality: Right;    Family History  Problem Relation Age of Onset   Hypertension Mother    Stroke Father    Hypertension  Father    Hypertension Sister    Breast cancer Neg Hx     Social History   Tobacco Use   Smoking status: Former    Packs/day: 0.50    Years: 18.00    Total pack years: 9.00    Types: Cigarettes    Start date: 08/19/1999    Quit date: 12/2021    Years since quitting: 0.5   Smokeless tobacco: Never  Substance Use Topics   Alcohol use: Yes    Alcohol/week: 1.0 standard drink of alcohol    Types: 1 Glasses of wine per week    Comment: rare on Saturdays      Current Outpatient Medications:    escitalopram (LEXAPRO) 10 MG tablet, Take 1 tablet (10 mg total) by mouth daily., Disp: 90 tablet, Rfl: 1   fluticasone (FLONASE) 50 MCG/ACT nasal spray, SHAKE LIQUID AND USE 2 SPRAYS IN EACH NOSTRIL DAILY AS NEEDED FOR ALLERGIES, Disp: 48 g, Rfl: 0   Liraglutide -Weight Management (SAXENDA) 18 MG/3ML SOPN, ADMINISTER 3 MG UNDER THE SKIN DAILY WITH BREAKFAST, Disp: 15 mL, Rfl: 0   Olmesartan-amLODIPine-HCTZ 40-5-25 MG TABS, TAKE 1 TABLET BY MOUTH EVERY MORNING, Disp: 90 tablet, Rfl: 1   oxyCODONE-acetaminophen (PERCOCET) 10-325 MG tablet, Take 1 tablet by mouth daily as needed., Disp: , Rfl:    pantoprazole (PROTONIX) 40 MG tablet, Take 1 tablet (40 mg total) by mouth daily., Disp: 90 tablet, Rfl: 1   rosuvastatin (CRESTOR) 10 MG tablet, Take 1 tablet (10 mg total) by mouth every morning., Disp: 90 tablet, Rfl: 1   varenicline (CHANTIX) 1 MG tablet, Take 1 tablet (1 mg total) by mouth 2 (two) times daily., Disp: 60 tablet, Rfl: 2  Allergies  Allergen Reactions   Penicillins Itching and Swelling    Has patient had a PCN reaction causing immediate rash, facial/tongue/throat swelling, SOB or lightheadedness with hypotension: No Has patient had a PCN reaction causing severe rash involving mucus membranes or skin necrosis: No Has patient had a PCN reaction that required hospitalization: No Has patient had a PCN reaction occurring within the last 10 years: No If all of the above answers are "NO",  then may proceed with Cephalosporin use.     I personally reviewed active problem list, medication list, allergies, family history, social history, health maintenance with the patient/caregiver today.   ROS  Constitutional: Negative for fever , positive for  weight change.  Respiratory: Negative for cough and shortness of breath.   Cardiovascular: Negative for chest pain or palpitations.  Gastrointestinal: Negative for abdominal pain, no bowel changes.  Musculoskeletal: positive  for gait problem but no joint swelling.  Skin: Negative for rash.  Neurological: Negative for dizziness or headache.  No  other specific complaints in a complete review of systems (except as listed in HPI above).   Objective  Vitals:   07/11/22 1402  BP: 132/78  Pulse: 99  Resp: 14  Temp: 98 F (36.7 C)  TempSrc: Oral  SpO2: 99%  Weight: 183 lb 14.4 oz (83.4 kg)  Height: 5\' 1"  (1.549 m)    Body mass index is 34.75 kg/m.  Physical Exam  Constitutional: Patient appears well-developed and well-nourished. Obese  No distress.  HEENT: head atraumatic, normocephalic, pupils equal and reactive to light, neck supple Cardiovascular: Normal rate, regular rhythm and normal heart sounds.  Loud systolic ejection  murmur heard 2nd right intercostal space . No BLE edema. Pulmonary/Chest: Effort normal and breath sounds normal. No respiratory distress. Abdominal: Soft.  There is no tenderness. Psychiatric: Patient has a normal mood and affect. behavior is normal. Judgment and thought content normal.   Recent Results (from the past 2160 hour(s))  Basic metabolic panel     Status: Abnormal   Collection Time: 04/25/22 10:36 PM  Result Value Ref Range   Sodium 143 135 - 145 mmol/L   Potassium 4.2 3.5 - 5.1 mmol/L   Chloride 108 98 - 111 mmol/L   CO2 26 22 - 32 mmol/L   Glucose, Bld 111 (H) 70 - 99 mg/dL    Comment: Glucose reference range applies only to samples taken after fasting for at least 8 hours.    BUN 22 (H) 6 - 20 mg/dL   Creatinine, Ser 0.76 0.44 - 1.00 mg/dL   Calcium 10.2 8.9 - 10.3 mg/dL   GFR, Estimated >60 >60 mL/min    Comment: (NOTE) Calculated using the CKD-EPI Creatinine Equation (2021)    Anion gap 9 5 - 15    Comment: Performed at [facility], [address] [place], [place] [address]  CBC     Status: Abnormal   Collection Time: 04/25/22 10:36 PM  Result Value Ref Range   WBC 8.7 4.0 - 10.5 K/uL   RBC 5.18 (H) 3.87 - 5.11 MIL/uL   Hemoglobin 13.6 12.0 - 15.0 g/dL   HCT 44.6 36.0 - 46.0 %   MCV 86.1 80.0 - 100.0 fL   MCH 26.3 26.0 - 34.0 pg   MCHC 30.5 30.0 - 36.0 g/dL   RDW 14.0 11.5 - 15.5 %   Platelets 309 150 - 400 K/uL   nRBC 0.0 0.0 - 0.2 %    Comment: Performed at [facility], [place], [place] [address]  Troponin I (High Sensitivity)     Status: Abnormal   Collection Time: 04/25/22 10:36 PM  Result Value Ref Range   Troponin I (High Sensitivity) 21 (H) <18 ng/L    Comment: (NOTE) Elevated high sensitivity troponin I (hsTnI) values and significant  changes across serial measurements may suggest ACS but many other  chronic and acute conditions are known to elevate hsTnI results.  Refer to the "Links" section for chest pain algorithms and additional  guidance. Performed at [facility], [place], [place] [address]   Troponin I (High Sensitivity)     Status: Abnormal   Collection Time: 04/26/22  1:43 AM  Result Value Ref Range   Troponin I (High Sensitivity) 22 (H) <18 ng/L    Comment: (NOTE) Elevated high sensitivity troponin I (hsTnI) values and significant  changes across serial measurements may suggest ACS but many other  chronic and acute conditions are known to elevate hsTnI results.  Refer  to the "Links" section for chest pain algorithms and additional  guidance. Performed at [facility], [address] [place], [place] [address]     PHQ2/9:    07/11/2022     2:04 PM 02/07/2022    3:03 PM 12/27/2021    2:37 PM 08/14/2021    8:58 AM 05/14/2021    8:19 AM  Depression screen PHQ 2/9  Decreased Interest 0 0 0 0 0  Down, Depressed, Hopeless 0 0 0 0 0  PHQ - 2 Score 0 0 0 0 0  Altered sleeping 0 0 0 0 0  Tired, decreased energy 0 0 0 0 0  Change in appetite 0 0 0 0 0  Feeling bad or failure about yourself  0 0 0 0 0  Trouble concentrating 0 0 0 0 0  Moving slowly or fidgety/restless 0 0 0 0 0  Suicidal thoughts 0 0 0 0 0  PHQ-9 Score 0 0 0 0 0  Difficult doing work/chores  Not difficult at all Not difficult at all      phq 9 is negative   Fall Risk:    07/11/2022    2:03 PM 02/07/2022    3:02 PM 12/27/2021    2:35 PM 08/14/2021    8:58 AM 05/14/2021    8:19 AM  Fall Risk   Falls in the past year? 0 0 0 0 0  Number falls in past yr:  0 0 0 0  Injury with Fall?  0 0 0 0  Risk for fall due to : No Fall Risks   No Fall Risks No Fall Risks  Follow up Falls prevention discussed;Education provided;Falls evaluation completed   Falls prevention discussed Falls prevention discussed      Functional Status Survey: Is the patient deaf or have difficulty hearing?: No Does the patient have difficulty seeing, even when wearing glasses/contacts?: No Does the patient have difficulty concentrating, remembering, or making decisions?: No Does the patient have difficulty walking or climbing stairs?: No Does the patient have difficulty dressing or bathing?: No Does the patient have difficulty doing errands alone such as visiting a doctor's office or shopping?: No    Assessment & Plan  1. Benign essential hypertension  - Microalbumin / creatinine urine ratio - COMPLETE METABOLIC PANEL WITH GFR - CBC with Differential/Platelet  2. Gastroesophageal reflux disease without esophagitis  Doing well on prn medication   3. Primary osteoarthritis of right hip  S/p hip replacemnet  4. OSA (obstructive sleep apnea)   5. Obesity (BMI 30.0-34.9)  She  has only been using saxenda a few times a week, she also quit smoking, she will resume it daily, and go up to 3 mg daily   6. Pre-diabetes  - Hemoglobin A1c  7. Colon cancer screening  - Ambulatory referral to Gastroenterology  8. Positive for microalbuminuria  We will recheck labs   9. History of right hip replacement  She is off oxycodone   10. Pure hypercholesterolemia  - Lipid panel - rosuvastatin (CRESTOR) 10 MG tablet; Take 1 tablet (10 mg total) by mouth every morning.  Dispense: 90 tablet; Refill: 1  11. Needs flu shot  - Flu Vaccine QUAD 6+ mos PF IM (Fluarix Quad PF)  12. Anxiety  - escitalopram (LEXAPRO) 10 MG tablet; Take 1 tablet (10 mg total) by mouth daily.  Dispense: 90 tablet; Refill: 1

## 2022-07-11 ENCOUNTER — Encounter: Payer: Self-pay | Admitting: Family Medicine

## 2022-07-11 ENCOUNTER — Ambulatory Visit (INDEPENDENT_AMBULATORY_CARE_PROVIDER_SITE_OTHER): Payer: Managed Care, Other (non HMO) | Admitting: Family Medicine

## 2022-07-11 VITALS — BP 132/78 | HR 99 | Temp 98.0°F | Resp 14 | Ht 61.0 in | Wt 183.9 lb

## 2022-07-11 DIAGNOSIS — E669 Obesity, unspecified: Secondary | ICD-10-CM

## 2022-07-11 DIAGNOSIS — Z23 Encounter for immunization: Secondary | ICD-10-CM

## 2022-07-11 DIAGNOSIS — R7303 Prediabetes: Secondary | ICD-10-CM

## 2022-07-11 DIAGNOSIS — R011 Cardiac murmur, unspecified: Secondary | ICD-10-CM

## 2022-07-11 DIAGNOSIS — K219 Gastro-esophageal reflux disease without esophagitis: Secondary | ICD-10-CM

## 2022-07-11 DIAGNOSIS — I10 Essential (primary) hypertension: Secondary | ICD-10-CM | POA: Diagnosis not present

## 2022-07-11 DIAGNOSIS — M1611 Unilateral primary osteoarthritis, right hip: Secondary | ICD-10-CM | POA: Diagnosis not present

## 2022-07-11 DIAGNOSIS — E7800 Pure hypercholesterolemia, unspecified: Secondary | ICD-10-CM

## 2022-07-11 DIAGNOSIS — Z1211 Encounter for screening for malignant neoplasm of colon: Secondary | ICD-10-CM

## 2022-07-11 DIAGNOSIS — I119 Hypertensive heart disease without heart failure: Secondary | ICD-10-CM

## 2022-07-11 DIAGNOSIS — R809 Proteinuria, unspecified: Secondary | ICD-10-CM

## 2022-07-11 DIAGNOSIS — Z96641 Presence of right artificial hip joint: Secondary | ICD-10-CM

## 2022-07-11 DIAGNOSIS — E66811 Obesity, class 1: Secondary | ICD-10-CM

## 2022-07-11 DIAGNOSIS — F419 Anxiety disorder, unspecified: Secondary | ICD-10-CM

## 2022-07-11 DIAGNOSIS — G4733 Obstructive sleep apnea (adult) (pediatric): Secondary | ICD-10-CM

## 2022-07-11 MED ORDER — ESCITALOPRAM OXALATE 10 MG PO TABS: 10.0000 mg | ORAL_TABLET | Freq: Every day | ORAL | 1 refills | Status: DC

## 2022-07-11 MED ORDER — ROSUVASTATIN CALCIUM 10 MG PO TABS: 10.0000 mg | ORAL_TABLET | ORAL | 1 refills | Status: DC

## 2022-07-14 ENCOUNTER — Telehealth: Payer: Self-pay

## 2022-07-14 ENCOUNTER — Other Ambulatory Visit: Payer: Self-pay

## 2022-07-14 DIAGNOSIS — Z1211 Encounter for screening for malignant neoplasm of colon: Secondary | ICD-10-CM

## 2022-07-14 MED ORDER — NA SULFATE-K SULFATE-MG SULF 17.5-3.13-1.6 GM/177ML PO SOLN: 1.0000 | Freq: Once | ORAL | 0 refills | Status: AC

## 2022-07-14 NOTE — Telephone Encounter (Signed)
Gastroenterology Pre-Procedure Review  Request Date: 07/24/22 Requesting Physician: Dr. Tobi Bastos  PATIENT REVIEW QUESTIONS: The patient responded to the following health history questions as indicated:    1. Are you having any GI issues? no 2. Do you have a personal history of Polyps? no 3. Do you have a family history of Colon Cancer or Polyps? no 4. Diabetes Mellitus? no 5. Joint replacements in the past 12 months?no 6. Major health problems in the past 3 months?no 7. Any artificial heart valves, MVP, or defibrillator?no    MEDICATIONS & ALLERGIES:    Patient reports the following regarding taking any anticoagulation/antiplatelet therapy:   Plavix, Coumadin, Eliquis, Xarelto, Lovenox, Pradaxa, Brilinta, or Effient? no Aspirin? no  Patient confirms/reports the following medications:  Current Outpatient Medications  Medication Sig Dispense Refill   escitalopram (LEXAPRO) 10 MG tablet Take 1 tablet (10 mg total) by mouth daily. 90 tablet 1   fluticasone (FLONASE) 50 MCG/ACT nasal spray SHAKE LIQUID AND USE 2 SPRAYS IN EACH NOSTRIL DAILY AS NEEDED FOR ALLERGIES 48 g 0   Liraglutide -Weight Management (SAXENDA) 18 MG/3ML SOPN ADMINISTER 3 MG UNDER THE SKIN DAILY WITH BREAKFAST 15 mL 0   Olmesartan-amLODIPine-HCTZ 40-5-25 MG TABS TAKE 1 TABLET BY MOUTH EVERY MORNING 90 tablet 1   pantoprazole (PROTONIX) 40 MG tablet Take 1 tablet (40 mg total) by mouth daily. 90 tablet 1   rosuvastatin (CRESTOR) 10 MG tablet Take 1 tablet (10 mg total) by mouth every morning. 90 tablet 1   No current facility-administered medications for this visit.    Patient confirms/reports the following allergies:  Allergies  Allergen Reactions   Penicillins Itching and Swelling    Has patient had a PCN reaction causing immediate rash, facial/tongue/throat swelling, SOB or lightheadedness with hypotension: No Has patient had a PCN reaction causing severe rash involving mucus membranes or skin necrosis: No Has  patient had a PCN reaction that required hospitalization: No Has patient had a PCN reaction occurring within the last 10 years: No If all of the above answers are "NO", then may proceed with Cephalosporin use.     No orders of the defined types were placed in this encounter.   AUTHORIZATION INFORMATION Primary Insurance: 1D#: Group #:  Secondary Insurance: 1D#: Group #:  SCHEDULE INFORMATION: Date: 07/24/22 Time: Location: [facility]

## 2022-07-15 ENCOUNTER — Telehealth: Payer: Self-pay | Admitting: Gastroenterology

## 2022-07-15 NOTE — Telephone Encounter (Signed)
Called and talk to vicki in Endo and canceled the procedure for patient.

## 2022-07-15 NOTE — Telephone Encounter (Signed)
Patient would like to cancel her colonoscopy. States she already had one in March and doesn't want or need another one.

## 2022-07-24 ENCOUNTER — Encounter: Admission: RE | Payer: Self-pay | Source: Home / Self Care

## 2022-07-24 ENCOUNTER — Ambulatory Visit
Admission: RE | Admit: 2022-07-24 | Payer: Managed Care, Other (non HMO) | Source: Home / Self Care | Admitting: Gastroenterology

## 2022-07-24 SURGERY — COLONOSCOPY WITH PROPOFOL
Anesthesia: General

## 2022-08-27 ENCOUNTER — Ambulatory Visit: Payer: Managed Care, Other (non HMO) | Attending: Cardiology | Admitting: Cardiology

## 2022-08-27 ENCOUNTER — Other Ambulatory Visit
Admission: RE | Admit: 2022-08-27 | Discharge: 2022-08-27 | Disposition: A | Discharge status: Home / Self Care | Payer: Managed Care, Other (non HMO) | Attending: Cardiology | Admitting: Cardiology

## 2022-08-27 ENCOUNTER — Encounter: Payer: Self-pay | Admitting: Cardiology

## 2022-08-27 VITALS — BP 160/96 | HR 76 | Ht 61.0 in | Wt 186.4 lb

## 2022-08-27 DIAGNOSIS — E7800 Pure hypercholesterolemia, unspecified: Secondary | ICD-10-CM

## 2022-08-27 DIAGNOSIS — R079 Chest pain, unspecified: Secondary | ICD-10-CM | POA: Diagnosis not present

## 2022-08-27 DIAGNOSIS — R072 Precordial pain: Secondary | ICD-10-CM

## 2022-08-27 DIAGNOSIS — I10 Essential (primary) hypertension: Secondary | ICD-10-CM

## 2022-08-27 LAB — BASIC METABOLIC PANEL
Anion gap: 8 (ref 5–15)
BUN: 17 mg/dL (ref 6–20)
CO2: 28 mmol/L (ref 22–32)
Calcium: 10.1 mg/dL (ref 8.9–10.3)
Chloride: 102 mmol/L (ref 98–111)
Creatinine, Ser: 0.79 mg/dL (ref 0.44–1.00)
GFR, Estimated: >60 mL/min (ref >60)
Glucose, Bld: 110 mg/dL — ABNORMAL HIGH (ref 70–99)
Potassium: 4.2 mmol/L (ref 3.5–5.1)
Sodium: 138 mmol/L (ref 135–145)

## 2022-08-27 MED ORDER — METOPROLOL TARTRATE 100 MG PO TABS: ORAL_TABLET | ORAL | 0 refills | Status: DC

## 2022-08-27 NOTE — Progress Notes (Signed)
Cardiology Office Note:    Date:  08/27/2022   ID:  [name], DOB 1966-01-19, MRN [id number]  PCP:  Alba Cory, MD   [facility] Providers Cardiologist:  Debbe Odea, MD     Referring MD: Alba Cory, MD   Chief Complaint  Patient presents with   New Patient (Initial Visit)    Ref by Dr. Carlynn Purl for establish care for heart murmur; former patient of Dr. Arnoldo Hooker. Patient c/o chest pain that comes and goes. Medications reviewed by the patient verbally.    [name] is a 57 y.o. female who is being seen today for the evaluation of systolic murmur at the request of Alba Cory, MD.   History of Present Illness:    [name] is a 57 y.o. female with a hx of hypertension, GERD, hyperlipidemia, former smoker x 25 years who presents with cardiac murmur and chest pain.  She states having on and off chest pains for years.  Usually takes a Bayer aspirin with improvement in symptoms.  Pains are not always associated with exertion.  Saw cardiology about 15-20 years ago, underwent left heart cath which showed no significant obstruction.  Saw primary care physician last month, noted to have systolic murmur on exam.  Past Medical History:  Diagnosis Date   Acid reflux    Arthritis    GAD (generalized anxiety disorder)    Hypertension    OSA (obstructive sleep apnea) 09/08/2019   does not have a cpap   Tobacco use     Past Surgical History:  Procedure Laterality Date   ABDOMINAL HYSTERECTOMY     Complete Hysterectomy   CHOLECYSTECTOMY     GANGLION CYST EXCISION Left 03/01/2018   Procedure: REMOVAL GANGLION OF WRIST;  Surgeon: Deeann Saint, MD;  Location: [facility] ORS;  Service: Orthopedics;  Laterality: Left;   TOTAL HIP ARTHROPLASTY Right 10/10/2020   Procedure: TOTAL HIP ARTHROPLASTY ANTERIOR APPROACH;  Surgeon: Lyndle Herrlich, MD;  Location: [facility] ORS;  Service: Orthopedics;  Laterality: Right;    Current Medications: Current Meds   Medication Sig   escitalopram (LEXAPRO) 10 MG tablet Take 1 tablet (10 mg total) by mouth daily.   fluticasone (FLONASE) 50 MCG/ACT nasal spray SHAKE LIQUID AND USE 2 SPRAYS IN EACH NOSTRIL DAILY AS NEEDED FOR ALLERGIES   Liraglutide -Weight Management (SAXENDA) 18 MG/3ML SOPN ADMINISTER 3 MG UNDER THE SKIN DAILY WITH BREAKFAST   metoprolol tartrate (LOPRESSOR) 100 MG tablet Take one tablet (100 mg) by mouth two hours prior to Cardiac CTA   Olmesartan-amLODIPine-HCTZ 40-5-25 MG TABS TAKE 1 TABLET BY MOUTH EVERY MORNING   pantoprazole (PROTONIX) 40 MG tablet Take 1 tablet (40 mg total) by mouth daily.   rosuvastatin (CRESTOR) 10 MG tablet Take 1 tablet (10 mg total) by mouth every morning.     Allergies:   Penicillins   Social History   Socioeconomic History   Marital status: Married    Spouse name: [name]    Number of children: 3   Years of education: Not on file   Highest education level: 12th grade  Occupational History   Occupation: medicare audits   Tobacco Use   Smoking status: Former    Packs/day: 0.50    Years: 18.00    Total pack years: 9.00    Types: Cigarettes    Start date: 08/19/1999    Quit date: 01/30/2022    Years since quitting: 0.5   Smokeless tobacco: Never  Vaping Use  Vaping Use: Every day   Substances: Nicotine, Flavoring  Substance and Sexual Activity   Alcohol use: Yes    Alcohol/week: 1.0 standard drink of alcohol    Types: 1 Glasses of wine per week    Comment: rare on Saturdays    Drug use: No   Sexual activity: Yes    Partners: Male    Birth control/protection: Other-see comments    Comment: Hysterectomy  Other Topics Concern   Not on file  Social History Narrative   Re-married since 2012   Only the two of them at home   She has three children from previous relationship and he has one   She works a full at [employer] and part time at [employer]    Her only son ( youngest child) went to prison for 3 years ( from 2014 - 2017), since  than she has been anxious and scared of darkness.    Social Determinants of Health   Financial Resource Strain: Low Risk  (08/18/2017)   Overall Financial Resource Strain (CARDIA)    Difficulty of Paying Living Expenses: Not hard at all  Food Insecurity: No Food Insecurity (08/18/2017)   Hunger Vital Sign    Worried About Running Out of Food in the Last Year: Never true    Ran Out of Food in the Last Year: Never true  Transportation Needs: No Transportation Needs (08/18/2017)   PRAPARE - Hydrologist (Medical): No    Lack of Transportation (Non-Medical): No  Physical Activity: Insufficiently Active (08/18/2017)   Exercise Vital Sign    Days of Exercise per Week: 5 days    Minutes of Exercise per Session: 20 min  Stress: Stress Concern Present (08/18/2017)   [place]    Feeling of Stress : Rather much  Social Connections: Socially Integrated (08/18/2017)   Social Connection and Isolation Panel [NHANES]    Frequency of Communication with Friends and Family: More than three times a week    Frequency of Social Gatherings with Friends and Family: Twice a week    Attends Religious Services: More than 4 times per year    Active Member of [employer] or Organizations: Yes    Attends Music therapist: More than 4 times per year    Marital Status: Married     Family History: The patient's family history includes Hypertension in her brother, father, mother, sister, and sister; Stroke in her father. There is no history of Breast cancer.  ROS:   Please see the history of present illness.     All other systems reviewed and are negative.  EKGs/Labs/Other Studies Reviewed:    The following studies were reviewed today:   EKG:  EKG is  ordered today.  The ekg ordered today demonstrates normal sinus rhythm  Recent Labs: 04/25/2022: BUN 22; Creatinine, Ser 0.76; Hemoglobin 13.6; Platelets 309;  Potassium 4.2; Sodium 143  Recent Lipid Panel    Component Value Date/Time   CHOL 139 08/16/2021 1606   TRIG 80 08/16/2021 1606   HDL 52 08/16/2021 1606   CHOLHDL 2.7 08/16/2021 1606   CHOLHDL 3.5 12/22/2018 0938   LDLCALC 71 08/16/2021 1606   LDLCALC 116 (H) 12/22/2018 0938     Risk Assessment/Calculations:    HYPERTENSION CONTROL Vitals:   08/27/22 0842 08/27/22 0843  BP: (!) 158/94 (!) 160/96    The patient's blood pressure is elevated above target today.  In order to  address the patient's elevated BP: Blood pressure will be monitored at home to determine if medication changes need to be made.            Physical Exam:    VS:  BP (!) 160/96 (BP Location: Left Arm, Patient Position: Sitting, Cuff Size: Normal)   Pulse 76   Ht 5\' 1"  (1.549 m)   Wt 186 lb 6 oz (84.5 kg)   SpO2 98%   BMI 35.22 kg/m     Wt Readings from Last 3 Encounters:  08/27/22 186 lb 6 oz (84.5 kg)  07/11/22 183 lb 14.4 oz (83.4 kg)  04/25/22 175 lb (79.4 kg)     GEN:  Well nourished, well developed in no acute distress HEENT: Normal NECK: No JVD; No carotid bruits CARDIAC: RRR, no murmurs, rubs, gallops RESPIRATORY:  Clear to auscultation without rales, wheezing or rhonchi  ABDOMEN: Soft, non-tender, non-distended MUSCULOSKELETAL:  No edema; No deformity  SKIN: Warm and dry NEUROLOGIC:  Alert and oriented x 3 PSYCHIATRIC:  Normal affect   ASSESSMENT:    1. Precordial pain   2. Primary hypertension   3. Pure hypercholesterolemia   4. Chest pain, unspecified type    PLAN:    In order of problems listed above:  Chest pain, risk factors hypertension, hyperlipidemia, former smoker.  Get echocardiogram, get coronary CTA.  No murmur noted on cardiac exam. Hypertension, BP elevated, usually controlled.  Continue olmesartan, HCTZ, Norvasc 5.  If BP stays elevated at follow-up visit, increase Norvasc to 10 mg daily (add norvasc 5mg ). Hyperlipidemia, continue Crestor.  Follow-up after  echo and coronary CTA.      Medication Adjustments/Labs and Tests Ordered: Current medicines are reviewed at length with the patient today.  Concerns regarding medicines are outlined above.  Orders Placed This Encounter  Procedures   CT CORONARY MORPH W/CTA COR W/SCORE W/CA W/CM &/OR WO/CM   Basic Metabolic Panel (BMET)   EKG 12-Lead   ECHOCARDIOGRAM COMPLETE   Meds ordered this encounter  Medications   metoprolol tartrate (LOPRESSOR) 100 MG tablet    Sig: Take one tablet (100 mg) by mouth two hours prior to Cardiac CTA    Dispense:  1 tablet    Refill:  0    Patient Instructions  Medication Instructions:   Your physician recommends that you continue on your current medications as directed. Please refer to the Current Medication list given to you today.  *If you need a refill on your cardiac medications before your next appointment, please call your pharmacy*   Lab Work:  Your physician recommends you go to the medical mall to have lab work completed.  If you have labs (blood work) drawn today and your tests are completely normal, you will receive your results only by: [place] (if you have MyChart) OR A paper copy in the mail If you have any lab test that is abnormal or we need to change your treatment, we will call you to review the results.   Testing/Procedures:  Your physician has requested that you have an echocardiogram. Echocardiography is a painless test that uses sound waves to create images of your heart. It provides your doctor with information about the size and shape of your heart and how well your heart's chambers and valves are working. This procedure takes approximately one hour. There are no restrictions for this procedure. Please do NOT wear cologne, perfume, aftershave, or lotions (deodorant is allowed). Please arrive 15 minutes prior to your appointment time.  Your cardiac CT will be scheduled on 09/08/2022 @ 9:30am [facility] [address] [place], [place] [address] [phone]   If scheduled at [facility] or [facility], please arrive 15 mins early for check-in and test prep.   Please follow these instructions carefully (unless otherwise directed):   On the Night Before the Test: Be sure to Drink plenty of water. Do not consume any caffeinated/decaffeinated beverages or chocolate 12 hours prior to your test. Do not take any antihistamines 12 hours prior to your test.   On the Day of the Test: Drink plenty of water until 1 hour prior to the test. Do not eat any food 1 hour prior to test. You may take your regular medications prior to the test.  Take metoprolol (Lopressor) two hours prior to test. HOLD Furosemide/Hydrochlorothiazide morning of the test. FEMALES- please wear underwire-free bra if available, avoid dresses & tight clothing       After the Test: Drink plenty of water. After receiving IV contrast, you may experience a mild flushed feeling. This is normal. On occasion, you may experience a mild rash up to 24 hours after the test. This is not dangerous. If this occurs, you can take Benadryl 25 mg and increase your fluid intake. If you experience trouble breathing, this can be serious. If it is severe call 911 IMMEDIATELY. If it is mild, please call our office.   For scheduling needs, including cancellations and rescheduling, please call [place], [phone].    Follow-Up: At [facility], you and your health needs are our priority.  As part of our continuing mission to provide you with exceptional heart care, we have created designated Provider Care Teams.  These Care Teams include your primary Cardiologist (physician) and Advanced Practice Providers (APPs -  Physician Assistants and Nurse Practitioners) who all work together to provide you with the care you need, when you need it.  We recommend  signing up for the patient portal called "MyChart".  Sign up information is provided on this After Visit Summary.  MyChart is used to connect with patients for Virtual Visits (Telemedicine).  Patients are able to view lab/test results, encounter notes, upcoming appointments, etc.  Non-urgent messages can be sent to your provider as well.   To learn more about what you can do with MyChart, go to [email or web address].    Your next appointment:   8 - 10  week(s)  Provider:   You may see Kate Sable, MD or one of the following Advanced Practice Providers on your designated Care Team:   Murray Hodgkins, NP Christell Faith, PA-C Cadence Kathlen Mody, PA-C Gerrie Nordmann, NP    Signed, Kate Sable, MD  08/27/2022 9:29 AM    [place]

## 2022-08-27 NOTE — Patient Instructions (Signed)
Medication Instructions:   Your physician recommends that you continue on your current medications as directed. Please refer to the Current Medication list given to you today.  *If you need a refill on your cardiac medications before your next appointment, please call your pharmacy*   Lab Work:  Your physician recommends you go to the medical mall to have lab work completed.  If you have labs (blood work) drawn today and your tests are completely normal, you will receive your results only by: [place] (if you have MyChart) OR A paper copy in the mail If you have any lab test that is abnormal or we need to change your treatment, we will call you to review the results.   Testing/Procedures:  Your physician has requested that you have an echocardiogram. Echocardiography is a painless test that uses sound waves to create images of your heart. It provides your doctor with information about the size and shape of your heart and how well your heart's chambers and valves are working. This procedure takes approximately one hour. There are no restrictions for this procedure. Please do NOT wear cologne, perfume, aftershave, or lotions (deodorant is allowed). Please arrive 15 minutes prior to your appointment time.    Your cardiac CT will be scheduled on 09/08/2022 @ 9:30am [facility] [address] [place], [place] [address] [phone]   If scheduled at [facility] or [facility], please arrive 15 mins early for check-in and test prep.   Please follow these instructions carefully (unless otherwise directed):   On the Night Before the Test: Be sure to Drink plenty of water. Do not consume any caffeinated/decaffeinated beverages or chocolate 12 hours prior to your test. Do not take any antihistamines 12 hours prior to your test.   On the Day of the Test: Drink plenty of water until 1  hour prior to the test. Do not eat any food 1 hour prior to test. You may take your regular medications prior to the test.  Take metoprolol (Lopressor) two hours prior to test. HOLD Furosemide/Hydrochlorothiazide morning of the test. FEMALES- please wear underwire-free bra if available, avoid dresses & tight clothing       After the Test: Drink plenty of water. After receiving IV contrast, you may experience a mild flushed feeling. This is normal. On occasion, you may experience a mild rash up to 24 hours after the test. This is not dangerous. If this occurs, you can take Benadryl 25 mg and increase your fluid intake. If you experience trouble breathing, this can be serious. If it is severe call 911 IMMEDIATELY. If it is mild, please call our office.   For scheduling needs, including cancellations and rescheduling, please call [place], [phone].    Follow-Up: At [facility], you and your health needs are our priority.  As part of our continuing mission to provide you with exceptional heart care, we have created designated Provider Care Teams.  These Care Teams include your primary Cardiologist (physician) and Advanced Practice Providers (APPs -  Physician Assistants and Nurse Practitioners) who all work together to provide you with the care you need, when you need it.  We recommend signing up for the patient portal called "MyChart".  Sign up information is provided on this After Visit Summary.  MyChart is used to connect with patients for Virtual Visits (Telemedicine).  Patients are able to view lab/test results, encounter notes, upcoming appointments, etc.  Non-urgent messages can  be sent to your provider as well.   To learn more about what you can do with MyChart, go to [email or web address].    Your next appointment:   8 - 10  week(s)  Provider:   You may see Kate Sable, MD or one of the following Advanced Practice Providers on your designated Care Team:    Murray Hodgkins, NP Christell Faith, PA-C Cadence Kathlen Mody, PA-C Gerrie Nordmann, NP

## 2022-09-01 ENCOUNTER — Ambulatory Visit: Payer: Managed Care, Other (non HMO) | Admitting: Cardiology

## 2022-09-05 ENCOUNTER — Telehealth (HOSPITAL_COMMUNITY): Payer: Self-pay | Admitting: Emergency Medicine

## 2022-09-05 NOTE — Telephone Encounter (Signed)
Reaching out to patient to offer assistance regarding upcoming cardiac imaging study; pt verbalizes understanding of appt date/time, parking situation and where to check in, pre-test NPO status and medications ordered, and verified current allergies; name and call back number provided for further questions should they arise Chelsea Bond RN Navigator Cardiac Imaging [facility] [phone] office [phone] cell  Arrival 915 [address] 133m metoprolol  Hold olmesartan-amoldipine-HCTZ Aware contrast / nitro Difficutl iv

## 2022-09-08 ENCOUNTER — Ambulatory Visit
Admission: RE | Admit: 2022-09-08 | Discharge: 2022-09-08 | Disposition: A | Discharge status: Home / Self Care | Payer: Managed Care, Other (non HMO) | Source: Ambulatory Visit | Attending: Cardiology | Admitting: Cardiology

## 2022-09-08 DIAGNOSIS — R079 Chest pain, unspecified: Secondary | ICD-10-CM | POA: Insufficient documentation

## 2022-09-08 DIAGNOSIS — I2510 Atherosclerotic heart disease of native coronary artery without angina pectoris: Secondary | ICD-10-CM | POA: Diagnosis not present

## 2022-09-08 MED ORDER — METOPROLOL TARTRATE 5 MG/5ML IV SOLN
10.0000 mg | Freq: Once | INTRAVENOUS | Status: AC
  Administered 2022-09-08: 10 mg via INTRAVENOUS

## 2022-09-08 MED ORDER — IOHEXOL 350 MG/ML SOLN
100.0000 mL | Freq: Once | INTRAVENOUS | Status: AC | PRN
  Administered 2022-09-08: 100 mL via INTRAVENOUS

## 2022-09-08 MED ORDER — NITROGLYCERIN 0.4 MG SL SUBL
0.8000 mg | SUBLINGUAL_TABLET | Freq: Once | SUBLINGUAL | Status: AC
  Administered 2022-09-08: 0.8 mg via SUBLINGUAL

## 2022-09-08 NOTE — Progress Notes (Signed)
Patient tolerated procedure well. Ambulate w/o difficulty. Denies any lightheadedness or being dizzy. Pt denies any pain at this time. Sitting in chair, pt is encouraged to drink additional water throughout the day and reason explained to patient. Patient verbalized understanding and all questions answered. ABC intact. No further needs at this time. Discharge from procedure area w/o issues.  

## 2022-09-12 ENCOUNTER — Telehealth: Payer: Self-pay

## 2022-09-12 MED ORDER — ROSUVASTATIN CALCIUM 20 MG PO TABS: 20.0000 mg | ORAL_TABLET | Freq: Every day | ORAL | 3 refills | Status: DC

## 2022-09-12 MED ORDER — ASPIRIN 81 MG PO TBEC: 81.0000 mg | DELAYED_RELEASE_TABLET | Freq: Every day | ORAL | 3 refills | Status: AC

## 2022-09-12 NOTE — Telephone Encounter (Signed)
Patient was agreeable to start aspirin 81 and increasing Crestor.   Medication list updated. Pharmacy confirmed.   The patient has been notified of the result and verbalized understanding.  All questions (if any) were answered. Janan Ridge, [place] 09/12/2022 8:59 AM

## 2022-09-12 NOTE — Telephone Encounter (Signed)
-----   Message from Kate Sable, MD sent at 09/11/2022  4:45 PM EST ----- Coronary CTA shows mild nonobstructive CAD.  Recommend starting aspirin 81 mg, increase Crestor to 20 mg daily.

## 2022-10-10 NOTE — Progress Notes (Unsigned)
Name: [name]   MRN: [id number]    DOB: 13-May-1966   Date:10/13/2022       Progress Note  Subjective  Chief Complaint  Follow Up  HPI  S/p Right hip Replacement : she had right hip pain for months , finally got right hip replacement in March 2022 by Dr. Harlow Mares however struggling with a limp ( right leg much longer than left ), she is going to pain clinic, she takes Oxycodone usually once a day. Pain is described as aching and current pain is 1/10. Pain goes up to 5/10 She saw podiatrist and had a heel cup and is walking better, only has a limp when walking for a long time   Obesity: BMI above 30 but right under 35. She was going to the weight loss clinic, she states phentermine worked well for her, but since she has been off medication bp is at goal. She states she stopped going to the weight loss clinic because of cost. She became obese in her early 20's and she has tried multiple diets. Such as Weight Watchers, cabbage diet, Atkins , she loses weight on her diet but gains it back. . She tried Ozempic but stopped on her own , she came in in June  2022 and we gave her Kirke Shaggy , she stopped medication again, she would like to try [facility] now.   HTN/CAD: bp is very high today, she has been skipping medication, she will return at the end of the week for bp recheck , she is now taking aspirin and higher dose statin therapy   Lung nodule on CT cardiac calcium score: she used to smoke but quit last year, history 20 plus pack, we will recheck in 6 months    Anxiety:  Symptoms started when her son went to prison around 2014 for selling drugs, he was  out of jail, but he was in a fight and got arrested and they bailed him out,  he fail his probation for positive drug test, he is back in prison, he was out but this summer he was in a big argument with his girlfriend - she had a panic attack and went to [facility]. 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 They have not been fighting lately   GERD: she  is back on PPI but only taking medication prn ,  doing well.   OSA: also has LVH,  hypertension, interrupted sleep - She went to pulmonologist - Dr. Halford Chessman , has moderate OSA, she still does not use her CPAP, she has not been back to pulmonologist . She told me in the past that her insurance did not pay for supplies but she wants to try again   Hyperlipidemia: taking Rosuvastatin and denies side effects, she will have labs done today   Systolic Ejection Murmur: getting louder, she saw cardiologist but echo not done yet   Insomnia: discussed sleep hygiene, at least try a timer on TV , if no improvement try trazodone   Patient Active Problem List   Diagnosis Date Noted   History of total hip replacement, right 10/10/2020   OSA (obstructive sleep apnea) 09/08/2019   Ganglion cyst of dorsum of left wrist 02/05/2018   LVH (left ventricular hypertrophy) due to hypertensive disease, without heart failure 09/23/2017   SOBOE (shortness of breath on exertion) 09/23/2017   Hyperlipidemia, mixed  08/28/2017   Hyperglycemia 08/25/2017   Hypercalcemia due to thiazide and vitamin A 08/25/2017   Hypertension 08/18/2017   GAD (generalized anxiety disorder) 08/18/2017   GERD (gastroesophageal reflux disease) 08/18/2017   Panic attack 123456   Systolic ejection murmur 123456    Past Surgical History:  Procedure Laterality Date   ABDOMINAL HYSTERECTOMY     Complete Hysterectomy   CHOLECYSTECTOMY     GANGLION CYST EXCISION Left 03/01/2018   Procedure: REMOVAL GANGLION OF WRIST;  Surgeon: Earnestine Leys, MD;  Location: [facility] ORS;  Service: Orthopedics;  Laterality: Left;   TOTAL HIP ARTHROPLASTY Right 10/10/2020   Procedure: TOTAL HIP ARTHROPLASTY ANTERIOR APPROACH;  Surgeon: Lovell Sheehan, MD;  Location: [facility] ORS;  Service: Orthopedics;  Laterality: Right;    Family History  Problem Relation  Age of Onset   Hypertension Mother    Stroke Father    Hypertension Father    Hypertension Sister    Hypertension Sister    Hypertension Brother    Breast cancer Neg Hx     Social History   Tobacco Use   Smoking status: Former    Packs/day: 0.50    Years: 18.00    Additional pack years: 0.00    Total pack years: 9.00    Types: Cigarettes    Start date: 08/19/1999    Quit date: 01/30/2022    Years since quitting: 0.7   Smokeless tobacco: Never  Substance Use Topics   Alcohol use: Yes    Alcohol/week: 1.0 standard drink of alcohol    Types: 1 Glasses of wine per week    Comment: rare on Saturdays      Current Outpatient Medications:    aspirin EC 81 MG tablet, Take 1 tablet (81 mg total) by mouth daily. Swallow whole., Disp: 90 tablet, Rfl: 3   escitalopram (LEXAPRO) 10 MG tablet, Take 1 tablet (10 mg total) by mouth daily., Disp: 90 tablet, Rfl: 1   fluticasone (FLONASE) 50 MCG/ACT nasal spray, SHAKE LIQUID AND USE 2 SPRAYS IN EACH NOSTRIL DAILY AS NEEDED FOR ALLERGIES, Disp: 48 g, Rfl: 0   metoprolol tartrate (LOPRESSOR) 100 MG tablet, Take one tablet (100 mg) by mouth two hours prior to Cardiac CTA, Disp: 1 tablet, Rfl: 0   Olmesartan-amLODIPine-HCTZ 40-5-25 MG TABS, TAKE 1 TABLET BY MOUTH EVERY MORNING, Disp: 90 tablet, Rfl: 1   pantoprazole (PROTONIX) 40 MG tablet, Take 1 tablet (40 mg total) by mouth daily., Disp: 90 tablet, Rfl: 1   rosuvastatin (CRESTOR) 20 MG tablet, Take 1 tablet (20 mg total) by mouth daily., Disp: 90 tablet, Rfl: 3   Liraglutide -Weight Management (SAXENDA) 18 MG/3ML SOPN, ADMINISTER 3 MG UNDER THE SKIN DAILY WITH BREAKFAST (Patient not taking: Reported on 10/13/2022), Disp: 15 mL, Rfl: 0  Allergies  Allergen Reactions   Penicillins Itching and Swelling    Has patient had a PCN reaction causing immediate rash, facial/tongue/throat swelling, SOB or lightheadedness with hypotension: No Has patient had a PCN reaction causing severe rash involving mucus  membranes or skin necrosis: No Has patient had a PCN reaction that required hospitalization: No Has patient had a PCN reaction occurring within the last 10 years: No If all of the above answers are "NO", then may proceed with Cephalosporin use.     I personally reviewed active problem list, medication list, allergies, family history, social history, health maintenance with the patient/caregiver today.   ROS  Constitutional: Negative for fever or weight change.  Respiratory: Negative for cough and  shortness of breath.   Cardiovascular: Negative for chest pain or palpitations.  Gastrointestinal: Negative for abdominal pain, no bowel changes.  Musculoskeletal: Negative for gait problem or joint swelling.  Skin: Negative for rash.  Neurological: Negative for dizziness or headache.  No other specific complaints in a complete review of systems (except as listed in HPI above).   Objective  Vitals:   10/13/22 1049 10/13/22 1056  BP: (!) 170/102 (!) 168/98  Pulse: 91   Resp: 16   SpO2: 97%   Weight: 182 lb (82.6 kg)   Height: 5\' 1"  (1.549 m)     Body mass index is 34.39 kg/m.  Physical Exam  Constitutional: Patient appears well-developed and well-nourished. Obese  No distress.  HEENT: head atraumatic, normocephalic, pupils equal and reactive to light, neck supple Cardiovascular: Normal rate, regular rhythm and normal heart sounds. 2 plus Ejection  murmur heard 2nd right intercostal space. No BLE edema. Pulmonary/Chest: Effort normal and breath sounds normal. No respiratory distress. Abdominal: Soft.  There is no tenderness. Psychiatric: Patient has a normal mood and affect. behavior is normal. Judgment and thought content normal.   Recent Results (from the past 2160 hour(s))  Basic Metabolic Panel (BMET)     Status: Abnormal   Collection Time: 08/27/22  9:39 AM  Result Value Ref Range   Sodium 138 135 - 145 mmol/L   Potassium 4.2 3.5 - 5.1 mmol/L   Chloride 102 98 - 111  mmol/L   CO2 28 22 - 32 mmol/L   Glucose, Bld 110 (H) 70 - 99 mg/dL    Comment: Glucose reference range applies only to samples taken after fasting for at least 8 hours.   BUN 17 6 - 20 mg/dL   Creatinine, Ser 0.79 0.44 - 1.00 mg/dL   Calcium 10.1 8.9 - 10.3 mg/dL   GFR, Estimated >60 >60 mL/min    Comment: (NOTE) Calculated using the CKD-EPI Creatinine Equation (2021)    Anion gap 8 5 - 15    Comment: Performed at [facility], [address] [place], [place] [address]    PHQ2/9:    10/13/2022   10:50 AM 07/11/2022    2:04 PM 02/07/2022    3:03 PM 12/27/2021    2:37 PM 08/14/2021    8:58 AM  Depression screen PHQ 2/9  Decreased Interest 0 0 0 0 0  Down, Depressed, Hopeless 0 0 0 0 0  PHQ - 2 Score 0 0 0 0 0  Altered sleeping 3 0 0 0 0  Tired, decreased energy 0 0 0 0 0  Change in appetite 0 0 0 0 0  Feeling bad or failure about yourself  0 0 0 0 0  Trouble concentrating 0 0 0 0 0  Moving slowly or fidgety/restless 0 0 0 0 0  Suicidal thoughts 0 0 0 0 0  PHQ-9 Score 3 0 0 0 0  Difficult doing work/chores   Not difficult at all Not difficult at all     phq 9 is negative   Fall Risk:    10/13/2022   10:50 AM 07/11/2022    2:03 PM 02/07/2022    3:02 PM 12/27/2021    2:35 PM 08/14/2021    8:58 AM  Fall Risk   Falls in the past year? 0 0 0 0 0  Number falls in past yr: 0  0 0 0  Injury with Fall? 0  0 0 0  Risk for fall due to : No Fall  Risks No Fall Risks   No Fall Risks  Follow up Falls prevention discussed Falls prevention discussed;Education provided;Falls evaluation completed   Falls prevention discussed      Functional Status Survey: Is the patient deaf or have difficulty hearing?: No Does the patient have difficulty seeing, even when wearing glasses/contacts?: No Does the patient have difficulty concentrating, remembering, or making decisions?: No Does the patient have difficulty walking or climbing stairs?: No Does the patient have difficulty  dressing or bathing?: No Does the patient have difficulty doing errands alone such as visiting a doctor's office or shopping?: No    Assessment & Plan  1. Benign essential hypertension  - Olmesartan-amLODIPine-HCTZ 40-5-25 MG TABS; Take 1 tablet by mouth every morning.  Dispense: 90 tablet; Refill: 1  2. Gastroesophageal reflux disease without esophagitis  - pantoprazole (PROTONIX) 40 MG tablet; Take 1 tablet (40 mg total) by mouth daily.  Dispense: 90 tablet; Refill: 1  3. Colon cancer screening  - Ambulatory referral to Gastroenterology  4. OSA (obstructive sleep apnea)  She still did not get supplies  5. Pre-diabetes  Trying to lose weight   6. History of right hip replacement  She has a limp when she walks a long time, but no pain  7. Obesity (BMI 30.0-34.9)  - Semaglutide-Weight Management (WEGOVY) 0.25 MG/0.5ML SOAJ; Inject 0.25 mg into the skin once a week.  Dispense: 2 mL; Refill: 0 - Semaglutide-Weight Management (WEGOVY) 0.5 MG/0.5ML SOAJ; Inject 0.5 mg into the skin once a week.  Dispense: 4 mL; Refill: 0  8. GAD (generalized anxiety disorder)  Doing better  9. Insomnia, unspecified type  - traZODone (DESYREL) 50 MG tablet; Take 0.5-1 tablets (25-50 mg total) by mouth at bedtime as needed for sleep.  Dispense: 90 tablet; Refill: 0

## 2022-10-13 ENCOUNTER — Encounter: Payer: Self-pay | Admitting: Family Medicine

## 2022-10-13 ENCOUNTER — Ambulatory Visit (INDEPENDENT_AMBULATORY_CARE_PROVIDER_SITE_OTHER): Payer: Managed Care, Other (non HMO) | Admitting: Family Medicine

## 2022-10-13 VITALS — BP 168/98 | HR 91 | Resp 16 | Ht 61.0 in | Wt 182.0 lb

## 2022-10-13 DIAGNOSIS — E66811 Obesity, class 1: Secondary | ICD-10-CM

## 2022-10-13 DIAGNOSIS — R911 Solitary pulmonary nodule: Secondary | ICD-10-CM

## 2022-10-13 DIAGNOSIS — G4700 Insomnia, unspecified: Secondary | ICD-10-CM

## 2022-10-13 DIAGNOSIS — R7303 Prediabetes: Secondary | ICD-10-CM

## 2022-10-13 DIAGNOSIS — Z1211 Encounter for screening for malignant neoplasm of colon: Secondary | ICD-10-CM | POA: Diagnosis not present

## 2022-10-13 DIAGNOSIS — Z96641 Presence of right artificial hip joint: Secondary | ICD-10-CM

## 2022-10-13 DIAGNOSIS — G4733 Obstructive sleep apnea (adult) (pediatric): Secondary | ICD-10-CM | POA: Diagnosis not present

## 2022-10-13 DIAGNOSIS — K219 Gastro-esophageal reflux disease without esophagitis: Secondary | ICD-10-CM

## 2022-10-13 DIAGNOSIS — I2510 Atherosclerotic heart disease of native coronary artery without angina pectoris: Secondary | ICD-10-CM | POA: Insufficient documentation

## 2022-10-13 DIAGNOSIS — E669 Obesity, unspecified: Secondary | ICD-10-CM

## 2022-10-13 DIAGNOSIS — I10 Essential (primary) hypertension: Secondary | ICD-10-CM | POA: Diagnosis not present

## 2022-10-13 DIAGNOSIS — F411 Generalized anxiety disorder: Secondary | ICD-10-CM

## 2022-10-13 MED ORDER — OLMESARTAN-AMLODIPINE-HCTZ 40-5-25 MG PO TABS: 1.0000 | ORAL_TABLET | ORAL | 1 refills | Status: DC

## 2022-10-13 MED ORDER — TRAZODONE HCL 50 MG PO TABS: 25.0000 mg | ORAL_TABLET | Freq: Every evening | ORAL | 0 refills | Status: DC | PRN

## 2022-10-13 MED ORDER — PANTOPRAZOLE SODIUM 40 MG PO TBEC: 40.0000 mg | DELAYED_RELEASE_TABLET | Freq: Every day | ORAL | 1 refills | Status: DC

## 2022-10-13 MED ORDER — WEGOVY 0.25 MG/0.5ML ~~LOC~~ SOAJ: 0.2500 mg | SUBCUTANEOUS | 0 refills | Status: DC

## 2022-10-13 MED ORDER — WEGOVY 0.5 MG/0.5ML ~~LOC~~ SOAJ
0.5000 mg | SUBCUTANEOUS | 0 refills | Status: DC
  Filled 2022-11-11: qty 2, 28d supply, fill #0

## 2022-10-14 ENCOUNTER — Telehealth: Payer: Self-pay

## 2022-10-14 ENCOUNTER — Other Ambulatory Visit: Payer: Self-pay

## 2022-10-14 DIAGNOSIS — Z1211 Encounter for screening for malignant neoplasm of colon: Secondary | ICD-10-CM

## 2022-10-14 MED ORDER — NA SULFATE-K SULFATE-MG SULF 17.5-3.13-1.6 GM/177ML PO SOLN: 1.0000 | Freq: Once | ORAL | 0 refills | Status: AC

## 2022-10-14 NOTE — Telephone Encounter (Signed)
Gastroenterology Pre-Procedure Review  Request Date: 10/23/22 Requesting Physician: Dr. Vicente Males  PATIENT REVIEW QUESTIONS: The patient responded to the following health history questions as indicated:    1. Are you having any GI issues? no 2. Do you have a personal history of Polyps? no 3. Do you have a family history of Colon Cancer or Polyps? no 4. Diabetes Mellitus? no 5. Joint replacements in the past 12 months?no 6. Major health problems in the past 3 months?no 7. Any artificial heart valves, MVP, or defibrillator?no    MEDICATIONS & ALLERGIES:    Patient reports the following regarding taking any anticoagulation/antiplatelet therapy:   Plavix, Coumadin, Eliquis, Xarelto, Lovenox, Pradaxa, Brilinta, or Effient? no Aspirin? no  Patient confirms/reports the following medications:  Current Outpatient Medications  Medication Sig Dispense Refill   aspirin EC 81 MG tablet Take 1 tablet (81 mg total) by mouth daily. Swallow whole. 90 tablet 3   escitalopram (LEXAPRO) 10 MG tablet Take 1 tablet (10 mg total) by mouth daily. 90 tablet 1   fluticasone (FLONASE) 50 MCG/ACT nasal spray SHAKE LIQUID AND USE 2 SPRAYS IN EACH NOSTRIL DAILY AS NEEDED FOR ALLERGIES 48 g 0   metoprolol tartrate (LOPRESSOR) 100 MG tablet Take one tablet (100 mg) by mouth two hours prior to Cardiac CTA 1 tablet 0   Olmesartan-amLODIPine-HCTZ 40-5-25 MG TABS Take 1 tablet by mouth every morning. 90 tablet 1   pantoprazole (PROTONIX) 40 MG tablet Take 1 tablet (40 mg total) by mouth daily. 90 tablet 1   rosuvastatin (CRESTOR) 20 MG tablet Take 1 tablet (20 mg total) by mouth daily. 90 tablet 3   Semaglutide-Weight Management (WEGOVY) 0.25 MG/0.5ML SOAJ Inject 0.25 mg into the skin once a week. 2 mL 0   Semaglutide-Weight Management (WEGOVY) 0.5 MG/0.5ML SOAJ Inject 0.5 mg into the skin once a week. 4 mL 0   traZODone (DESYREL) 50 MG tablet Take 0.5-1 tablets (25-50 mg total) by mouth at bedtime as needed for sleep. 90  tablet 0   No current facility-administered medications for this visit.    Patient confirms/reports the following allergies:  Allergies  Allergen Reactions   Penicillins Itching and Swelling    Has patient had a PCN reaction causing immediate rash, facial/tongue/throat swelling, SOB or lightheadedness with hypotension: No Has patient had a PCN reaction causing severe rash involving mucus membranes or skin necrosis: No Has patient had a PCN reaction that required hospitalization: No Has patient had a PCN reaction occurring within the last 10 years: No If all of the above answers are "NO", then may proceed with Cephalosporin use.     No orders of the defined types were placed in this encounter.   AUTHORIZATION INFORMATION Primary Insurance: 1D#: Group #:  Secondary Insurance: 1D#: Group #:  SCHEDULE INFORMATION: Date: 10/23/22 Time: Location: [facility]

## 2022-10-15 ENCOUNTER — Other Ambulatory Visit: Payer: Self-pay

## 2022-10-15 ENCOUNTER — Encounter: Payer: Self-pay | Admitting: Family Medicine

## 2022-10-15 DIAGNOSIS — E669 Obesity, unspecified: Secondary | ICD-10-CM

## 2022-10-15 LAB — CBC WITH DIFFERENTIAL/PLATELET
Basophils Absolute: 0 10*3/uL (ref 0.0–0.2)
Basos: 0 %
EOS (ABSOLUTE): 0.1 10*3/uL (ref 0.0–0.4)
Eos: 2 %
Hematocrit: 42.8 % (ref 34.0–46.6)
Hemoglobin: 13.5 g/dL (ref 11.1–15.9)
Immature Grans (Abs): 0 10*3/uL (ref 0.0–0.1)
Immature Granulocytes: 0 %
Lymphocytes Absolute: 2.9 10*3/uL (ref 0.7–3.1)
Lymphs: 46 %
MCH: 26.6 pg (ref 26.6–33.0)
MCHC: 31.5 g/dL (ref 31.5–35.7)
MCV: 84 fL (ref 79–97)
Monocytes Absolute: 0.4 10*3/uL (ref 0.1–0.9)
Monocytes: 6 %
Neutrophils Absolute: 2.9 10*3/uL (ref 1.4–7.0)
Neutrophils: 46 %
Platelets: 293 10*3/uL (ref 150–450)
RBC: 5.07 x10E6/uL (ref 3.77–5.28)
RDW: 13.3 % (ref 11.7–15.4)
WBC: 6.3 10*3/uL (ref 3.4–10.8)

## 2022-10-15 LAB — MICROALBUMIN / CREATININE URINE RATIO
Creatinine, Urine: 163.9 mg/dL
Microalb/Creat Ratio: 26 mg/g creat (ref 0–29)
Microalbumin, Urine: 43 ug/mL

## 2022-10-15 LAB — HEMOGLOBIN A1C
Est. average glucose Bld gHb Est-mCnc: 143 mg/dL
Hgb A1c MFr Bld: 6.6 % — ABNORMAL HIGH (ref 4.8–5.6)

## 2022-10-15 LAB — COMPREHENSIVE METABOLIC PANEL
ALT: 33 IU/L — ABNORMAL HIGH (ref 0–32)
AST: 22 IU/L (ref 0–40)
Albumin/Globulin Ratio: 2 (ref 1.2–2.2)
Albumin: 5 g/dL — ABNORMAL HIGH (ref 3.8–4.9)
Alkaline Phosphatase: 80 IU/L (ref 44–121)
BUN/Creatinine Ratio: 23 (ref 9–23)
BUN: 20 mg/dL (ref 6–24)
Bilirubin Total: 0.6 mg/dL (ref 0.0–1.2)
CO2: 24 mmol/L (ref 20–29)
Calcium: 10.4 mg/dL — ABNORMAL HIGH (ref 8.7–10.2)
Chloride: 101 mmol/L (ref 96–106)
Creatinine, Ser: 0.86 mg/dL (ref 0.57–1.00)
Globulin, Total: 2.5 g/dL (ref 1.5–4.5)
Glucose: 106 mg/dL — ABNORMAL HIGH (ref 70–99)
Potassium: 4.1 mmol/L (ref 3.5–5.2)
Sodium: 140 mmol/L (ref 134–144)
Total Protein: 7.5 g/dL (ref 6.0–8.5)
eGFR: 80 mL/min/{1.73_m2} (ref >59)

## 2022-10-15 LAB — LIPID PANEL
Chol/HDL Ratio: 2.5 ratio (ref 0.0–4.4)
Cholesterol, Total: 162 mg/dL (ref 100–199)
HDL: 66 mg/dL (ref >39)
LDL Chol Calc (NIH): 78 mg/dL (ref 0–99)
Triglycerides: 98 mg/dL (ref 0–149)
VLDL Cholesterol Cal: 18 mg/dL (ref 5–40)

## 2022-10-15 MED ORDER — WEGOVY 0.25 MG/0.5ML ~~LOC~~ SOAJ
0.2500 mg | SUBCUTANEOUS | 0 refills | Status: DC
  Filled 2022-10-15: qty 2, fill #0
  Filled 2022-10-15 (×2): qty 2, 28d supply, fill #0

## 2022-10-15 NOTE — Telephone Encounter (Signed)
Copied from [place] [id number]. Topic: General - Other >> Oct 15, 2022  3:25 PM Ja-Kwan M wrote: Reason for CRM: Pt reports that [name] has the medication and she would like for the Rx to be sent there.

## 2022-10-16 ENCOUNTER — Other Ambulatory Visit: Payer: Self-pay

## 2022-10-17 ENCOUNTER — Ambulatory Visit: Payer: Managed Care, Other (non HMO)

## 2022-10-20 ENCOUNTER — Ambulatory Visit: Payer: Managed Care, Other (non HMO)

## 2022-10-20 VITALS — BP 130/76 | HR 95

## 2022-10-20 DIAGNOSIS — I10 Essential (primary) hypertension: Secondary | ICD-10-CM

## 2022-10-20 NOTE — Progress Notes (Signed)
Patient her for blood check just started new medication Olmesartan-amLODIPine-HCTZ 40-5-25 MG  at last visit due to high blood pressure.  Patient denies any side effects.  Blood pressure today is 130/76 and pulse 95.  Please advise if you would like to make any changes.

## 2022-10-21 ENCOUNTER — Ambulatory Visit: Payer: Managed Care, Other (non HMO)

## 2022-10-22 ENCOUNTER — Encounter: Payer: Self-pay | Admitting: Gastroenterology

## 2022-10-22 NOTE — Anesthesia Preprocedure Evaluation (Signed)
Anesthesia Evaluation  Patient identified by MRN, date of birth, ID band Patient awake    Reviewed: Allergy & Precautions, H&P , NPO status , Patient's Chart, lab work & pertinent test results  History of Anesthesia Complications Negative for: history of anesthetic complications  Airway Mallampati: II  TM Distance: >3 FB Neck ROM: full    Dental  (+) Teeth Intact   Pulmonary sleep apnea , neg COPD, Patient abstained from smoking., former smoker   breath sounds clear to auscultation       Cardiovascular hypertension, (-) angina + DOE  (-) Past MI and (-) Cardiac Stents (-) dysrhythmias  Rhythm:regular Rate:Normal     Neuro/Psych  PSYCHIATRIC DISORDERS Anxiety     negative neurological ROS     GI/Hepatic Neg liver ROS,GERD  ,,  Endo/Other  negative endocrine ROS    Renal/GU      Musculoskeletal   Abdominal   Peds  Hematology negative hematology ROS (+)   Anesthesia Other Findings Past Medical History: No date: Acid reflux No date: Arthritis No date: GAD (generalized anxiety disorder) No date: Hypertension 09/08/2019: OSA (obstructive sleep apnea)     Comment:  does not have a cpap No date: Tobacco use  Past Surgical History: No date: ABDOMINAL HYSTERECTOMY     Comment:  Complete Hysterectomy No date: CHOLECYSTECTOMY 03/01/2018: GANGLION CYST EXCISION; Left     Comment:  Procedure: REMOVAL GANGLION OF WRIST;  Surgeon: Earnestine Leys, MD;  Location: [facility] ORS;  Service: Orthopedics;                Laterality: Left;  BMI    Body Mass Index: 26.15 kg/m      Reproductive/Obstetrics negative OB ROS                              Anesthesia Physical Anesthesia Plan  ASA: II  Anesthesia Plan: General   Post-op Pain Management:    Induction:   PONV Risk Score and Plan: Propofol infusion  Airway Management Planned: Natural Airway  Additional Equipment:    Intra-op Plan:   Post-operative Plan:   Informed Consent:      Dental Advisory Given  Plan Discussed with: Anesthesiologist, CRNA and Surgeon  Anesthesia Plan Comments:          Anesthesia Quick Evaluation

## 2022-10-23 ENCOUNTER — Ambulatory Visit
Admission: RE | Admit: 2022-10-23 | Discharge: 2022-10-23 | Disposition: A | Discharge status: Home / Self Care | Payer: Managed Care, Other (non HMO) | Attending: Gastroenterology | Admitting: Gastroenterology

## 2022-10-23 ENCOUNTER — Encounter
Admission: RE | Disposition: A | Discharge status: Home / Self Care | Payer: Self-pay | Source: Home / Self Care | Attending: Gastroenterology

## 2022-10-23 ENCOUNTER — Ambulatory Visit: Payer: Managed Care, Other (non HMO) | Admitting: Anesthesiology

## 2022-10-23 DIAGNOSIS — F411 Generalized anxiety disorder: Secondary | ICD-10-CM | POA: Diagnosis not present

## 2022-10-23 DIAGNOSIS — E119 Type 2 diabetes mellitus without complications: Secondary | ICD-10-CM | POA: Diagnosis not present

## 2022-10-23 DIAGNOSIS — Z1211 Encounter for screening for malignant neoplasm of colon: Secondary | ICD-10-CM

## 2022-10-23 DIAGNOSIS — D122 Benign neoplasm of ascending colon: Secondary | ICD-10-CM | POA: Diagnosis not present

## 2022-10-23 DIAGNOSIS — D126 Benign neoplasm of colon, unspecified: Secondary | ICD-10-CM | POA: Diagnosis not present

## 2022-10-23 DIAGNOSIS — G4733 Obstructive sleep apnea (adult) (pediatric): Secondary | ICD-10-CM | POA: Insufficient documentation

## 2022-10-23 DIAGNOSIS — I10 Essential (primary) hypertension: Secondary | ICD-10-CM | POA: Diagnosis not present

## 2022-10-23 DIAGNOSIS — K219 Gastro-esophageal reflux disease without esophagitis: Secondary | ICD-10-CM | POA: Insufficient documentation

## 2022-10-23 DIAGNOSIS — Z87891 Personal history of nicotine dependence: Secondary | ICD-10-CM | POA: Diagnosis not present

## 2022-10-23 HISTORY — PX: COLONOSCOPY WITH PROPOFOL: SHX5780

## 2022-10-23 LAB — GLUCOSE, CAPILLARY: Glucose-Capillary: 121 mg/dL — ABNORMAL HIGH (ref 70–99)

## 2022-10-23 SURGERY — COLONOSCOPY WITH PROPOFOL
Anesthesia: General

## 2022-10-23 MED ORDER — DEXMEDETOMIDINE HCL IN NACL 80 MCG/20ML IV SOLN
INTRAVENOUS | Status: AC
  Filled 2022-10-23: qty 20

## 2022-10-23 MED ORDER — DEXAMETHASONE SODIUM PHOSPHATE 10 MG/ML IJ SOLN
INTRAMUSCULAR | Status: DC | PRN
  Administered 2022-10-23: 5 mg via INTRAVENOUS

## 2022-10-23 MED ORDER — PROPOFOL 10 MG/ML IV BOLUS
INTRAVENOUS | Status: AC
  Filled 2022-10-23: qty 20

## 2022-10-23 MED ORDER — ONDANSETRON HCL 4 MG/2ML IJ SOLN
INTRAMUSCULAR | Status: DC | PRN
  Administered 2022-10-23: 4 mg via INTRAVENOUS

## 2022-10-23 MED ORDER — SUCCINYLCHOLINE CHLORIDE 200 MG/10ML IV SOSY
PREFILLED_SYRINGE | INTRAVENOUS | Status: DC | PRN
  Administered 2022-10-23: 100 mg via INTRAVENOUS

## 2022-10-23 MED ORDER — DEXMEDETOMIDINE HCL IN NACL 80 MCG/20ML IV SOLN
INTRAVENOUS | Status: DC | PRN
  Administered 2022-10-23: 8 ug via BUCCAL

## 2022-10-23 MED ORDER — PROPOFOL 10 MG/ML IV BOLUS
INTRAVENOUS | Status: DC | PRN
  Administered 2022-10-23: 100 mg via INTRAVENOUS
  Administered 2022-10-23: 120 ug/kg/min via INTRAVENOUS

## 2022-10-23 MED ORDER — GLYCOPYRROLATE 0.2 MG/ML IJ SOLN
INTRAMUSCULAR | Status: AC
  Filled 2022-10-23: qty 1

## 2022-10-23 MED ORDER — GLYCOPYRROLATE 0.2 MG/ML IJ SOLN
INTRAMUSCULAR | Status: DC | PRN
  Administered 2022-10-23: .2 mg via INTRAVENOUS

## 2022-10-23 MED ORDER — SODIUM CHLORIDE 0.9 % IV SOLN: INTRAVENOUS | Status: DC

## 2022-10-23 MED ORDER — DEXAMETHASONE SODIUM PHOSPHATE 10 MG/ML IJ SOLN
INTRAMUSCULAR | Status: AC
  Filled 2022-10-23: qty 1

## 2022-10-23 MED ORDER — SUCCINYLCHOLINE CHLORIDE 200 MG/10ML IV SOSY
PREFILLED_SYRINGE | INTRAVENOUS | Status: AC
  Filled 2022-10-23: qty 10

## 2022-10-23 MED ORDER — LIDOCAINE HCL (CARDIAC) PF 100 MG/5ML IV SOSY
PREFILLED_SYRINGE | INTRAVENOUS | Status: DC | PRN
  Administered 2022-10-23: 100 mg via INTRAVENOUS

## 2022-10-23 NOTE — Anesthesia Procedure Notes (Signed)
Procedure Name: Intubation Date/Time: 10/23/2022 10:20 AM  Performed by: Cammie Sickle, CRNAPre-anesthesia Checklist: Patient identified, Patient being monitored, Timeout performed, Emergency Drugs available and Suction available Patient Re-evaluated:Patient Re-evaluated prior to induction Oxygen Delivery Method: Circle system utilized Preoxygenation: Pre-oxygenation with 100% oxygen Induction Type: IV induction, Rapid sequence and Cricoid Pressure applied Laryngoscope Size: 3 and McGraph Grade View: Grade I Tube type: Oral Tube size: 7.0 mm Number of attempts: 1 Airway Equipment and Method: Stylet Placement Confirmation: ETT inserted through vocal cords under direct vision, positive ETCO2 and breath sounds checked- equal and bilateral Secured at: 22 cm Tube secured with: Tape Dental Injury: Teeth and Oropharynx as per pre-operative assessment  Comments: Smooth atraumatic intubation, no complications noted.

## 2022-10-23 NOTE — Anesthesia Postprocedure Evaluation (Signed)
Anesthesia Post Note  Patient: [name]  Procedure(s) Performed: COLONOSCOPY WITH PROPOFOL  Patient location during evaluation: Endoscopy Anesthesia Type: General Level of consciousness: awake and alert Pain management: pain level controlled Vital Signs Assessment: post-procedure vital signs reviewed and stable Respiratory status: spontaneous breathing, nonlabored ventilation and respiratory function stable Cardiovascular status: blood pressure returned to baseline and stable Postop Assessment: no apparent nausea or vomiting Anesthetic complications: no   No notable events documented.   Last Vitals:  Vitals:   10/23/22 1108 10/23/22 1109  BP: 99/72 104/73  Pulse: 86   Resp: (!) 25   Temp:    SpO2: 98%     Last Pain:  Vitals:   10/23/22 1108  TempSrc:   PainSc: 0-No pain                 [place] Ouch

## 2022-10-23 NOTE — H&P (Signed)
Jonathon Bellows, MD [address], [place], [place], [place], [address] [id number], [place], [place], [place], [address] Phone: [phone]  Fax: [phone]  Primary Care Physician:  Steele Sizer, MD   Pre-Procedure History & Physical: HPI:  [name] is a 57 y.o. female is here for an colonoscopy.   Past Medical History:  Diagnosis Date   Acid reflux    Arthritis    GAD (generalized anxiety disorder)    Hypertension    OSA (obstructive sleep apnea) 09/08/2019   does not have a cpap   Tobacco use     Past Surgical History:  Procedure Laterality Date   ABDOMINAL HYSTERECTOMY     Complete Hysterectomy   CHOLECYSTECTOMY     GANGLION CYST EXCISION Left 03/01/2018   Procedure: REMOVAL GANGLION OF WRIST;  Surgeon: Earnestine Leys, MD;  Location: [facility] ORS;  Service: Orthopedics;  Laterality: Left;   JOINT REPLACEMENT     TOTAL HIP ARTHROPLASTY Right 10/10/2020   Procedure: TOTAL HIP ARTHROPLASTY ANTERIOR APPROACH;  Surgeon: Lovell Sheehan, MD;  Location: [facility] OR[facility];  Service: Orthopedics;  Laterality: Right;    Prior to Admission medications   Medication Sig Start Date End Date Taking? Authorizing Provider  metoprolol tartrate (LOPRESSOR) 100 MG tablet Take one tablet (100 mg) by mouth two hours prior to Cardiac CTA 08/27/22  Yes Agbor-Etang, Aaron Edelman, MD  Olmesartan-amLODIPine-HCTZ 40-5-25 MG TABS Take 1 tablet by mouth every morning. 10/13/22  Yes Sowles, Drue Stager, MD  rosuvastatin (CRESTOR) 20 MG tablet Take 1 tablet (20 mg total) by mouth daily. 09/12/22 09/07/23 Yes Kate Sable, MD  aspirin EC 81 MG tablet Take 1 tablet (81 mg total) by mouth daily. Swallow whole. 09/12/22   Kate Sable, MD  escitalopram (LEXAPRO) 10 MG tablet Take 1 tablet (10 mg total) by mouth daily. 07/11/22   Sowles, Drue Stager, MD  fluticasone (FLONASE) 50 MCG/ACT nasal spray SHAKE LIQUID AND USE 2 SPRAYS IN EACH NOSTRIL DAILY AS NEEDED FOR ALLERGIES 04/02/21   Steele Sizer, MD   pantoprazole (PROTONIX) 40 MG tablet Take 1 tablet (40 mg total) by mouth daily. 10/13/22   Steele Sizer, MD  Semaglutide-Weight Management (WEGOVY) 0.25 MG/0.5ML SOAJ Inject 0.25 mg into the skin once a week. 10/15/22   Steele Sizer, MD  Semaglutide-Weight Management (WEGOVY) 0.5 MG/0.5ML SOAJ Inject 0.5 mg into the skin once a week. Patient not taking: Reported on 10/23/2022 10/13/22   Steele Sizer, MD  traZODone (DESYREL) 50 MG tablet Take 0.5-1 tablets (25-50 mg total) by mouth at bedtime as needed for sleep. 10/13/22   Steele Sizer, MD    Allergies as of 10/14/2022 - Review Complete 10/13/2022  Allergen Reaction Noted   Penicillins Itching and Swelling 04/04/2016    Family History  Problem Relation Age of Onset   Hypertension Mother    Stroke Father    Hypertension Father    Hypertension Sister    Hypertension Sister    Hypertension Brother    Breast cancer Neg Hx     Social History   Socioeconomic History   Marital status: Married    Spouse name: [name]    Number of children: 3   Years of education: Not on file   Highest education level: 12th grade  Occupational History   Occupation: medicare audits   Tobacco Use   Smoking status: Former    Packs/day: 0.50    Years: 18.00    Additional pack years: 0.00    Total pack years: 9.00  Types: Cigarettes    Start date: 08/19/1999    Quit date: 01/30/2022    Years since quitting: 0.7   Smokeless tobacco: Never  Vaping Use   Vaping Use: Every day   Substances: Nicotine, Flavoring  Substance and Sexual Activity   Alcohol use: Yes    Alcohol/week: 1.0 standard drink of alcohol    Types: 1 Glasses of wine per week    Comment: rare on Saturdays    Drug use: No   Sexual activity: Yes    Partners: Male    Birth control/protection: Other-see comments    Comment: Hysterectomy  Other Topics Concern   Not on file  Social History Narrative   Re-married since 2012   Only the two of them at home   She has three  children from previous relationship and he has one   She works a full at [employer] and part time at [employer]    Her only son ( youngest child) went to prison for 3 years ( from 2014 - 2017), since than she has been anxious and scared of darkness.    Social Determinants of Health   Financial Resource Strain: Low Risk  (08/18/2017)   Overall Financial Resource Strain (CARDIA)    Difficulty of Paying Living Expenses: Not hard at all  Food Insecurity: No Food Insecurity (08/18/2017)   Hunger Vital Sign    Worried About Running Out of Food in the Last Year: Never true    Ran Out of Food in the Last Year: Never true  Transportation Needs: No Transportation Needs (08/18/2017)   PRAPARE - Hydrologist (Medical): No    Lack of Transportation (Non-Medical): No  Physical Activity: Insufficiently Active (08/18/2017)   Exercise Vital Sign    Days of Exercise per Week: 5 days    Minutes of Exercise per Session: 20 min  Stress: Stress Concern Present (08/18/2017)   [place]    Feeling of Stress : Rather much  Social Connections: Socially Integrated (08/18/2017)   Social Connection and Isolation Panel [NHANES]    Frequency of Communication with Friends and Family: More than three times a week    Frequency of Social Gatherings with Friends and Family: Twice a week    Attends Religious Services: More than 4 times per year    Active Member of [employer] or Organizations: Yes    Attends Music therapist: More than 4 times per year    Marital Status: Married  Human resources officer Violence: Not At Risk (08/18/2017)   Humiliation, Afraid, Rape, and Kick questionnaire    Fear of Current or Ex-Partner: No    Emotionally Abused: No    Physically Abused: No    Sexually Abused: No    Review of Systems: See HPI, otherwise negative ROS  Physical Exam: Ht 5\' 1"  (1.549 m)   Wt 78.9 kg   BMI 32.88  kg/m  General:   Alert,  pleasant and cooperative in NAD Head:  Normocephalic and atraumatic. Neck:  Supple; no masses or thyromegaly. Lungs:  Clear throughout to auscultation, normal respiratory effort.    Heart:  +S1, +S2, Regular rate and rhythm, No edema. Abdomen:  Soft, nontender and nondistended. Normal bowel sounds, without guarding, and without rebound.   Neurologic:  Alert and  oriented x4;  grossly normal neurologically.  Impression/Plan: [name] is here for an colonoscopy to be performed for Screening colonoscopy average risk  Risks, benefits, limitations, and alternatives regarding  colonoscopy have been reviewed with the patient.  Questions have been answered.  All parties agreeable.   Jonathon Bellows, MD  10/23/2022, 9:40 AM

## 2022-10-23 NOTE — Op Note (Signed)
[facility] Gastroenterology Patient Name: [name] Procedure Date: 10/23/2022 10:08 AM MRN: [id number] Account #: [id number] Date of Birth: 10-04-1965 Admit Type: Outpatient Age: 57 [facility]OM 3 Gender: Female Note Status: Finalized Instrument Name: Jasper Riling [id number] Procedure:             Colonoscopy Indications:           Screening for colorectal malignant neoplasm Providers:             Jonathon Bellows MD, MD Referring MD:          Bethena Roys. Sowles, MD (Referring MD) Medicines:             Monitored Anesthesia Care Complications:         No immediate complications. Procedure:             Pre-Anesthesia Assessment:                        - Prior to the procedure, a History and Physical was                         performed, and patient medications, allergies and                         sensitivities were reviewed. The patient's tolerance                         of previous anesthesia was reviewed.                        - The risks and benefits of the procedure and the                         sedation options and risks were discussed with the                         patient. All questions were answered and informed                         consent was obtained.                        - After reviewing the risks and benefits, the patient                         was deemed in satisfactory condition to undergo the                         procedure.                        - ASA Grade Assessment: II - A patient with mild                         systemic disease.                        After obtaining informed consent, the colonoscope was                         passed under direct vision.  Throughout the procedure,                         the patient's blood pressure, pulse, and oxygen                         saturations were monitored continuously. The                         Colonoscope was introduced through the anus and                          advanced to the the cecum, identified by the                         appendiceal orifice. The colonoscopy was performed                         with ease. The patient tolerated the procedure well.                         The quality of the bowel preparation was excellent.                         The ileocecal valve, appendiceal orifice, and rectum                         were photographed. Findings:      The perianal and digital rectal examinations were normal.      A 7 mm polyp was found in the ascending colon. The polyp was sessile.       The polyp was removed with a cold snare. Resection and retrieval were       complete.      The exam was otherwise without abnormality on direct and retroflexion       views. Impression:            - One 7 mm polyp in the ascending colon, removed with                         a cold snare. Resected and retrieved.                        - The examination was otherwise normal on direct and                         retroflexion views. Recommendation:        - Discharge patient to home (with escort).                        - Resume previous diet.                        - Continue present medications.                        - Await pathology results.                        - Repeat colonoscopy for surveillance based on  pathology results. Procedure Code(s):     --- Professional ---                        [phone], Colonoscopy, flexible; with removal of                         tumor(s), polyp(s), or other lesion(s) by snare                         technique Diagnosis Code(s):     --- Professional ---                        Z12.11, Encounter for screening for malignant neoplasm                         of colon                        D12.2, Benign neoplasm of ascending colon CPT copyright 2022 American Medical Association. All rights reserved. The codes documented in this report are preliminary and upon coder review may  be revised to  meet current compliance requirements. Jonathon Bellows, MD Jonathon Bellows MD, MD 10/23/2022 10:44:20 AM This report has been signed electronically. Number of Addenda: 0 Note Initiated On: 10/23/2022 10:08 AM Scope Withdrawal Time: 0 hours 12 minutes 46 seconds  Total Procedure Duration: 0 hours 14 minutes 8 seconds  Estimated Blood Loss:  Estimated blood loss: none.      [facility]

## 2022-10-23 NOTE — Transfer of Care (Signed)
Immediate Anesthesia Transfer of Care Note  Patient: [name]  Procedure(s) Performed: COLONOSCOPY WITH PROPOFOL  Patient Location: PACU  Anesthesia Type:General  Level of Consciousness: drowsy  Airway & Oxygen Therapy: Patient Spontanous Breathing  Post-op Assessment: Report given to RN and Post -op Vital signs reviewed and stable  Post vital signs: Reviewed and stable  Last Vitals:  Vitals Value Taken Time  BP 101/62 10/23/22 1049  Temp 35.7 C 10/23/22 1048  Pulse 89 10/23/22 1048  Resp 23 10/23/22 1048  SpO2 96 % 10/23/22 1048    Last Pain:  Vitals:   10/23/22 1048  TempSrc: Temporal  PainSc: Asleep         Complications: No notable events documented.

## 2022-10-24 ENCOUNTER — Ambulatory Visit: Payer: Managed Care, Other (non HMO) | Admitting: Cardiology

## 2022-10-24 LAB — SURGICAL PATHOLOGY

## 2022-10-27 ENCOUNTER — Encounter: Payer: Self-pay | Admitting: Gastroenterology

## 2022-11-10 ENCOUNTER — Other Ambulatory Visit: Payer: Self-pay

## 2022-11-10 ENCOUNTER — Other Ambulatory Visit: Payer: Self-pay | Admitting: Family Medicine

## 2022-11-10 DIAGNOSIS — E66811 Obesity, class 1: Secondary | ICD-10-CM

## 2022-11-10 DIAGNOSIS — E669 Obesity, unspecified: Secondary | ICD-10-CM

## 2022-11-11 ENCOUNTER — Other Ambulatory Visit: Payer: Self-pay

## 2022-11-11 ENCOUNTER — Other Ambulatory Visit: Payer: Self-pay | Admitting: Family Medicine

## 2022-11-11 DIAGNOSIS — E669 Obesity, unspecified: Secondary | ICD-10-CM

## 2022-11-11 MED ORDER — WEGOVY 0.5 MG/0.5ML ~~LOC~~ SOAJ
0.5000 mg | SUBCUTANEOUS | 0 refills | Status: DC
  Filled 2022-11-11: qty 2, 28d supply, fill #0
  Filled 2022-12-06 – 2022-12-23 (×10): qty 2, 28d supply, fill #1

## 2022-11-11 MED ORDER — WEGOVY 0.5 MG/0.5ML ~~LOC~~ SOAJ: 0.5000 mg | SUBCUTANEOUS | 0 refills | Status: DC

## 2022-11-12 ENCOUNTER — Other Ambulatory Visit: Payer: Self-pay

## 2022-11-13 ENCOUNTER — Other Ambulatory Visit: Payer: Self-pay

## 2022-11-20 ENCOUNTER — Ambulatory Visit: Payer: Managed Care, Other (non HMO) | Attending: Cardiology

## 2022-11-20 DIAGNOSIS — R072 Precordial pain: Secondary | ICD-10-CM

## 2022-11-20 LAB — ECHOCARDIOGRAM COMPLETE
AR max vel: 2.1 cm2
AV Area VTI: 2.13 cm2
AV Area mean vel: 2.07 cm2
AV Mean grad: 6 mmHg
AV Peak grad: 10.8 mmHg
Ao pk vel: 1.64 m/s
Area-P 1/2: 4.1 cm2
Calc EF: 55.7 %
S' Lateral: 2.7 cm
Single Plane A2C EF: 57.1 %
Single Plane A4C EF: 53.7 %

## 2022-11-27 ENCOUNTER — Ambulatory Visit: Payer: Managed Care, Other (non HMO) | Admitting: Cardiology

## 2022-12-07 ENCOUNTER — Other Ambulatory Visit: Payer: Self-pay

## 2022-12-08 ENCOUNTER — Other Ambulatory Visit: Payer: Self-pay

## 2022-12-09 ENCOUNTER — Other Ambulatory Visit: Payer: Self-pay

## 2022-12-10 ENCOUNTER — Other Ambulatory Visit: Payer: Self-pay

## 2022-12-11 ENCOUNTER — Other Ambulatory Visit: Payer: Self-pay

## 2022-12-12 ENCOUNTER — Other Ambulatory Visit: Payer: Self-pay

## 2022-12-15 ENCOUNTER — Other Ambulatory Visit: Payer: Self-pay

## 2022-12-16 ENCOUNTER — Other Ambulatory Visit: Payer: Self-pay

## 2022-12-17 ENCOUNTER — Other Ambulatory Visit: Payer: Self-pay

## 2022-12-18 ENCOUNTER — Other Ambulatory Visit: Payer: Self-pay

## 2022-12-23 ENCOUNTER — Other Ambulatory Visit: Payer: Self-pay

## 2022-12-24 ENCOUNTER — Other Ambulatory Visit (HOSPITAL_COMMUNITY): Payer: Self-pay

## 2022-12-24 ENCOUNTER — Other Ambulatory Visit: Payer: Self-pay

## 2022-12-25 ENCOUNTER — Other Ambulatory Visit: Payer: Self-pay

## 2022-12-26 ENCOUNTER — Other Ambulatory Visit: Payer: Self-pay

## 2023-01-14 NOTE — Progress Notes (Signed)
Name: [name]   MRN: [id number]    DOB: 1965-12-20   Date:01/15/2023       Progress Note  Subjective  Chief Complaint  Follow Up  HPI  DMII: she is already taking Wegovy for weight loss, she lost 9 lbs in the past 3 months, she has a long history of pre diabetes, but last A1C was 6.6 %. LDL is slightly above goal at 78, she is taking statins and changed diet plus losing weight therefore we will hold off on adjusting dose of medication. She also has HTN and is taking Tribenzor, bp still slightly above goal but we hope it will improve with weight loss.   S/p Right hip Replacement : she had right hip pain for months , finally got right hip replacement in March 2022 by Dr. Odis Luster however was still struggling with pain , she was on oxycodone but stopped going and is now only taking tylenol or aleve prn pain  Worse when sitting for a long time or walking more than usual   Obesity: BMI above 30 but right under 35. She was going to the weight loss clinic, she states phentermine worked well for her, but since she has been off medication bp is at goal. She states she stopped going to the weight loss clinic because of cost. She became obese in her early 20's and she has tried multiple diets. Such as Weight Watchers, cabbage diet, Atkins , she loses weight on her diet but gains it back. . She tried Ozempic but stopped on her own , she came in in June  2022 and we gave her Bernie Covey , she stopped medication again. She started on [facility] March 2024 - there was a gap but now is back on medication , she wants to go up to 1 mg dose today   HTN/CAD: bp is better today,  she stopped skipping medication, she did not take it this am because she likes to take it with food. Continue Tribenzor  and aspirin , plus statin therapy. Goal LDL is below 70 - rx given by cardiologist, we will adjust dose to 40 mg if still elevated next time we check   Lung nodule on CT cardiac calcium score: she used to smoke but quit last  year, history 20 plus pack, we will recheck in 6 months    Anxiety:  Symptoms started when her son went to prison around 2014 for selling drugs, he was  out of jail, but he was in a fight and got arrested and they bailed him out,  he fail his probation for positive drug test, he is back in prison, he was out but this summer he was in a big argument with his girlfriend - she had a panic attack and went to [facility]. Marland Kitchen She is on Lexapro, her son is still stressing her our, he has a food truck now but still lives with her - there is also drama -he also wakes up when he goes out to smoke   GERD: she  is back on PPI but only taking medication prn ,stable    OSA: also has LVH,  hypertension, interrupted sleep - She went to pulmonologist - Dr. Craige Cotta , has moderate OSA, she still does not use her CPAP, she has not been back to pulmonologist . She told me in the past that her insurance did not pay for supplies but she wants to try again   Hyperlipidemia: taking Rosuvastatin and denies side effects, she  will have labs done today   Systolic Ejection Murmur: had echo done 10/2022 mild mitral valve regurgitation   Insomnia: discussed sleep hygiene, at least try a timer on TV , she states felt groggy the following day, since she seems to be anxious we will switch to hydroxizine to take prn at night   Patient Active Problem List   Diagnosis Date Noted   Encounter for screening colonoscopy 10/23/2022   Adenomatous polyp of colon 10/23/2022   Coronary artery disease involving native coronary artery of native heart without angina pectoris 10/13/2022   Lung nodule 10/13/2022   Insomnia 10/13/2022   Pre-diabetes 10/13/2022   Obesity (BMI 30.0-34.9) 10/13/2022   History of right hip replacement 10/10/2020   OSA (obstructive sleep apnea) 09/08/2019   Ganglion cyst of dorsum of left wrist 02/05/2018   LVH (left ventricular hypertrophy) due to hypertensive disease, without heart failure 09/23/2017   SOBOE (shortness of  breath on exertion) 09/23/2017   Hyperlipidemia, mixed 08/28/2017   Hyperglycemia 08/25/2017   Hypercalcemia due to thiazide and vitamin A 08/25/2017   Benign essential hypertension 08/18/2017   GAD (generalized anxiety disorder) 08/18/2017   GERD (gastroesophageal reflux disease) 08/18/2017   Panic attack 08/18/2017   Systolic ejection murmur 08/18/2017    Past Surgical History:  Procedure Laterality Date   ABDOMINAL HYSTERECTOMY     Complete Hysterectomy   CHOLECYSTECTOMY     COLONOSCOPY WITH PROPOFOL N/A 10/23/2022   Procedure: COLONOSCOPY WITH PROPOFOL;  Surgeon: Wyline Mood, MD;  Location: [facility] ENDOSCOPY;  Service: Gastroenterology;  Laterality: N/A;   GANGLION CYST EXCISION Left 03/01/2018   Procedure: REMOVAL GANGLION OF WRIST;  Surgeon: Deeann Saint, MD;  Location: [facility] ORS;  Service: Orthopedics;  Laterality: Left;   JOINT REPLACEMENT     TOTAL HIP ARTHROPLASTY Right 10/10/2020   Procedure: TOTAL HIP ARTHROPLASTY ANTERIOR APPROACH;  Surgeon: Lyndle Herrlich, MD;  Location: [facility] ORS;  Service: Orthopedics;  Laterality: Right;    Family History  Problem Relation Age of Onset   Hypertension Mother    Stroke Father    Hypertension Father    Hypertension Sister    Hypertension Sister    Hypertension Brother    Breast cancer Neg Hx     Social History   Tobacco Use   Smoking status: Former    Packs/day: 0.50    Years: 18.00    Additional pack years: 0.00    Total pack years: 9.00    Types: Cigarettes    Start date: 08/19/1999    Quit date: 01/30/2022    Years since quitting: 0.9   Smokeless tobacco: Never  Substance Use Topics   Alcohol use: Yes    Alcohol/week: 1.0 standard drink of alcohol    Types: 1 Glasses of wine per week    Comment: rare on Saturdays      Current Outpatient Medications:    aspirin EC 81 MG tablet, Take 1 tablet (81 mg total) by mouth daily. Swallow whole., Disp: 90 tablet, Rfl: 3   escitalopram (LEXAPRO) 10 MG tablet, Take 1 tablet  (10 mg total) by mouth daily., Disp: 90 tablet, Rfl: 1   fluticasone (FLONASE) 50 MCG/ACT nasal spray, SHAKE LIQUID AND USE 2 SPRAYS IN EACH NOSTRIL DAILY AS NEEDED FOR ALLERGIES, Disp: 48 g, Rfl: 0   Olmesartan-amLODIPine-HCTZ 40-5-25 MG TABS, Take 1 tablet by mouth every morning., Disp: 90 tablet, Rfl: 1   pantoprazole (PROTONIX) 40 MG tablet, Take 1 tablet (40 mg total) by mouth daily., Disp: 90  tablet, Rfl: 1   rosuvastatin (CRESTOR) 20 MG tablet, Take 1 tablet (20 mg total) by mouth daily., Disp: 90 tablet, Rfl: 3   Semaglutide-Weight Management (WEGOVY) 0.5 MG/0.5ML SOAJ, Inject 0.5 mg into the skin once a week., Disp: 4 mL, Rfl: 0   traZODone (DESYREL) 50 MG tablet, Take 0.5-1 tablets (25-50 mg total) by mouth at bedtime as needed for sleep., Disp: 90 tablet, Rfl: 0   metoprolol tartrate (LOPRESSOR) 100 MG tablet, Take one tablet (100 mg) by mouth two hours prior to Cardiac CTA (Patient not taking: Reported on 01/15/2023), Disp: 1 tablet, Rfl: 0  Allergies  Allergen Reactions   Penicillins Itching and Swelling    Has patient had a PCN reaction causing immediate rash, facial/tongue/throat swelling, SOB or lightheadedness with hypotension: No Has patient had a PCN reaction causing severe rash involving mucus membranes or skin necrosis: No Has patient had a PCN reaction that required hospitalization: No Has patient had a PCN reaction occurring within the last 10 years: No If all of the above answers are "NO", then may proceed with Cephalosporin use.     I personally reviewed active problem list, medication list, allergies, family history, social history, health maintenance with the patient/caregiver today.   ROS  Ten systems reviewed and is negative except as mentioned in HPI   Objective  Vitals:   01/15/23 0840  BP: 136/84  Pulse: 98  Resp: 16  SpO2: 99%  Weight: 173 lb (78.5 kg)  Height: 5\' 1"  (1.549 m)    Body mass index is 32.69 kg/m.  Physical Exam  Constitutional:  Patient appears well-developed and well-nourished. Obese  No distress.  HEENT: head atraumatic, normocephalic, pupils equal and reactive to light, neck supple Cardiovascular: Normal rate, regular rhythm and normal heart sounds.  No murmur heard. No BLE edema. Pulmonary/Chest: Effort normal and breath sounds normal. No respiratory distress. Abdominal: Soft.  There is no tenderness. Psychiatric: Patient has a normal mood and affect. behavior is normal. Judgment and thought content normal  Recent Results (from the past 2160 hour(s))  Glucose, capillary     Status: Abnormal   Collection Time: 10/23/22  9:51 AM  Result Value Ref Range   Glucose-Capillary 121 (H) 70 - 99 mg/dL    Comment: Glucose reference range applies only to samples taken after fasting for at least 8 hours.  Surgical pathology     Status: None   Collection Time: 10/23/22 10:30 AM  Result Value Ref Range   SURGICAL PATHOLOGY      SURGICAL PATHOLOGY CASE: [id number] PATIENT: Delane Ginger Surgical Pathology Report     Specimen Submitted: A. Colon polyp, ascending; cold snare  Clinical History: Screening colonoscopy.  Polyps    DIAGNOSIS: A. COLON POLYP, DESCENDING; COLD SNARE: - TUBULAR ADENOMA. - NEGATIVE FOR HIGH-GRADE DYSPLASIA AND MALIGNANCY.  GROSS DESCRIPTION: A. Labeled: Cold snared ascending colon polyp Received: Formalin Collection time: 10:30 AM on 10/23/2022 Placed into formalin time: 10:30 AM on 10/23/2022 Tissue fragment(s): 1 Size: 0.4 x 0.4 x 0.1 cm Description: Tan soft tissue fragment Entirely submitted in 1 cassette.  RB 10/23/2022  Final Diagnosis performed by Katherine Mantle, MD.   Electronically signed 10/24/2022 11:23:31AM The electronic signature indicates that the named Attending Pathologist has evaluated the specimen Technical component performed at [facility], [address], [place], [place] [address] Lab: [phone] Dir: Jolene Schimke, MD, MMM  Profession al component performed at  [facility], [facility], [address] [place], [place], [place] [address] Lab: [phone] Dir: Azalee Course.  Yetta Barre, MD   ECHOCARDIOGRAM COMPLETE     Status: None   Collection Time: 11/20/22  8:06 AM  Result Value Ref Range   AR max vel 2.10 cm2   AV Peak grad 10.8 mmHg   Ao pk vel 1.64 m/s   S' Lateral 2.70 cm   Area-P 1/2 4.10 cm2   AV Area VTI 2.13 cm2   AV Mean grad 6.0 mmHg   Single Plane A4C EF 53.7 %   Single Plane A2C EF 57.1 %   Calc EF 55.7 %   AV Area mean vel 2.07 cm2   Est EF 60 - 65%     PHQ2/9:    01/15/2023    8:40 AM 10/13/2022   10:50 AM 07/11/2022    2:04 PM 02/07/2022    3:03 PM 12/27/2021    2:37 PM  Depression screen PHQ 2/9  Decreased Interest 0 0 0 0 0  Down, Depressed, Hopeless 0 0 0 0 0  PHQ - 2 Score 0 0 0 0 0  Altered sleeping 1 3 0 0 0  Tired, decreased energy 0 0 0 0 0  Change in appetite 0 0 0 0 0  Feeling bad or failure about yourself  0 0 0 0 0  Trouble concentrating 0 0 0 0 0  Moving slowly or fidgety/restless 0 0 0 0 0  Suicidal thoughts 0 0 0 0 0  PHQ-9 Score 1 3 0 0 0  Difficult doing work/chores    Not difficult at all Not difficult at all    phq 9 is negative   Fall Risk:    01/15/2023    8:40 AM 10/13/2022   10:50 AM 07/11/2022    2:03 PM 02/07/2022    3:02 PM 12/27/2021    2:35 PM  Fall Risk   Falls in the past year? 0 0 0 0 0  Number falls in past yr: 0 0  0 0  Injury with Fall? 0 0  0 0  Risk for fall due to : No Fall Risks No Fall Risks No Fall Risks    Follow up Falls prevention discussed Falls prevention discussed Falls prevention discussed;Education provided;Falls evaluation completed        Functional Status Survey: Is the patient deaf or have difficulty hearing?: No Does the patient have difficulty seeing, even when wearing glasses/contacts?: No Does the patient have difficulty concentrating, remembering, or making decisions?: No Does the patient have difficulty walking or climbing stairs?: No Does the  patient have difficulty dressing or bathing?: No Does the patient have difficulty doing errands alone such as visiting a doctor's office or shopping?: No   Assessment & Plan  1. Dyslipidemia associated with type 2 diabetes mellitus (HCC)  - POCT HgB A1C  2. Coronary artery disease involving native coronary artery of native heart without angina pectoris  Crestor   3. Benign essential hypertension  BP is slightly up   4. Gastroesophageal reflux disease without esophagitis  stable  5. Obesity (BMI 30.0-34.9)  - Semaglutide-Weight Management (WEGOVY) 1 MG/0.5ML SOAJ; Inject 1 mg into the skin once a week.  Dispense: 6 mL; Refill: 0  6. OSA (obstructive sleep apnea)  Continue CPAP   7. GAD (generalized anxiety disorder)  - hydrOXYzine (ATARAX) 10 MG tablet; Take 1-2 tablets (10-20 mg total) by mouth at bedtime as needed.  Dispense: 60 tablet; Refill: 0  8. Insomnia, unspecified type  - hydrOXYzine (ATARAX) 10 MG tablet; Take 1-2 tablets (10-20 mg total)  by mouth at bedtime as needed.  Dispense: 60 tablet; Refill: 0

## 2023-01-15 ENCOUNTER — Ambulatory Visit (INDEPENDENT_AMBULATORY_CARE_PROVIDER_SITE_OTHER): Payer: Managed Care, Other (non HMO) | Admitting: Family Medicine

## 2023-01-15 ENCOUNTER — Encounter: Payer: Self-pay | Admitting: Family Medicine

## 2023-01-15 VITALS — BP 136/84 | HR 98 | Resp 16 | Ht 61.0 in | Wt 173.0 lb

## 2023-01-15 DIAGNOSIS — E1169 Type 2 diabetes mellitus with other specified complication: Secondary | ICD-10-CM | POA: Diagnosis not present

## 2023-01-15 DIAGNOSIS — I10 Essential (primary) hypertension: Secondary | ICD-10-CM

## 2023-01-15 DIAGNOSIS — K219 Gastro-esophageal reflux disease without esophagitis: Secondary | ICD-10-CM | POA: Diagnosis not present

## 2023-01-15 DIAGNOSIS — E785 Hyperlipidemia, unspecified: Secondary | ICD-10-CM

## 2023-01-15 DIAGNOSIS — I2510 Atherosclerotic heart disease of native coronary artery without angina pectoris: Secondary | ICD-10-CM | POA: Diagnosis not present

## 2023-01-15 DIAGNOSIS — G4733 Obstructive sleep apnea (adult) (pediatric): Secondary | ICD-10-CM

## 2023-01-15 DIAGNOSIS — G4700 Insomnia, unspecified: Secondary | ICD-10-CM

## 2023-01-15 DIAGNOSIS — F411 Generalized anxiety disorder: Secondary | ICD-10-CM

## 2023-01-15 DIAGNOSIS — E669 Obesity, unspecified: Secondary | ICD-10-CM

## 2023-01-15 LAB — POCT GLYCOSYLATED HEMOGLOBIN (HGB A1C): Hemoglobin A1C: 6.2 % — AB (ref 4.0–5.6)

## 2023-01-15 MED ORDER — ESCITALOPRAM OXALATE 10 MG PO TABS: 10.0000 mg | ORAL_TABLET | Freq: Every day | ORAL | 1 refills | Status: DC

## 2023-01-15 MED ORDER — WEGOVY 1 MG/0.5ML ~~LOC~~ SOAJ: 1.0000 mg | SUBCUTANEOUS | 0 refills | Status: DC

## 2023-01-15 MED ORDER — HYDROXYZINE HCL 10 MG PO TABS: 10.0000 mg | ORAL_TABLET | Freq: Every evening | ORAL | 0 refills | Status: DC | PRN

## 2023-01-20 ENCOUNTER — Other Ambulatory Visit: Payer: Self-pay

## 2023-01-20 ENCOUNTER — Encounter: Payer: Self-pay | Admitting: Emergency Medicine

## 2023-01-20 DIAGNOSIS — Z5321 Procedure and treatment not carried out due to patient leaving prior to being seen by health care provider: Secondary | ICD-10-CM | POA: Insufficient documentation

## 2023-01-20 DIAGNOSIS — R079 Chest pain, unspecified: Secondary | ICD-10-CM | POA: Diagnosis not present

## 2023-01-20 NOTE — ED Triage Notes (Addendum)
Pt arrived via POV with reports of CP that started around 10pm tonight, pt states she was laying down when the pain started, pt reports L side chest pain, radiating down L arm and up to L side of neck, pt also reports nausea. Pt ambulatory without difficulty.  Denies any SOB at this time.   Pt states she took a FULL aspirin prior to arrival.

## 2023-01-21 ENCOUNTER — Emergency Department: Payer: Managed Care, Other (non HMO)

## 2023-01-21 ENCOUNTER — Emergency Department
Admission: EM | Admit: 2023-01-21 | Discharge: 2023-01-21 | Discharge status: Against Medical Advice | Payer: Managed Care, Other (non HMO) | Attending: Emergency Medicine | Admitting: Emergency Medicine

## 2023-01-21 LAB — CBC
HCT: 41.4 % (ref 36.0–46.0)
Hemoglobin: 12.7 g/dL (ref 12.0–15.0)
MCH: 26.2 pg (ref 26.0–34.0)
MCHC: 30.7 g/dL (ref 30.0–36.0)
MCV: 85.5 fL (ref 80.0–100.0)
Platelets: 320 10*3/uL (ref 150–400)
RBC: 4.84 MIL/uL (ref 3.87–5.11)
RDW: 14.4 % (ref 11.5–15.5)
WBC: 6.6 10*3/uL (ref 4.0–10.5)
nRBC: 0 % (ref 0.0–0.2)

## 2023-01-21 LAB — BASIC METABOLIC PANEL
Anion gap: 8 (ref 5–15)
BUN: 20 mg/dL (ref 6–20)
CO2: 25 mmol/L (ref 22–32)
Calcium: 10.1 mg/dL (ref 8.9–10.3)
Chloride: 103 mmol/L (ref 98–111)
Creatinine, Ser: 0.81 mg/dL (ref 0.44–1.00)
GFR, Estimated: >60 mL/min (ref >60)
Glucose, Bld: 111 mg/dL — ABNORMAL HIGH (ref 70–99)
Potassium: 3.3 mmol/L — ABNORMAL LOW (ref 3.5–5.1)
Sodium: 136 mmol/L (ref 135–145)

## 2023-01-21 LAB — TROPONIN I (HIGH SENSITIVITY)
Troponin I (High Sensitivity): 4 ng/L (ref <18)
Troponin I (High Sensitivity): 5 ng/L (ref <18)

## 2023-01-21 NOTE — ED Notes (Signed)
No answer when called several times from lobby 

## 2023-01-21 NOTE — ED Provider Notes (Signed)
I went to evaluate and see the patient in approximately 5 AM, but patient was not able to be located in the lobby per nursing staff.  Patient is felt to have eloped.  I did not see or participate directly in the patient's care  EKG  EKG from 2348 interpreted by me heart rate 75 QRS 85 QTc 420 Normal sinus rhythm no evidence of acute ischemia or ectopy  Repeat EKG performed at 2349 reviewed and interpreted by me heart rate 75 QRS 85 QTc 420 Normal sinus rhythm No ischemia or ectopy   RADIOLOGY Chest x-ray interpreted by me as negative for acute pathology  DG Chest 2 View  Result Date: 01/21/2023 CLINICAL DATA:  Chest pain onset last night. Radiates down the left arm and up into the left neck. EXAM: CHEST - 2 VIEW COMPARISON:  PA Lat 04/25/2022 FINDINGS: There is mild cardiomegaly with stable cardiac size and mediastinal configuration. Scattered calcific plaque is seen in the transverse aorta. No vascular congestion is seen. The lungs are clear of focal infiltrates. No pleural effusion is evident. There is thoracic spondylosis. IMPRESSION: No evidence of acute chest disease. Mild cardiomegaly. Aortic atherosclerosis. Electronically Signed   By: Almira Bar M.D.   On: 01/21/2023 00:38      Sharyn Creamer, MD 01/21/23 [phone]

## 2023-02-11 ENCOUNTER — Other Ambulatory Visit: Payer: Self-pay | Admitting: Family Medicine

## 2023-02-11 DIAGNOSIS — G4700 Insomnia, unspecified: Secondary | ICD-10-CM

## 2023-02-11 DIAGNOSIS — F411 Generalized anxiety disorder: Secondary | ICD-10-CM

## 2023-03-02 ENCOUNTER — Telehealth: Payer: Self-pay | Admitting: Family Medicine

## 2023-03-02 ENCOUNTER — Other Ambulatory Visit: Payer: Self-pay

## 2023-03-02 DIAGNOSIS — R911 Solitary pulmonary nodule: Secondary | ICD-10-CM

## 2023-03-02 NOTE — Telephone Encounter (Signed)
New order placed and faxed per request

## 2023-03-02 NOTE — Telephone Encounter (Signed)
Copied from CRM [id number]. Topic: General - Other >> Mar 02, 2023  1:11 PM Dondra Prader A wrote: Reason for CRM: Nonnie Done with [employer] inform choice dept is requesting pt CT scan of the chest without contrast order to be faxed to a new imaging location. Nonnie Done would like for it to be sent to [facility], phone number: [phone], fax number: [phone]

## 2023-03-03 ENCOUNTER — Ambulatory Visit: Admission: RE | Admit: 2023-03-03 | Payer: Managed Care, Other (non HMO) | Source: Ambulatory Visit

## 2023-03-09 ENCOUNTER — Other Ambulatory Visit: Payer: Self-pay

## 2023-03-09 DIAGNOSIS — R911 Solitary pulmonary nodule: Secondary | ICD-10-CM

## 2023-03-11 ENCOUNTER — Other Ambulatory Visit: Payer: Self-pay | Admitting: Family Medicine

## 2023-03-11 DIAGNOSIS — G4700 Insomnia, unspecified: Secondary | ICD-10-CM

## 2023-03-11 DIAGNOSIS — F411 Generalized anxiety disorder: Secondary | ICD-10-CM

## 2023-03-12 ENCOUNTER — Encounter: Payer: Self-pay | Admitting: Family Medicine

## 2023-04-03 NOTE — Progress Notes (Deleted)
Name: [name]   MRN: [id number]    DOB: 23-Mar-1966   Date:04/03/2023       Progress Note  Subjective  Chief Complaint  Follow Up  HPI  DMII: she is already taking Wegovy for weight loss, she lost 9 lbs in the past 3 months, she has a long history of pre diabetes, but last A1C was 6.6 %. LDL is slightly above goal at 78, she is taking statins and changed diet plus losing weight therefore we will hold off on adjusting dose of medication. She also has HTN and is taking Tribenzor, bp still slightly above goal but we hope it will improve with weight loss.   S/p Right hip Replacement : she had right hip pain for months , finally got right hip replacement in March 2022 by Dr. Odis Luster however was still struggling with pain , she was on oxycodone but stopped going and is now only taking tylenol or aleve prn pain  Worse when sitting for a long time or walking more than usual   Obesity: BMI above 30 but right under 35. She was going to the weight loss clinic, she states phentermine worked well for her, but since she has been off medication bp is at goal. She states she stopped going to the weight loss clinic because of cost. She became obese in her early 20's and she has tried multiple diets. Such as Weight Watchers, cabbage diet, Atkins , she loses weight on her diet but gains it back. . She tried Ozempic but stopped on her own , she came in in June  2022 and we gave her Bernie Covey , she stopped medication again. She started on [facility] March 2024 - there was a gap but now is back on medication , she wants to go up to 1 mg dose today   HTN/CAD: bp is better today,  she stopped skipping medication, she did not take it this am because she likes to take it with food. Continue Tribenzor  and aspirin , plus statin therapy. Goal LDL is below 70 - rx given by cardiologist, we will adjust dose to 40 mg if still elevated next time we check   Lung nodule on CT cardiac calcium score: she used to smoke but quit last  year, history 20 plus pack, we will recheck in 6 months    Anxiety:  Symptoms started when her son went to prison around 2014 for selling drugs, he was  out of jail, but he was in a fight and got arrested and they bailed him out,  he fail his probation for positive drug test, he is back in prison, he was out but this summer he was in a big argument with his girlfriend - she had a panic attack and went to [facility]. Marland Kitchen She is on Lexapro, her son is still stressing her our, he has a food truck now but still lives with her - there is also drama -he also wakes up when he goes out to smoke   GERD: she  is back on PPI but only taking medication prn ,stable    OSA: also has LVH,  hypertension, interrupted sleep - She went to pulmonologist - Dr. Craige Cotta , has moderate OSA, she still does not use her CPAP, she has not been back to pulmonologist . She told me in the past that her insurance did not pay for supplies but she wants to try again   Hyperlipidemia: taking Rosuvastatin and denies side effects, she  will have labs done today   Systolic Ejection Murmur: had echo done 10/2022 mild mitral valve regurgitation   Insomnia: discussed sleep hygiene, at least try a timer on TV , she states felt groggy the following day, since she seems to be anxious we will switch to hydroxizine to take prn at night   Patient Active Problem List   Diagnosis Date Noted   Encounter for screening colonoscopy 10/23/2022   Adenomatous polyp of colon 10/23/2022   Coronary artery disease involving native coronary artery of native heart without angina pectoris 10/13/2022   Lung nodule 10/13/2022   Insomnia 10/13/2022   Pre-diabetes 10/13/2022   Obesity (BMI 30.0-34.9) 10/13/2022   History of right hip replacement 10/10/2020   OSA (obstructive sleep apnea) 09/08/2019   Ganglion cyst of dorsum of left wrist 02/05/2018   LVH (left ventricular hypertrophy) due to hypertensive disease, without heart failure 09/23/2017   SOBOE (shortness of  breath on exertion) 09/23/2017   Hyperlipidemia, mixed 08/28/2017   Hyperglycemia 08/25/2017   Hypercalcemia due to thiazide and vitamin A 08/25/2017   Benign essential hypertension 08/18/2017   GAD (generalized anxiety disorder) 08/18/2017   GERD (gastroesophageal reflux disease) 08/18/2017   Panic attack 08/18/2017    Past Surgical History:  Procedure Laterality Date   ABDOMINAL HYSTERECTOMY     Complete Hysterectomy   CHOLECYSTECTOMY     COLONOSCOPY WITH PROPOFOL N/A 10/23/2022   Procedure: COLONOSCOPY WITH PROPOFOL;  Surgeon: Wyline Mood, MD;  Location: [facility] ENDOSCOPY;  Service: Gastroenterology;  Laterality: N/A;   GANGLION CYST EXCISION Left 03/01/2018   Procedure: REMOVAL GANGLION OF WRIST;  Surgeon: Deeann Saint, MD;  Location: [facility] ORS;  Service: Orthopedics;  Laterality: Left;   JOINT REPLACEMENT     TOTAL HIP ARTHROPLASTY Right 10/10/2020   Procedure: TOTAL HIP ARTHROPLASTY ANTERIOR APPROACH;  Surgeon: Lyndle Herrlich, MD;  Location: [facility] ORS;  Service: Orthopedics;  Laterality: Right;    Family History  Problem Relation Age of Onset   Hypertension Mother    Stroke Father    Hypertension Father    Hypertension Sister    Hypertension Sister    Hypertension Brother    Breast cancer Neg Hx     Social History   Tobacco Use   Smoking status: Former    Current packs/day: 0.00    Average packs/day: 0.5 packs/day for 22.5 years (11.2 ttl pk-yrs)    Types: Cigarettes    Start date: 08/19/1999    Quit date: 01/30/2022    Years since quitting: 1.1   Smokeless tobacco: Never  Substance Use Topics   Alcohol use: Yes    Alcohol/week: 1.0 standard drink of alcohol    Types: 1 Glasses of wine per week    Comment: rare on Saturdays      Current Outpatient Medications:    aspirin EC 81 MG tablet, Take 1 tablet (81 mg total) by mouth daily. Swallow whole., Disp: 90 tablet, Rfl: 3   escitalopram (LEXAPRO) 10 MG tablet, Take 1 tablet (10 mg total) by mouth daily., Disp: 90  tablet, Rfl: 1   fluticasone (FLONASE) 50 MCG/ACT nasal spray, SHAKE LIQUID AND USE 2 SPRAYS IN EACH NOSTRIL DAILY AS NEEDED FOR ALLERGIES, Disp: 48 g, Rfl: 0   hydrOXYzine (ATARAX) 10 MG tablet, TAKE 1 TO 2 TABLETS BY MOUTH AT BEDTIME AS NEEDED, Disp: 60 tablet, Rfl: 0   Olmesartan-amLODIPine-HCTZ 40-5-25 MG TABS, Take 1 tablet by mouth every morning., Disp: 90 tablet, Rfl: 1   pantoprazole (PROTONIX) 40 MG  tablet, Take 1 tablet (40 mg total) by mouth daily., Disp: 90 tablet, Rfl: 1   rosuvastatin (CRESTOR) 20 MG tablet, Take 1 tablet (20 mg total) by mouth daily., Disp: 90 tablet, Rfl: 3   Semaglutide-Weight Management (WEGOVY) 1 MG/0.5ML SOAJ, Inject 1 mg into the skin once a week., Disp: 6 mL, Rfl: 0  Allergies  Allergen Reactions   Penicillins Itching and Swelling    Has patient had a PCN reaction causing immediate rash, facial/tongue/throat swelling, SOB or lightheadedness with hypotension: No Has patient had a PCN reaction causing severe rash involving mucus membranes or skin necrosis: No Has patient had a PCN reaction that required hospitalization: No Has patient had a PCN reaction occurring within the last 10 years: No If all of the above answers are "NO", then may proceed with Cephalosporin use.     I personally reviewed active problem list, medication list, allergies, family history, social history, health maintenance with the patient/caregiver today.   ROS  ***  Objective  There were no vitals filed for this visit.  There is no height or weight on file to calculate BMI.  Physical Exam ***   Diabetic Foot Exam: Diabetic Foot Exam - Simple   No data filed    ***  PHQ2/9:    01/15/2023    8:40 AM 10/13/2022   10:50 AM 07/11/2022    2:04 PM 02/07/2022    3:03 PM 12/27/2021    2:37 PM  Depression screen PHQ 2/9  Decreased Interest 0 0 0 0 0  Down, Depressed, Hopeless 0 0 0 0 0  PHQ - 2 Score 0 0 0 0 0  Altered sleeping 1 3 0 0 0  Tired, decreased energy 0 0 0  0 0  Change in appetite 0 0 0 0 0  Feeling bad or failure about yourself  0 0 0 0 0  Trouble concentrating 0 0 0 0 0  Moving slowly or fidgety/restless 0 0 0 0 0  Suicidal thoughts 0 0 0 0 0  PHQ-9 Score 1 3 0 0 0  Difficult doing work/chores    Not difficult at all Not difficult at all    phq 9 is {gen pos [id number]}   Fall Risk:    01/15/2023    8:40 AM 10/13/2022   10:50 AM 07/11/2022    2:03 PM 02/07/2022    3:02 PM 12/27/2021    2:35 PM  Fall Risk   Falls in the past year? 0 0 0 0 0  Number falls in past yr: 0 0  0 0  Injury with Fall? 0 0  0 0  Risk for fall due to : No Fall Risks No Fall Risks No Fall Risks    Follow up Falls prevention discussed Falls prevention discussed Falls prevention discussed;Education provided;Falls evaluation completed        Functional Status Survey:      Assessment & Plan  *** There are no diagnoses linked to this encounter.

## 2023-04-06 ENCOUNTER — Ambulatory Visit: Payer: Managed Care, Other (non HMO) | Admitting: Family Medicine

## 2023-04-06 DIAGNOSIS — E1169 Type 2 diabetes mellitus with other specified complication: Secondary | ICD-10-CM

## 2023-04-09 ENCOUNTER — Other Ambulatory Visit: Payer: Self-pay | Admitting: Family Medicine

## 2023-04-09 DIAGNOSIS — E669 Obesity, unspecified: Secondary | ICD-10-CM

## 2023-04-16 ENCOUNTER — Ambulatory Visit: Payer: Managed Care, Other (non HMO) | Admitting: Family Medicine

## 2023-05-01 NOTE — Progress Notes (Unsigned)
Name: [name]   MRN: [id number]    DOB: [birth year]   Date:05/04/2023       Progress Note  Subjective  Chief Complaint  Follow Up  HPI  DMII: she was  already taking Wegovy for weight loss, she has a long history of pre diabetes, but even on Wegovy A1C spiked to  6.6 % in March 2024 . She is doing well, A1C today is down to 5.9 %, her weight is down to 157 lbs  She also has HTN and is taking Tribenzor, bp still slightly above goal but we hope it will improve with continue weight loss.   S/p Right hip Replacement : she had right hip pain for months , finally got right hip replacement in March 2022 by Dr. Odis Luster pain is minimal now, only has some stiffness when she sits for a prolonged period of time   Obesity: BMI above 30 but right under 35. She was going to the weight loss clinic, she states phentermine worked well for her, but since she has been off medication bp finally got to goal.  She states she stopped going to the weight loss clinic because of cost. She became obese in her early 20's and she has tried multiple diets. Such as Weight Watchers, cabbage diet, Atkins , she loses weight on her diet but gains it back. . She tried Ozempic but stopped on her own , she came in in June  2022 and we gave her Bernie Covey , she stopped medication again. She started on [facility] March 2024 - weight was 182 lbs and today is down to 157 lbs.   HTN/CAD: bp is better today,  she stopped skipping medication, she did not take it this am because she likes to take it with food. Continue Tribenzor  and aspirin , plus statin therapy. Goal LDL is below 70 - rx given by cardiologist and we adjusted dose to 20 mg, tolerating it well and we will recheck all labs next time    Anxiety:  Symptoms started when her son went to prison around 2014 for selling drugs, he was  out of jail, but he was in a fight and got arrested and they bailed him out,  he fail his probation for positive drug test, he is back in prison, he was  out but this summer he was in a big argument with his girlfriend - she had a panic attack and went to [facility]. Marland Kitchen She is on Lexapro, her son is still stressing her out but doing better now. Daughter recently had an aneurysm rupture in her brain   GERD: she  is back on PPI but only taking medication prn ,stable   Incontinence: she has a hysterectomy and bladder tack years ago and over the past 7  months symptoms are getting worse, with stress and urge incontinence.    OSA: also has LVH,  hypertension, interrupted sleep - She went to pulmonologist - Dr. Craige Cotta , has moderate OSA, she still does not use her CPAP, she has not been back to pulmonologist, and due to weight loss she wants to hold off on going back for now   Hyperlipidemia: taking Rosuvastatin 20 mg  and denies side effects, she will have labs done next visit   Systolic Ejection Murmur: had echo done 10/2022 mild mitral valve regurgitation   Insomnia: discussed sleep hygiene, at least try a timer on TV , she states felt groggy the following day, since she seems to be anxious  we will switch to hydroxizine at bedtime and seems to be working well for her   Lung nodule on CT cardiac calcium score: she used to smoke but quit in 2023 , history 20 plus pack, she had repeat CT done last year and she would like to recheck in one year   IMPRESSION: 03/06/2023  2 mm nodule within the right upper lobe. Recommend correlation with prior chest imaging. According to Fleischner Society guidelines, if the patient has risk factors for lung cancer, consider a follow-up chest CT in one year to ensure stability.   Patient Active Problem List   Diagnosis Date Noted   Adenomatous polyp of colon 10/23/2022   Coronary artery disease involving native coronary artery of native heart without angina pectoris 10/13/2022   Lung nodule 10/13/2022   Insomnia 10/13/2022   History of right hip replacement 10/10/2020   OSA (obstructive sleep apnea) 09/08/2019   Ganglion cyst  of dorsum of left wrist 02/05/2018   LVH (left ventricular hypertrophy) due to hypertensive disease, without heart failure 09/23/2017   Hyperlipidemia, mixed 08/28/2017   Hypercalcemia due to thiazide and vitamin A 08/25/2017   Benign essential hypertension 08/18/2017   GAD (generalized anxiety disorder) 08/18/2017   GERD (gastroesophageal reflux disease) 08/18/2017   Panic attack 08/18/2017    Past Surgical History:  Procedure Laterality Date   ABDOMINAL HYSTERECTOMY     Complete Hysterectomy   CHOLECYSTECTOMY     COLONOSCOPY WITH PROPOFOL N/A 10/23/2022   Procedure: COLONOSCOPY WITH PROPOFOL;  Surgeon: Wyline Mood, MD;  Location: [facility] ENDOSCOPY;  Service: Gastroenterology;  Laterality: N/A;   GANGLION CYST EXCISION Left 03/01/2018   Procedure: REMOVAL GANGLION OF WRIST;  Surgeon: Deeann Saint, MD;  Location: [facility] ORS;  Service: Orthopedics;  Laterality: Left;   JOINT REPLACEMENT     TOTAL HIP ARTHROPLASTY Right 10/10/2020   Procedure: TOTAL HIP ARTHROPLASTY ANTERIOR APPROACH;  Surgeon: Lyndle Herrlich, MD;  Location: [facility] ORS;  Service: Orthopedics;  Laterality: Right;    Family History  Problem Relation Age of Onset   Hypertension Mother    Stroke Father    Hypertension Father    Hypertension Sister    Hypertension Sister    Hypertension Brother    Cerebral aneurysm Daughter    Breast cancer Neg Hx     Social History   Tobacco Use   Smoking status: Former    Current packs/day: 0.00    Average packs/day: 0.5 packs/day for 22.5 years (11.2 ttl pk-yrs)    Types: Cigarettes    Start date: 08/19/1999    Quit date: 01/30/2022    Years since quitting: 1.2   Smokeless tobacco: Never  Substance Use Topics   Alcohol use: Yes    Alcohol/week: 1.0 standard drink of alcohol    Types: 1 Glasses of wine per week    Comment: rare on Saturdays      Current Outpatient Medications:    aspirin EC 81 MG tablet, Take 1 tablet (81 mg total) by mouth daily. Swallow whole., Disp: 90  tablet, Rfl: 3   escitalopram (LEXAPRO) 10 MG tablet, Take 1 tablet (10 mg total) by mouth daily., Disp: 90 tablet, Rfl: 1   fluticasone (FLONASE) 50 MCG/ACT nasal spray, SHAKE LIQUID AND USE 2 SPRAYS IN EACH NOSTRIL DAILY AS NEEDED FOR ALLERGIES, Disp: 48 g, Rfl: 0   rosuvastatin (CRESTOR) 20 MG tablet, Take 1 tablet (20 mg total) by mouth daily., Disp: 90 tablet, Rfl: 3   hydrOXYzine (ATARAX) 10 MG tablet, Take  1-2 tablets (10-20 mg total) by mouth at bedtime., Disp: 90 tablet, Rfl: 1   Olmesartan-amLODIPine-HCTZ 40-5-25 MG TABS, Take 1 tablet by mouth every morning., Disp: 90 tablet, Rfl: 1   pantoprazole (PROTONIX) 40 MG tablet, Take 1 tablet (40 mg total) by mouth daily., Disp: 90 tablet, Rfl: 1   Semaglutide-Weight Management (WEGOVY) 1 MG/0.5ML SOAJ, Inject 1 mg into the skin once a week., Disp: 6 mL, Rfl: 1  Allergies  Allergen Reactions   Penicillins Itching and Swelling    Has patient had a PCN reaction causing immediate rash, facial/tongue/throat swelling, SOB or lightheadedness with hypotension: No Has patient had a PCN reaction causing severe rash involving mucus membranes or skin necrosis: No Has patient had a PCN reaction that required hospitalization: No Has patient had a PCN reaction occurring within the last 10 years: No If all of the above answers are "NO", then may proceed with Cephalosporin use.     I personally reviewed active problem list, medication list, allergies, family history, social history, health maintenance with the patient/caregiver today.   ROS  Ten systems reviewed and is negative except as mentioned in HPI    Objective  Vitals:   05/04/23 0832  BP: 130/82  Pulse: 91  Resp: 16  SpO2: 98%  Weight: 157 lb (71.2 kg)  Height: 5\' 1"  (1.549 m)    Body mass index is 29.66 kg/m.  Physical Exam  Constitutional: Patient appears well-developed and well-nourished.  No distress.  HEENT: head atraumatic, normocephalic, pupils equal and reactive to  light, neck supple Cardiovascular: Normal rate, regular rhythm and normal heart sounds.  No murmur heard. No BLE edema. Pulmonary/Chest: Effort normal and breath sounds normal. No respiratory distress. Abdominal: Soft.  There is no tenderness. Psychiatric: Patient has a normal mood and affect. behavior is normal. Judgment and thought content normal.   Recent Results (from the past 2160 hour(s))  POCT HgB A1C     Status: Abnormal   Collection Time: 05/04/23  8:35 AM  Result Value Ref Range   Hemoglobin A1C 5.9 (A) 4.0 - 5.6 %   HbA1c POC (<> result, manual entry)     HbA1c, POC (prediabetic range)     HbA1c, POC (controlled diabetic range)      Diabetic Foot Exam: Diabetic Foot Exam - Simple   Simple Foot Form Visual Inspection No deformities, no ulcerations, no other skin breakdown bilaterally: Yes Sensation Testing Intact to touch and monofilament testing bilaterally: Yes Pulse Check Posterior Tibialis and Dorsalis pulse intact bilaterally: Yes Comments      PHQ2/9:    05/04/2023    8:33 AM 01/15/2023    8:40 AM 10/13/2022   10:50 AM 07/11/2022    2:04 PM 02/07/2022    3:03 PM  Depression screen PHQ 2/9  Decreased Interest 1 0 0 0 0  Down, Depressed, Hopeless 0 0 0 0 0  PHQ - 2 Score 1 0 0 0 0  Altered sleeping 0 1 3 0 0  Tired, decreased energy 0 0 0 0 0  Change in appetite 0 0 0 0 0  Feeling bad or failure about yourself  0 0 0 0 0  Trouble concentrating 0 0 0 0 0  Moving slowly or fidgety/restless 0 0 0 0 0  Suicidal thoughts 0 0 0 0 0  PHQ-9 Score 1 1 3  0 0  Difficult doing work/chores     Not difficult at all    phq 9 is negative   Fall Risk:  05/04/2023    8:32 AM 01/15/2023    8:40 AM 10/13/2022   10:50 AM 07/11/2022    2:03 PM 02/07/2022    3:02 PM  Fall Risk   Falls in the past year? 0 0 0 0 0  Number falls in past yr: 0 0 0  0  Injury with Fall? 0 0 0  0  Risk for fall due to : No Fall Risks No Fall Risks No Fall Risks No Fall Risks   Follow up  Falls prevention discussed Falls prevention discussed Falls prevention discussed Falls prevention discussed;Education provided;Falls evaluation completed       Functional Status Survey: Is the patient deaf or have difficulty hearing?: No Does the patient have difficulty seeing, even when wearing glasses/contacts?: Yes Does the patient have difficulty concentrating, remembering, or making decisions?: No Does the patient have difficulty walking or climbing stairs?: No Does the patient have difficulty dressing or bathing?: No Does the patient have difficulty doing errands alone such as visiting a doctor's office or shopping?: No    Assessment & Plan  1. Dyslipidemia associated with type 2 diabetes mellitus (HCC)  - POCT HgB A1C  2. Need for immunization against influenza  - Flu vaccine trivalent PF, 6mos and older(Flulaval,Afluria,Fluarix,Fluzone)  3. Obesity (BMI 30.0-34.9)  - Semaglutide-Weight Management (WEGOVY) 1 MG/0.5ML SOAJ; Inject 1 mg into the skin once a week.  Dispense: 6 mL; Refill: 1  4. OSA (obstructive sleep apnea)  Not on CPAP   5. Benign essential hypertension  - Olmesartan-amLODIPine-HCTZ 40-5-25 MG TABS; Take 1 tablet by mouth every morning.  Dispense: 90 tablet; Refill: 1  6. Lung nodule seen on imaging study  Repeat next year  7. GAD (generalized anxiety disorder)  - hydrOXYzine (ATARAX) 10 MG tablet; Take 1-2 tablets (10-20 mg total) by mouth at bedtime.  Dispense: 90 tablet; Refill: 1  8. Gastroesophageal reflux disease without esophagitis  - pantoprazole (PROTONIX) 40 MG tablet; Take 1 tablet (40 mg total) by mouth daily.  Dispense: 90 tablet; Refill: 1  9. Coronary artery disease involving native coronary artery of native heart without angina pectoris  On statin   10. Insomnia, unspecified type  - hydrOXYzine (ATARAX) 10 MG tablet; Take 1-2 tablets (10-20 mg total) by mouth at bedtime.  Dispense: 90 tablet; Refill: 1  11. Pure  hypercholesterolemia  On crestor   12. Mixed stress and urge urinary incontinence  - Ambulatory referral to Urology

## 2023-05-04 ENCOUNTER — Encounter: Payer: Self-pay | Admitting: Family Medicine

## 2023-05-04 ENCOUNTER — Ambulatory Visit (INDEPENDENT_AMBULATORY_CARE_PROVIDER_SITE_OTHER): Payer: Managed Care, Other (non HMO) | Admitting: Family Medicine

## 2023-05-04 VITALS — BP 130/82 | HR 91 | Resp 16 | Ht 61.0 in | Wt 157.0 lb

## 2023-05-04 DIAGNOSIS — K219 Gastro-esophageal reflux disease without esophagitis: Secondary | ICD-10-CM

## 2023-05-04 DIAGNOSIS — E785 Hyperlipidemia, unspecified: Secondary | ICD-10-CM | POA: Diagnosis not present

## 2023-05-04 DIAGNOSIS — G4733 Obstructive sleep apnea (adult) (pediatric): Secondary | ICD-10-CM

## 2023-05-04 DIAGNOSIS — Z23 Encounter for immunization: Secondary | ICD-10-CM

## 2023-05-04 DIAGNOSIS — E1169 Type 2 diabetes mellitus with other specified complication: Secondary | ICD-10-CM

## 2023-05-04 DIAGNOSIS — E7800 Pure hypercholesterolemia, unspecified: Secondary | ICD-10-CM

## 2023-05-04 DIAGNOSIS — F411 Generalized anxiety disorder: Secondary | ICD-10-CM

## 2023-05-04 DIAGNOSIS — R911 Solitary pulmonary nodule: Secondary | ICD-10-CM

## 2023-05-04 DIAGNOSIS — I10 Essential (primary) hypertension: Secondary | ICD-10-CM

## 2023-05-04 DIAGNOSIS — N3946 Mixed incontinence: Secondary | ICD-10-CM

## 2023-05-04 DIAGNOSIS — E66811 Obesity, class 1: Secondary | ICD-10-CM

## 2023-05-04 DIAGNOSIS — I2510 Atherosclerotic heart disease of native coronary artery without angina pectoris: Secondary | ICD-10-CM

## 2023-05-04 DIAGNOSIS — G4700 Insomnia, unspecified: Secondary | ICD-10-CM

## 2023-05-04 LAB — POCT GLYCOSYLATED HEMOGLOBIN (HGB A1C): Hemoglobin A1C: 5.9 % — AB (ref 4.0–5.6)

## 2023-05-04 MED ORDER — PANTOPRAZOLE SODIUM 40 MG PO TBEC: 40.0000 mg | DELAYED_RELEASE_TABLET | Freq: Every day | ORAL | 1 refills | Status: DC

## 2023-05-04 MED ORDER — WEGOVY 1 MG/0.5ML ~~LOC~~ SOAJ: 1.0000 mg | SUBCUTANEOUS | 0 refills | Status: DC

## 2023-05-04 MED ORDER — WEGOVY 1 MG/0.5ML ~~LOC~~ SOAJ
1.0000 mg | SUBCUTANEOUS | 1 refills | Status: DC
Start: 2023-05-04 — End: 2023-08-24

## 2023-05-04 MED ORDER — OLMESARTAN-AMLODIPINE-HCTZ 40-5-25 MG PO TABS: 1.0000 | ORAL_TABLET | ORAL | 1 refills | Status: DC

## 2023-05-04 MED ORDER — HYDROXYZINE HCL 10 MG PO TABS
10.0000 mg | ORAL_TABLET | Freq: Every day | ORAL | 1 refills | Status: DC
Start: 2023-05-04 — End: 2023-11-27

## 2023-05-22 ENCOUNTER — Ambulatory Visit: Payer: Managed Care, Other (non HMO) | Admitting: Family Medicine

## 2023-06-02 ENCOUNTER — Ambulatory Visit: Payer: Managed Care, Other (non HMO) | Admitting: Family Medicine

## 2023-07-06 ENCOUNTER — Encounter: Payer: Self-pay | Admitting: Urology

## 2023-07-06 ENCOUNTER — Ambulatory Visit: Payer: Managed Care, Other (non HMO) | Admitting: Urology

## 2023-07-06 VITALS — BP 154/88 | HR 81 | Ht 61.0 in | Wt 157.0 lb

## 2023-07-06 DIAGNOSIS — N3946 Mixed incontinence: Secondary | ICD-10-CM | POA: Diagnosis not present

## 2023-07-06 LAB — URINALYSIS, COMPLETE
Bilirubin, UA: NEGATIVE
Glucose, UA: NEGATIVE
Ketones, UA: NEGATIVE
Leukocytes,UA: NEGATIVE
Nitrite, UA: NEGATIVE
Specific Gravity, UA: >1.03 — ABNORMAL HIGH (ref 1.005–1.030)
Urobilinogen, Ur: 1 mg/dL (ref 0.2–1.0)
pH, UA: 6 (ref 5.0–7.5)

## 2023-07-06 LAB — MICROSCOPIC EXAMINATION

## 2023-07-06 MED ORDER — GEMTESA 75 MG PO TABS: 1.0000 | ORAL_TABLET | Freq: Every day | ORAL | Status: DC

## 2023-07-06 NOTE — Progress Notes (Signed)
07/06/2023 2:43 PM   [name] 21-Dec-1965 [id number]  Referring provider: Alba Cory, MD [address] [place],  [place] [address]  Chief Complaint  Patient presents with   New Patient (Initial Visit)   Urinary Incontinence    HPI: I was consulted to assess the patient is urinary incontinence.  She primarily has urge incontinence.  She can leak with coughing sneezing.  No bedwetting but she has foot on the floor syndrome.  She does not wear a pad  She voids every 2 hours and gets up once at night  Flow was good.  She had a bladder suspension years ago  She has had a hysterectomy.  Is not take daily aspirin or blood thinners and quit smoking 1 year ago  She has diabetes.  No stroke or back surgery.  No infections.  No kidney stones.  No treatment   PMH: Past Medical History:  Diagnosis Date   Acid reflux    Arthritis    GAD (generalized anxiety disorder)    Hypertension    OSA (obstructive sleep apnea) 09/08/2019   does not have a cpap   Tobacco use     Surgical History: Past Surgical History:  Procedure Laterality Date   ABDOMINAL HYSTERECTOMY     Complete Hysterectomy   CHOLECYSTECTOMY     COLONOSCOPY WITH PROPOFOL N/A 10/23/2022   Procedure: COLONOSCOPY WITH PROPOFOL;  Surgeon: Wyline Mood, MD;  Location: [facility] ENDOSCOPY;  Service: Gastroenterology;  Laterality: N/A;   GANGLION CYST EXCISION Left 03/01/2018   Procedure: REMOVAL GANGLION OF WRIST;  Surgeon: Deeann Saint, MD;  Location: [facility] ORS;  Service: Orthopedics;  Laterality: Left;   JOINT REPLACEMENT     TOTAL HIP ARTHROPLASTY Right 10/10/2020   Procedure: TOTAL HIP ARTHROPLASTY ANTERIOR APPROACH;  Surgeon: Lyndle Herrlich, MD;  Location: [facility] ORS;  Service: Orthopedics;  Laterality: Right;    Home Medications:  Allergies as of 07/06/2023       Reactions   Penicillins Itching, Swelling   Has patient had a PCN reaction causing immediate rash, facial/tongue/throat swelling, SOB or  lightheadedness with hypotension: No Has patient had a PCN reaction causing severe rash involving mucus membranes or skin necrosis: No Has patient had a PCN reaction that required hospitalization: No Has patient had a PCN reaction occurring within the last 10 years: No If all of the above answers are "NO", then may proceed with Cephalosporin use.        Medication List        Accurate as of July 06, 2023  2:43 PM. If you have any questions, ask your nurse or doctor.          aspirin EC 81 MG tablet Take 1 tablet (81 mg total) by mouth daily. Swallow whole.   escitalopram 10 MG tablet Commonly known as: Lexapro Take 1 tablet (10 mg total) by mouth daily.   fluticasone 50 MCG/ACT nasal spray Commonly known as: FLONASE SHAKE LIQUID AND USE 2 SPRAYS IN EACH NOSTRIL DAILY AS NEEDED FOR ALLERGIES   hydrOXYzine 10 MG tablet Commonly known as: ATARAX Take 1-2 tablets (10-20 mg total) by mouth at bedtime.   Olmesartan-amLODIPine-HCTZ 40-5-25 MG Tabs Take 1 tablet by mouth every morning.   pantoprazole 40 MG tablet Commonly known as: PROTONIX Take 1 tablet (40 mg total) by mouth daily.   rosuvastatin 20 MG tablet Commonly known as: CRESTOR Take 1 tablet (20 mg total) by mouth daily.   Wegovy 1 MG/0.5ML Soaj Generic drug:  Semaglutide-Weight Management Inject 1 mg into the skin once a week.        Allergies:  Allergies  Allergen Reactions   Penicillins Itching and Swelling    Has patient had a PCN reaction causing immediate rash, facial/tongue/throat swelling, SOB or lightheadedness with hypotension: No Has patient had a PCN reaction causing severe rash involving mucus membranes or skin necrosis: No Has patient had a PCN reaction that required hospitalization: No Has patient had a PCN reaction occurring within the last 10 years: No If all of the above answers are "NO", then may proceed with Cephalosporin use.     Family History: Family History  Problem  Relation Age of Onset   Hypertension Mother    Stroke Father    Hypertension Father    Hypertension Sister    Hypertension Sister    Hypertension Brother    Cerebral aneurysm Daughter    Breast cancer Neg Hx     Social History:  reports that she quit smoking about 17 months ago. Her smoking use included cigarettes. She started smoking about 23 years ago. She has a 11.2 pack-year smoking history. She has never been exposed to tobacco smoke. She has never used smokeless tobacco. She reports current alcohol use of about 1.0 standard drink of alcohol per week. She reports that she does not use drugs.  ROS:                                        Physical Exam: BP (!) 154/88   Pulse 81   Ht 5\' 1"  (1.549 m)   Wt 71.2 kg   BMI 29.66 kg/m   Constitutional:  Alert and oriented, No acute distress. HEENT: [place] AT, moist mucus membranes.  Trachea midline, no masses. Cardiovascular: No clubbing, cyanosis, or edema. Respiratory: Normal respiratory effort, no increased work of breathing. GI: Abdomen is soft, nontender, nondistended, no abdominal masses GU: Well supported bladder neck.  No prolapse or stress incontinence.  I did not see or feel any mesh extrusion but her tissues were minimally tender Skin: No rashes, bruises or suspicious lesions. Lymph: No cervical or inguinal adenopathy. Neurologic: Grossly intact, no focal deficits, moving all 4 extremities. Psychiatric: Normal mood and affect.  Laboratory Data: Lab Results  Component Value Date   WBC 6.6 01/21/2023   HGB 12.7 01/21/2023   HCT 41.4 01/21/2023   MCV 85.5 01/21/2023   PLT 320 01/21/2023    Lab Results  Component Value Date   CREATININE 0.81 01/21/2023    No results found for: "PSA"  No results found for: "TESTOSTERONE"  Lab Results  Component Value Date   HGBA1C 5.9 (A) 05/04/2023    Urinalysis    Component Value Date/Time   COLORURINE YELLOW (A) 10/15/2020 1448   APPEARANCEUR  CLOUDY (A) 10/15/2020 1448   LABSPEC 1.023 10/15/2020 1448   PHURINE 7.0 10/15/2020 1448   GLUCOSEU NEGATIVE 10/15/2020 1448   HGBUR NEGATIVE 10/15/2020 1448   BILIRUBINUR NEGATIVE 10/15/2020 1448   KETONESUR NEGATIVE 10/15/2020 1448   PROTEINUR NEGATIVE 10/15/2020 1448   NITRITE NEGATIVE 10/15/2020 1448   LEUKOCYTESUR NEGATIVE 10/15/2020 1448    Pertinent Imaging: Urine reviewed and sent for culture.  Chart reviewed.  Blood in urine  Assessment & Plan: Patient has mixed incontinence but primarily urge incontinence.  CT scan ordered for microscopic hematuria return on Gemtesa samples and prescription for the pelvic examination and  cystoscopy.  She may or may not need urodynamics in the future.  Call if CT scan abnormal.  I mention urodynamics without detail  1. Mixed incontinence urge and stress  - Urinalysis, Complete   No follow-ups on file.  Martina Sinner, MD  [facility] [address], [address] [place], [place] [address] [phone]

## 2023-07-06 NOTE — Patient Instructions (Signed)

## 2023-07-06 NOTE — Addendum Note (Signed)
Addended by: Sueanne Margarita on: 07/06/2023 03:09 PM   Modules accepted: Orders

## 2023-07-08 LAB — CULTURE, URINE COMPREHENSIVE

## 2023-07-31 ENCOUNTER — Encounter: Payer: Self-pay | Admitting: Family Medicine

## 2023-07-31 ENCOUNTER — Ambulatory Visit (INDEPENDENT_AMBULATORY_CARE_PROVIDER_SITE_OTHER): Payer: Managed Care, Other (non HMO) | Admitting: Family Medicine

## 2023-07-31 VITALS — BP 110/74 | HR 84 | Temp 97.9°F | Resp 16 | Ht 61.0 in | Wt 159.2 lb

## 2023-07-31 DIAGNOSIS — R202 Paresthesia of skin: Secondary | ICD-10-CM | POA: Diagnosis not present

## 2023-07-31 DIAGNOSIS — M79652 Pain in left thigh: Secondary | ICD-10-CM

## 2023-07-31 DIAGNOSIS — M545 Low back pain, unspecified: Secondary | ICD-10-CM

## 2023-07-31 MED ORDER — PREGABALIN 50 MG PO CAPS
50.0000 mg | ORAL_CAPSULE | Freq: Three times a day (TID) | ORAL | 0 refills | Status: DC
Start: 2023-07-31 — End: 2023-08-03

## 2023-07-31 NOTE — Progress Notes (Signed)
 Name: [name]   MRN: [id number]    DOB: 1965/11/17   Date:07/31/2023       Progress Note  Subjective  Chief Complaint  Chief Complaint  Patient presents with   Leg Pain    Left leg, has been by ER Sunday was told it was pinch nerve. Was prescribed gabapentin  and naproxen but no help    HPI  Discussed the use of AI scribe software for clinical note transcription with the patient, who gave verbal consent to proceed.  History of Present Illness   The patient, with a history of right hip replacement, presented with left lateral thigh pain extending to the knee, which started approximately a month ago. The pain was severe enough to cause a limp and interfere with her work at [employer]. The pain was described as constant and intense, with a tingling sensation. The patient also reported a mild, intermittent lower back pain.  The pain was so severe that the patient resorted to taking an Oxycodone  pill given by her daughter, which provided temporary relief. She also tried Naproxen and heat/cold pads, but these did not alleviate the pain. The patient reported that massaging the area provided some relief.  The patient was concerned that the pain might be related to her previous hip replacement and had scheduled an appointment with an orthopedic specialist. She also reported a sensation of itching in the area, which was both painful and pleasurable when scratched. The patient denied any groin pain.  The patient's pain was exacerbated by walking and was particularly intense when putting her foot down. Despite the pain, the patient reported no bowel or bladder incontinence.         Patient Active Problem List   Diagnosis Date Noted   Adenomatous polyp of colon 10/23/2022   Coronary artery disease involving native coronary artery of native heart without angina pectoris 10/13/2022   Lung nodule 10/13/2022   Insomnia 10/13/2022   History of right hip replacement 10/10/2020   OSA (obstructive  sleep apnea) 09/08/2019   Ganglion cyst of dorsum of left wrist 02/05/2018   LVH (left ventricular hypertrophy) due to hypertensive disease, without heart failure 09/23/2017   Hyperlipidemia, mixed 08/28/2017   Hypercalcemia due to thiazide and vitamin A 08/25/2017   Benign essential hypertension 08/18/2017   GAD (generalized anxiety disorder) 08/18/2017   GERD (gastroesophageal reflux disease) 08/18/2017   Panic attack 08/18/2017    Past Surgical History:  Procedure Laterality Date   ABDOMINAL HYSTERECTOMY     Complete Hysterectomy   CHOLECYSTECTOMY     COLONOSCOPY WITH PROPOFOL  N/A 10/23/2022   Procedure: COLONOSCOPY WITH PROPOFOL ;  Surgeon: Therisa Bi, MD;  Location: [facility] ENDOSCOPY;  Service: Gastroenterology;  Laterality: N/A;   GANGLION CYST EXCISION Left 03/01/2018   Procedure: REMOVAL GANGLION OF WRIST;  Surgeon: Cleotilde Barrio, MD;  Location: [facility] ORS;  Service: Orthopedics;  Laterality: Left;   JOINT REPLACEMENT     TOTAL HIP ARTHROPLASTY Right 10/10/2020   Procedure: TOTAL HIP ARTHROPLASTY ANTERIOR APPROACH;  Surgeon: Leora Lynwood SAUNDERS, MD;  Location: [facility] ORS;  Service: Orthopedics;  Laterality: Right;    Family History  Problem Relation Age of Onset   Hypertension Mother    Stroke Father    Hypertension Father    Hypertension Sister    Hypertension Sister    Hypertension Brother    Cerebral aneurysm Daughter    Breast cancer Neg Hx     Social History   Tobacco Use   Smoking status: Former  Current packs/day: 0.00    Average packs/day: 0.5 packs/day for 22.5 years (11.2 ttl pk-yrs)    Types: Cigarettes    Start date: 08/19/1999    Quit date: 01/30/2022    Years since quitting: 1.4    Passive exposure: Never   Smokeless tobacco: Never  Substance Use Topics   Alcohol use: Yes    Alcohol/week: 1.0 standard drink of alcohol    Types: 1 Glasses of wine per week    Comment: rare on Saturdays      Current Outpatient Medications:    aspirin  EC 81 MG tablet,  Take 1 tablet (81 mg total) by mouth daily. Swallow whole., Disp: 90 tablet, Rfl: 3   escitalopram  (LEXAPRO ) 10 MG tablet, Take 1 tablet (10 mg total) by mouth daily., Disp: 90 tablet, Rfl: 1   fluticasone  (FLONASE ) 50 MCG/ACT nasal spray, SHAKE LIQUID AND USE 2 SPRAYS IN EACH NOSTRIL DAILY AS NEEDED FOR ALLERGIES, Disp: 48 g, Rfl: 0   hydrOXYzine  (ATARAX ) 10 MG tablet, Take 1-2 tablets (10-20 mg total) by mouth at bedtime., Disp: 90 tablet, Rfl: 1   Olmesartan -amLODIPine -HCTZ 40-5-25 MG TABS, Take 1 tablet by mouth every morning., Disp: 90 tablet, Rfl: 1   pantoprazole  (PROTONIX ) 40 MG tablet, Take 1 tablet (40 mg total) by mouth daily., Disp: 90 tablet, Rfl: 1   rosuvastatin  (CRESTOR ) 20 MG tablet, Take 1 tablet (20 mg total) by mouth daily., Disp: 90 tablet, Rfl: 3   Semaglutide -Weight Management (WEGOVY ) 1 MG/0.5ML SOAJ, Inject 1 mg into the skin once a week., Disp: 6 mL, Rfl: 1   Vibegron  (GEMTESA ) 75 MG TABS, Take 1 tablet (75 mg total) by mouth daily., Disp: , Rfl:   Allergies  Allergen Reactions   Penicillins Itching and Swelling    Has patient had a PCN reaction causing immediate rash, facial/tongue/throat swelling, SOB or lightheadedness with hypotension: No Has patient had a PCN reaction causing severe rash involving mucus membranes or skin necrosis: No Has patient had a PCN reaction that required hospitalization: No Has patient had a PCN reaction occurring within the last 10 years: No If all of the above answers are NO, then may proceed with Cephalosporin use.     I personally reviewed active problem list, medication list, allergies, family history with the patient/caregiver today.   ROS  Ten systems reviewed and is negative except as mentioned in HPI    Objective  Vitals:   07/31/23 0809  BP: 110/74  Pulse: 84  Resp: 16  Temp: 97.9 F (36.6 C)  TempSrc: Oral  SpO2: 99%  Weight: 159 lb 3.2 oz (72.2 kg)  Height: 5' 1 (1.549 m)    Body mass index is 30.08  kg/m.  Physical Exam  Constitutional: Patient appears well-developed and well-nourished. Obese  No distress.  HEENT: head atraumatic, normocephalic, pupils equal and reactive to light, neck supple Cardiovascular: Normal rate, regular rhythm and normal heart sounds.  No murmur heard. No BLE edema. Pulmonary/Chest: Effort normal and breath sounds normal. No respiratory distress. Abdominal: Soft.  There is no tenderness. Muscular Skeletal: normal back and hip exam  Psychiatric: Patient has a normal mood and affect. behavior is normal. Judgment and thought content normal.   Recent Results (from the past 2160 hours)  POCT HgB A1C     Status: Abnormal   Collection Time: 05/04/23  8:35 AM  Result Value Ref Range   Hemoglobin A1C 5.9 (A) 4.0 - 5.6 %   HbA1c POC (<> result, manual entry)  HbA1c, POC (prediabetic range)     HbA1c, POC (controlled diabetic range)    Urinalysis, Complete     Status: Abnormal   Collection Time: 07/06/23  2:02 PM  Result Value Ref Range   Specific Gravity, UA >1.030 (H) 1.005 - 1.030   pH, UA 6.0 5.0 - 7.5   Color, UA Yellow Yellow   Appearance Ur Clear Clear   Leukocytes,UA Negative Negative   Protein,UA Trace (A) Negative/Trace   Glucose, UA Negative Negative   Ketones, UA Negative Negative   RBC, UA Trace (A) Negative   Bilirubin, UA Negative Negative   Urobilinogen, Ur 1.0 0.2 - 1.0 mg/dL   Nitrite, UA Negative Negative   Microscopic Examination See below:   Microscopic Examination     Status: Abnormal   Collection Time: 07/06/23  2:02 PM   Urine  Result Value Ref Range   WBC, UA 0-5 0 - 5 /hpf   RBC, Urine 3-10 (A) 0 - 2 /hpf   Epithelial Cells (non renal) 0-10 0 - 10 /hpf   Mucus, UA Present (A) Not Estab.   Bacteria, UA Few None seen/Few  CULTURE, URINE COMPREHENSIVE     Status: None   Collection Time: 07/06/23  4:12 PM   Specimen: Urine   UR  Result Value Ref Range   Urine Culture, Comprehensive Final report    Organism ID, Bacteria  Comment     Comment: Mixed urogenital flora 8,000  Colonies/mL     Diabetic Foot Exam:     PHQ2/9:    07/31/2023    8:12 AM 05/04/2023    8:33 AM 01/15/2023    8:40 AM 10/13/2022   10:50 AM 07/11/2022    2:04 PM  Depression screen PHQ 2/9  Decreased Interest 0 1 0 0 0  Down, Depressed, Hopeless 0 0 0 0 0  PHQ - 2 Score 0 1 0 0 0  Altered sleeping 0 0 1 3 0  Tired, decreased energy 0 0 0 0 0  Change in appetite 0 0 0 0 0  Feeling bad or failure about yourself  0 0 0 0 0  Trouble concentrating 0 0 0 0 0  Moving slowly or fidgety/restless 0 0 0 0 0  Suicidal thoughts 0 0 0 0 0  PHQ-9 Score 0 1 1 3  0  Difficult doing work/chores Not difficult at all        phq 9 is negative   Fall Risk:    07/31/2023    8:07 AM 05/04/2023    8:32 AM 01/15/2023    8:40 AM 10/13/2022   10:50 AM 07/11/2022    2:03 PM  Fall Risk   Falls in the past year? 0 0 0 0 0  Number falls in past yr: 0 0 0 0   Injury with Fall? 0 0 0 0   Risk for fall due to : No Fall Risks No Fall Risks No Fall Risks No Fall Risks No Fall Risks  Follow up Falls prevention discussed;Education provided;Falls evaluation completed Falls prevention discussed Falls prevention discussed Falls prevention discussed Falls prevention discussed;Education provided;Falls evaluation completed    Assessment & Plan    Left Lateral Thigh Pain Pain has been present for about a month, with increased intensity leading to ER visit. Pain is localized to the lateral thigh, with tingling sensation and relief with massage. No radiation of pain. Mild intermittent lower back pain. History of right hip replacement. Possible diagnosis of Meralgia Paresthetica. -Start Lyrica  50mg   at night, with gradual increase as tolerated to manage nerve pain. -Optional X-rays of the back and hip to rule out other causes of pain, patient opted to hold off and try medication first. -Follow-up appointment in April 2025.      Notes from [facility] reviewed

## 2023-08-03 ENCOUNTER — Other Ambulatory Visit: Payer: Self-pay | Admitting: Family Medicine

## 2023-08-03 DIAGNOSIS — R202 Paresthesia of skin: Secondary | ICD-10-CM

## 2023-08-03 DIAGNOSIS — M545 Low back pain, unspecified: Secondary | ICD-10-CM

## 2023-08-03 MED ORDER — PREGABALIN 50 MG PO CAPS
50.0000 mg | ORAL_CAPSULE | Freq: Three times a day (TID) | ORAL | 0 refills | Status: DC
Start: 2023-08-03 — End: 2023-11-27

## 2023-08-24 ENCOUNTER — Telehealth: Payer: Self-pay | Admitting: Family Medicine

## 2023-08-24 ENCOUNTER — Other Ambulatory Visit: Payer: Self-pay | Admitting: Family Medicine

## 2023-08-24 ENCOUNTER — Other Ambulatory Visit: Payer: Self-pay

## 2023-08-24 DIAGNOSIS — E66811 Obesity, class 1: Secondary | ICD-10-CM

## 2023-08-24 MED ORDER — WEGOVY 1 MG/0.5ML ~~LOC~~ SOAJ: 1.0000 mg | SUBCUTANEOUS | 0 refills | Status: DC

## 2023-08-24 NOTE — Telephone Encounter (Signed)
Encounter sent to PCP for refill

## 2023-08-24 NOTE — Telephone Encounter (Signed)
Copied from CRM [id number]. Topic: Appointment Scheduling - Scheduling Inquiry for Clinic >> Aug 24, 2023  9:50 AM Franchot Heidelberg wrote: Reason for CRM: Pt called requesting to have Xray imaging this Thursday morning, says she discussed this with her PCP if her left leg did not improve after advice from PCP.

## 2023-08-24 NOTE — Telephone Encounter (Signed)
Copied from CRM [id number]. Topic: General - Other >> Aug 24, 2023  9:52 AM Franchot Heidelberg wrote: Reason for CRM: Pt is requesting a refill of her wegovy injection  [address] [facility] [address] ([address]), [place] - [address] [place] ([place]) [place] [address] Phone: [phone]  Fax: [phone]

## 2023-08-24 NOTE — Telephone Encounter (Signed)
Pt informed imaging was already ordered to please call [facility] [phone].

## 2023-08-24 NOTE — Telephone Encounter (Signed)
Medication Refill -  Most Recent Primary Care Visit:  Provider: Alba Cory  Department: [facility]  Visit Type: OFFICE VISIT  Date: 07/31/2023  Medication: Wegovy   Has the patient contacted their pharmacy? Yes (Agent: If no, request that the patient contact the pharmacy for the refill. If patient does not wish to contact the pharmacy document the reason why and proceed with request.) (Agent: If yes, when and what did the pharmacy advise?)  Is this the correct pharmacy for this prescription? Yes If no, delete pharmacy and type the correct one.   [facility] [address] ([address]), [place] - [address] [place]) [place] [address] Phone: [phone]  Fax: [phone]    Has the prescription been filled recently? Yes  Is the patient out of the medication? Yes  Has the patient been seen for an appointment in the last year OR does the patient have an upcoming appointment? Yes  Can we respond through MyChart? Yes  Agent: Please be advised that Rx refills may take up to 3 business days. We ask that you follow-up with your pharmacy.

## 2023-08-25 ENCOUNTER — Other Ambulatory Visit: Payer: Self-pay | Admitting: Family Medicine

## 2023-08-25 DIAGNOSIS — E66811 Obesity, class 1: Secondary | ICD-10-CM

## 2023-08-26 NOTE — Telephone Encounter (Signed)
Refused Wegovy refill request.   Already been sent on 08/24/2023 6 ml, 0 refills

## 2023-08-27 ENCOUNTER — Ambulatory Visit
Admission: RE | Admit: 2023-08-27 | Discharge: 2023-08-27 | Disposition: A | Discharge status: Home / Self Care | Payer: Managed Care, Other (non HMO) | Source: Ambulatory Visit | Attending: Family Medicine | Admitting: Family Medicine

## 2023-08-27 ENCOUNTER — Ambulatory Visit
Admission: RE | Admit: 2023-08-27 | Discharge: 2023-08-27 | Disposition: A | Discharge status: Home / Self Care | Payer: Managed Care, Other (non HMO) | Attending: Family Medicine | Admitting: Family Medicine

## 2023-08-27 DIAGNOSIS — R202 Paresthesia of skin: Secondary | ICD-10-CM | POA: Insufficient documentation

## 2023-08-27 DIAGNOSIS — M79652 Pain in left thigh: Secondary | ICD-10-CM | POA: Diagnosis present

## 2023-08-27 DIAGNOSIS — M545 Low back pain, unspecified: Secondary | ICD-10-CM | POA: Diagnosis present

## 2023-09-04 ENCOUNTER — Encounter: Payer: Self-pay | Admitting: Family Medicine

## 2023-09-07 ENCOUNTER — Other Ambulatory Visit: Payer: Self-pay | Admitting: Urology

## 2023-09-09 ENCOUNTER — Encounter: Payer: Self-pay | Admitting: Urology

## 2023-09-18 ENCOUNTER — Encounter: Payer: Self-pay | Admitting: Family Medicine

## 2023-10-12 DIAGNOSIS — M170 Bilateral primary osteoarthritis of knee: Secondary | ICD-10-CM | POA: Insufficient documentation

## 2023-10-12 DIAGNOSIS — M1612 Unilateral primary osteoarthritis, left hip: Secondary | ICD-10-CM | POA: Insufficient documentation

## 2023-10-29 ENCOUNTER — Other Ambulatory Visit: Payer: Self-pay | Admitting: Family Medicine

## 2023-10-29 DIAGNOSIS — Z1231 Encounter for screening mammogram for malignant neoplasm of breast: Secondary | ICD-10-CM

## 2023-11-02 ENCOUNTER — Ambulatory Visit
Admission: RE | Admit: 2023-11-02 | Discharge: 2023-11-02 | Disposition: A | Discharge status: Home / Self Care | Source: Ambulatory Visit | Attending: Family Medicine | Admitting: Family Medicine

## 2023-11-02 ENCOUNTER — Ambulatory Visit: Payer: Self-pay | Admitting: Family Medicine

## 2023-11-02 DIAGNOSIS — Z1231 Encounter for screening mammogram for malignant neoplasm of breast: Secondary | ICD-10-CM

## 2023-11-24 ENCOUNTER — Ambulatory Visit: Admission: RE | Admit: 2023-11-24 | Source: Ambulatory Visit

## 2023-11-25 ENCOUNTER — Telehealth: Payer: Self-pay | Admitting: Urology

## 2023-11-25 NOTE — Telephone Encounter (Signed)
 Eden, an Geneticist, molecular from [employer] informed choice team called and left a message in regards to an order that was placed for patient by Dr. MacDiarmid for a Pelvis CT with and w/o contrast. She stated that she needs this order to be faxed over to [facility] at [phone], so they can go ahead and schedule patient. [name] can be reached at [phone] if there are any questions and we can use patient's case number [id number]. Please advise.

## 2023-11-25 NOTE — Telephone Encounter (Signed)
 CT order faxed to [facility].  [name] from [employer] informed.

## 2023-11-27 ENCOUNTER — Encounter: Payer: Self-pay | Admitting: Family Medicine

## 2023-11-27 ENCOUNTER — Ambulatory Visit: Admitting: Family Medicine

## 2023-11-27 VITALS — BP 136/82 | HR 99 | Resp 16 | Ht 61.0 in | Wt 165.9 lb

## 2023-11-27 DIAGNOSIS — I10 Essential (primary) hypertension: Secondary | ICD-10-CM

## 2023-11-27 DIAGNOSIS — G4733 Obstructive sleep apnea (adult) (pediatric): Secondary | ICD-10-CM | POA: Diagnosis not present

## 2023-11-27 DIAGNOSIS — E1169 Type 2 diabetes mellitus with other specified complication: Secondary | ICD-10-CM

## 2023-11-27 DIAGNOSIS — M461 Sacroiliitis, not elsewhere classified: Secondary | ICD-10-CM

## 2023-11-27 DIAGNOSIS — E785 Hyperlipidemia, unspecified: Secondary | ICD-10-CM | POA: Diagnosis not present

## 2023-11-27 DIAGNOSIS — I2510 Atherosclerotic heart disease of native coronary artery without angina pectoris: Secondary | ICD-10-CM

## 2023-11-27 DIAGNOSIS — E66811 Obesity, class 1: Secondary | ICD-10-CM

## 2023-11-27 DIAGNOSIS — R2230 Localized swelling, mass and lump, unspecified upper limb: Secondary | ICD-10-CM

## 2023-11-27 DIAGNOSIS — K219 Gastro-esophageal reflux disease without esophagitis: Secondary | ICD-10-CM

## 2023-11-27 DIAGNOSIS — I119 Hypertensive heart disease without heart failure: Secondary | ICD-10-CM

## 2023-11-27 LAB — POCT GLYCOSYLATED HEMOGLOBIN (HGB A1C): Hemoglobin A1C: 6.2 % — AB (ref 4.0–5.6)

## 2023-11-27 MED ORDER — WEGOVY 1.7 MG/0.75ML ~~LOC~~ SOAJ
1.7000 mg | SUBCUTANEOUS | 0 refills | Status: DC
Start: 2023-11-27 — End: 2024-02-25

## 2023-11-27 MED ORDER — ROSUVASTATIN CALCIUM 20 MG PO TABS
20.0000 mg | ORAL_TABLET | Freq: Every day | ORAL | 1 refills | Status: AC
Start: 2023-11-27 — End: 2024-11-21

## 2023-11-27 MED ORDER — OLMESARTAN-AMLODIPINE-HCTZ 40-5-25 MG PO TABS: 1.0000 | ORAL_TABLET | ORAL | 1 refills | Status: AC

## 2023-11-27 NOTE — Progress Notes (Signed)
 Name: [name]   MRN: [id number]    DOB: [birth year]   Date:11/27/2023       Progress Note  Subjective  Chief Complaint  Chief Complaint  Patient presents with   Medical Management of Chronic Issues   Discussed the use of AI scribe software for clinical note transcription with the patient, who gave verbal consent to proceed.  History of Present Illness [name] is a 58 year old female who presents for a six-month follow-up and evaluation of left hip pain.  She experiences significant pain in her left hip, which has been evaluated with X-rays revealing mild arthritis. She also has mild arthritis in her knees, confirmed by recent imaging. Despite a satisfactory right hip arthroplasty, she experiences pain and pain in  the right sacroiliac joint, which is attributed to inflammation. There is no family history of inflammatory arthritis. She has a past history of heavy dancing, which may have contributed to her joint issues.  She has a history of diabetes, diagnosed approximately one year ago with an elevated A1c. She manages her diabetes with Wegovy , resulting in weight loss from 180 pounds to 165 pounds. Her current A1c is 6.2. She also has hypertension and dyslipidemia, for which she takes olmesartan , amlodipine , HCTZ, and rosuvastatin . . No symptoms of diabetes such as excessive hunger, thirst, or frequent urination.  She mentions a small, non-tender nodule on her right biceps, noticed two nights ago. It is located above a tattoo and does not cause pain. She plans to monitor it for changes in size or appearance.  She has a history of reflux, for which she takes pantoprazole  as needed, particularly after consuming foods that may trigger symptoms. She also has a history of incontinence and is scheduled for a CT scan of her bladder due to persistent urinary issues. She previously tried Gemtesa  for incontinence but discontinued it due to adverse effects. No current anxiety or depression  symptoms. No chest pain or palpitations. Occasional heartburn and indigestion.    Patient Active Problem List   Diagnosis Date Noted   Adenomatous polyp of colon 10/23/2022   Coronary artery disease involving native coronary artery of native heart without angina pectoris 10/13/2022   Lung nodule 10/13/2022   Insomnia 10/13/2022   History of right hip replacement 10/10/2020   OSA (obstructive sleep apnea) 09/08/2019   Ganglion cyst of dorsum of left wrist 02/05/2018   LVH (left ventricular hypertrophy) due to hypertensive disease, without heart failure 09/23/2017   Hyperlipidemia, mixed 08/28/2017   Hypercalcemia due to thiazide and vitamin A 08/25/2017   Benign essential hypertension 08/18/2017   GAD (generalized anxiety disorder) 08/18/2017   GERD (gastroesophageal reflux disease) 08/18/2017   Panic attack 08/18/2017    Past Surgical History:  Procedure Laterality Date   ABDOMINAL HYSTERECTOMY     Complete Hysterectomy   CHOLECYSTECTOMY     COLONOSCOPY WITH PROPOFOL  N/A 10/23/2022   Procedure: COLONOSCOPY WITH PROPOFOL ;  Surgeon: Luke Salaam, MD;  Location: [facility] ENDOSCOPY;  Service: Gastroenterology;  Laterality: N/A;   GANGLION CYST EXCISION Left 03/01/2018   Procedure: REMOVAL GANGLION OF WRIST;  Surgeon: Marlynn Singer, MD;  Location: [facility] ORS;  Service: Orthopedics;  Laterality: Left;   JOINT REPLACEMENT     TOTAL HIP ARTHROPLASTY Right 10/10/2020   Procedure: TOTAL HIP ARTHROPLASTY ANTERIOR APPROACH;  Surgeon: Jerlyn Moons, MD;  Location: [facility] ORS;  Service: Orthopedics;  Laterality: Right;    Family History  Problem Relation Age of Onset   Hypertension Mother  Stroke Father    Hypertension Father    Hypertension Sister    Hypertension Sister    Hypertension Brother    Cerebral aneurysm Daughter    Breast cancer Neg Hx     Social History   Tobacco Use   Smoking status: Former    Current packs/day: 0.00    Average packs/day: 0.5 packs/day for 22.5 years  (11.2 ttl pk-yrs)    Types: Cigarettes    Start date: 08/19/1999    Quit date: 01/30/2022    Years since quitting: 1.8    Passive exposure: Never   Smokeless tobacco: Never  Substance Use Topics   Alcohol use: Yes    Alcohol/week: 1.0 standard drink of alcohol    Types: 1 Glasses of wine per week    Comment: rare on Saturdays      Current Outpatient Medications:    aspirin  EC 81 MG tablet, Take 1 tablet (81 mg total) by mouth daily. Swallow whole., Disp: 90 tablet, Rfl: 3   fluticasone  (FLONASE ) 50 MCG/ACT nasal spray, SHAKE LIQUID AND USE 2 SPRAYS IN EACH NOSTRIL DAILY AS NEEDED FOR ALLERGIES, Disp: 48 g, Rfl: 0   Olmesartan -amLODIPine -HCTZ 40-5-25 MG TABS, Take 1 tablet by mouth every morning., Disp: 90 tablet, Rfl: 1   pantoprazole  (PROTONIX ) 40 MG tablet, Take 1 tablet (40 mg total) by mouth daily., Disp: 90 tablet, Rfl: 1   Semaglutide -Weight Management (WEGOVY ) 1 MG/0.5ML SOAJ, Inject 1 mg into the skin once a week., Disp: 6 mL, Rfl: 0   escitalopram  (LEXAPRO ) 10 MG tablet, Take 1 tablet (10 mg total) by mouth daily. (Patient not taking: Reported on 11/27/2023), Disp: 90 tablet, Rfl: 1   hydrOXYzine  (ATARAX ) 10 MG tablet, Take 1-2 tablets (10-20 mg total) by mouth at bedtime. (Patient not taking: Reported on 11/27/2023), Disp: 90 tablet, Rfl: 1   pregabalin  (LYRICA ) 50 MG capsule, Take 1-2 capsules (50-100 mg total) by mouth 3 (three) times daily. (Patient not taking: Reported on 11/27/2023), Disp: 90 capsule, Rfl: 0   rosuvastatin  (CRESTOR ) 20 MG tablet, Take 1 tablet (20 mg total) by mouth daily., Disp: 90 tablet, Rfl: 3   Vibegron  (GEMTESA ) 75 MG TABS, Take 1 tablet (75 mg total) by mouth daily. (Patient not taking: Reported on 11/27/2023), Disp: , Rfl:   Allergies  Allergen Reactions   Penicillins Itching and Swelling    Has patient had a PCN reaction causing immediate rash, facial/tongue/throat swelling, SOB or lightheadedness with hypotension: No Has patient had a PCN reaction causing  severe rash involving mucus membranes or skin necrosis: No Has patient had a PCN reaction that required hospitalization: No Has patient had a PCN reaction occurring within the last 10 years: No If all of the above answers are "NO", then may proceed with Cephalosporin use.     I personally reviewed active problem list, medication list, allergies with the patient/caregiver today.   ROS  Ten systems reviewed and is negative except as mentioned in HPI    Objective Physical Exam  CONSTITUTIONAL: Patient appears well-developed and well-nourished.  ObeseNo distress. HEENT: Head atraumatic, normocephalic, neck supple. CARDIOVASCULAR: Normal rate, regular rhythm and normal heart sounds. No murmur heard. No BLE edema. PULMONARY: Effort normal and breath sounds normal. No respiratory distress. ABDOMINAL: There is no tenderness or distention. MUSCULOSKELETAL: pain during palpation of lumbar spine, slow gait PSYCHIATRIC: Patient has a normal mood and affect. Behavior is normal. Judgment and thought content normal. SKIN: Small nodule on right biceps above tattoo.  Vitals:  11/27/23 1534  BP: 136/82  Pulse: 99  Resp: 16  SpO2: 100%  Weight: 165 lb 14.4 oz (75.3 kg)  Height: 5\' 1"  (1.549 m)    Body mass index is 31.35 kg/m.  Recent Results (from the past 2160 hours)  POCT glycosylated hemoglobin (Hb A1C)     Status: Abnormal   Collection Time: 11/27/23  3:38 PM  Result Value Ref Range   Hemoglobin A1C 6.2 (A) 4.0 - 5.6 %   HbA1c POC (<> result, manual entry)     HbA1c, POC (prediabetic range)     HbA1c, POC (controlled diabetic range)       PHQ2/9:    11/27/2023    3:26 PM 07/31/2023    8:12 AM 05/04/2023    8:33 AM 01/15/2023    8:40 AM 10/13/2022   10:50 AM  Depression screen PHQ 2/9  Decreased Interest 0 0 1 0 0  Down, Depressed, Hopeless 0 0 0 0 0  PHQ - 2 Score 0 0 1 0 0  Altered sleeping 0 0 0 1 3  Tired, decreased energy 0 0 0 0 0  Change in appetite 0 0 0 0 0   Feeling bad or failure about yourself  0 0 0 0 0  Trouble concentrating 0 0 0 0 0  Moving slowly or fidgety/restless 0 0 0 0 0  Suicidal thoughts 0 0 0 0 0  PHQ-9 Score 0 0 1 1 3   Difficult doing work/chores Not difficult at all Not difficult at all       phq 9 is negative  Fall Risk:    07/31/2023    8:07 AM 05/04/2023    8:32 AM 01/15/2023    8:40 AM 10/13/2022   10:50 AM 07/11/2022    2:03 PM  Fall Risk   Falls in the past year? 0 0 0 0 0  Number falls in past yr: 0 0 0 0   Injury with Fall? 0 0 0 0   Risk for fall due to : No Fall Risks No Fall Risks No Fall Risks No Fall Risks No Fall Risks  Follow up Falls prevention discussed;Education provided;Falls evaluation completed Falls prevention discussed Falls prevention discussed Falls prevention discussed Falls prevention discussed;Education provided;Falls evaluation completed   Assessment & Plan Hypertension Blood pressure 136/82 mmHg, influenced by weight gain and pain. On olmesartan , amlodipine , and HCTZ. - Recheck blood pressure before leaving clinic. - Continue current antihypertensive regimen.  Type 2 diabetes mellitus A1c 6.2%, controlled. On Wegovy  for diabetes and weight loss. Advised diabetic diet and reduced sugary beverages. - Continue Wegovy , consider dose increase to 1.7 mg after three months. - Check blood work including A1c, kidney function, and proteinuria.  Obesity Weight 165 lbs, BMI >30, recent 6 lbs gain. Emphasized dietary changes and medication for weight management. - Continue Wegovy , consider dose increase to 1.7 mg after three months. - Encourage adherence to diabetic diet and reduction of sugary beverages.  Dyslipidemia Managed with rosuvastatin , refill needed due to lack of recent blood work. - Refill rosuvastatin  20 mg - she has been out of medication for the past month  - Check lipid panel.  Proteinuria Microalbuminuria requires annual monitoring. - Check protein in  urine.  Sacroiliitis Right sacroiliac joint inflammation, managed with meloxicam . Consider inflammatory arthritis, possible rheumatology referral. - Continue meloxicam  with food. - Use elastic lumbar brace for support. - Apply ice and heat as needed. - Consider cortisone injection if no improvement. - Check inflammatory markers including  sed rate and C-reactive protein.  Osteoarthritis of left hip Mild arthritis confirmed by x-ray, managed with meloxicam . Orthopedic follow-up planned - Dr. Annabell Key. - Continue meloxicam  with food. - Follow up with orthopedic specialist in four weeks.  Osteoarthritis of knee Mild arthritis noted on x-ray.  Gastroesophageal reflux disease (GERD) Intermittent symptoms managed with pantoprazole  as needed. - Continue pantoprazole  as needed.

## 2023-11-30 ENCOUNTER — Other Ambulatory Visit (HOSPITAL_COMMUNITY): Payer: Self-pay

## 2023-11-30 ENCOUNTER — Encounter: Payer: Self-pay | Admitting: Family Medicine

## 2023-11-30 ENCOUNTER — Other Ambulatory Visit: Payer: Self-pay | Admitting: Family Medicine

## 2023-11-30 ENCOUNTER — Telehealth: Payer: Self-pay | Admitting: Pharmacy Technician

## 2023-11-30 DIAGNOSIS — R5383 Other fatigue: Secondary | ICD-10-CM

## 2023-11-30 DIAGNOSIS — R768 Other specified abnormal immunological findings in serum: Secondary | ICD-10-CM

## 2023-11-30 DIAGNOSIS — M461 Sacroiliitis, not elsewhere classified: Secondary | ICD-10-CM

## 2023-11-30 LAB — COMPREHENSIVE METABOLIC PANEL WITH GFR
AG Ratio: 1.6 (calc) (ref 1.0–2.5)
ALT: 32 U/L — ABNORMAL HIGH (ref 6–29)
AST: 22 U/L (ref 10–35)
Albumin: 5.1 g/dL (ref 3.6–5.1)
Alkaline phosphatase (APISO): 70 U/L (ref 37–153)
BUN: 19 mg/dL (ref 7–25)
CO2: 28 mmol/L (ref 20–32)
Calcium: 10.7 mg/dL — ABNORMAL HIGH (ref 8.6–10.4)
Chloride: 102 mmol/L (ref 98–110)
Creat: 0.84 mg/dL (ref 0.50–1.03)
Globulin: 3.1 g/dL (ref 1.9–3.7)
Glucose, Bld: 80 mg/dL (ref 65–99)
Potassium: 4.2 mmol/L (ref 3.5–5.3)
Sodium: 139 mmol/L (ref 135–146)
Total Bilirubin: 0.5 mg/dL (ref 0.2–1.2)
Total Protein: 8.2 g/dL — ABNORMAL HIGH (ref 6.1–8.1)
eGFR: 82 mL/min/{1.73_m2} (ref >=60)

## 2023-11-30 LAB — MICROALBUMIN / CREATININE URINE RATIO
Creatinine, Urine: 103 mg/dL (ref 20–275)
Microalb Creat Ratio: 3 mg/g{creat} (ref <30)
Microalb, Ur: 0.3 mg/dL

## 2023-11-30 LAB — ANTI-NUCLEAR AB-TITER (ANA TITER)
ANA TITER: 1:40 {titer} — ABNORMAL HIGH
ANA Titer 1: 1:320 {titer} — ABNORMAL HIGH

## 2023-11-30 LAB — CBC WITH DIFFERENTIAL/PLATELET
Absolute Lymphocytes: 2626 {cells}/uL (ref 850–3900)
Absolute Monocytes: 342 {cells}/uL (ref 200–950)
Basophils Absolute: 12 {cells}/uL (ref 0–200)
Basophils Relative: 0.2 %
Eosinophils Absolute: 41 {cells}/uL (ref 15–500)
Eosinophils Relative: 0.7 %
HCT: 43.1 % (ref 35.0–45.0)
Hemoglobin: 13.8 g/dL (ref 11.7–15.5)
MCH: 26.3 pg — ABNORMAL LOW (ref 27.0–33.0)
MCHC: 32 g/dL (ref 32.0–36.0)
MCV: 82.3 fL (ref 80.0–100.0)
MPV: 10.1 fL (ref 7.5–12.5)
Monocytes Relative: 5.8 %
Neutro Abs: 2879 {cells}/uL (ref 1500–7800)
Neutrophils Relative %: 48.8 %
Platelets: 294 10*3/uL (ref 140–400)
RBC: 5.24 10*6/uL — ABNORMAL HIGH (ref 3.80–5.10)
RDW: 13 % (ref 11.0–15.0)
Total Lymphocyte: 44.5 %
WBC: 5.9 10*3/uL (ref 3.8–10.8)

## 2023-11-30 LAB — LIPID PANEL
Cholesterol: 229 mg/dL — ABNORMAL HIGH (ref <200)
HDL: 70 mg/dL (ref >=50)
LDL Cholesterol (Calc): 139 mg/dL — ABNORMAL HIGH
Non-HDL Cholesterol (Calc): 159 mg/dL — ABNORMAL HIGH (ref <130)
Total CHOL/HDL Ratio: 3.3 (calc) (ref <5.0)
Triglycerides: 95 mg/dL (ref <150)

## 2023-11-30 LAB — CYCLIC CITRUL PEPTIDE ANTIBODY, IGG: Cyclic Citrullin Peptide Ab: <16 U

## 2023-11-30 LAB — C-REACTIVE PROTEIN: CRP: <3 mg/L (ref <8.0)

## 2023-11-30 LAB — SEDIMENTATION RATE: Sed Rate: 11 mm/h (ref 0–30)

## 2023-11-30 LAB — ANA: Anti Nuclear Antibody (ANA): POSITIVE — AB

## 2023-11-30 LAB — RHEUMATOID FACTOR: Rheumatoid fact SerPl-aCnc: <10 [IU]/mL (ref <14)

## 2023-11-30 NOTE — Telephone Encounter (Signed)
 Pharmacy Patient Advocate Encounter   Received notification from Onbase that prior authorization for Wegovy  1.7MG /0.75ML auto-injectors is required/requested.   Insurance verification completed.   The patient is insured through [employer] .   Per test claim: Refill too soon. PA is not needed at this time. Medication was filled 11/28/23. Next eligible fill date is 12/19/23.

## 2024-02-25 ENCOUNTER — Ambulatory Visit: Admitting: Family Medicine

## 2024-02-25 ENCOUNTER — Encounter: Payer: Self-pay | Admitting: Family Medicine

## 2024-02-25 VITALS — BP 128/78 | HR 90 | Resp 16 | Ht 61.0 in | Wt 171.6 lb

## 2024-02-25 DIAGNOSIS — R768 Other specified abnormal immunological findings in serum: Secondary | ICD-10-CM | POA: Diagnosis not present

## 2024-02-25 DIAGNOSIS — M461 Sacroiliitis, not elsewhere classified: Secondary | ICD-10-CM | POA: Diagnosis not present

## 2024-02-25 DIAGNOSIS — E785 Hyperlipidemia, unspecified: Secondary | ICD-10-CM

## 2024-02-25 DIAGNOSIS — E1169 Type 2 diabetes mellitus with other specified complication: Secondary | ICD-10-CM

## 2024-02-25 DIAGNOSIS — M2550 Pain in unspecified joint: Secondary | ICD-10-CM

## 2024-02-25 DIAGNOSIS — I10 Essential (primary) hypertension: Secondary | ICD-10-CM

## 2024-02-25 DIAGNOSIS — E66811 Obesity, class 1: Secondary | ICD-10-CM

## 2024-02-25 DIAGNOSIS — G4733 Obstructive sleep apnea (adult) (pediatric): Secondary | ICD-10-CM

## 2024-02-25 DIAGNOSIS — R7303 Prediabetes: Secondary | ICD-10-CM

## 2024-02-25 DIAGNOSIS — I2510 Atherosclerotic heart disease of native coronary artery without angina pectoris: Secondary | ICD-10-CM

## 2024-02-25 DIAGNOSIS — K219 Gastro-esophageal reflux disease without esophagitis: Secondary | ICD-10-CM

## 2024-02-25 LAB — POCT GLYCOSYLATED HEMOGLOBIN (HGB A1C): Hemoglobin A1C: 6.1 % — AB (ref 4.0–5.6)

## 2024-02-25 MED ORDER — WEGOVY 2.4 MG/0.75ML ~~LOC~~ SOAJ: 2.4000 mg | SUBCUTANEOUS | 0 refills | Status: DC

## 2024-02-25 MED ORDER — PANTOPRAZOLE SODIUM 20 MG PO TBEC: 20.0000 mg | DELAYED_RELEASE_TABLET | Freq: Every day | ORAL | 1 refills | Status: AC

## 2024-02-25 NOTE — Progress Notes (Signed)
 Name: [name]   MRN: [id number]    DOB: [birth year]   Date:02/25/2024       Progress Note  Subjective  Chief Complaint  Chief Complaint  Patient presents with   Medical Management of Chronic Issues   HPI   Polyarthralgia, going on for over 6 months, described as pain , difficulty walking due to feet, ankles and knee stiffness and pain every morning that lasts at least 1 hours . She had positive ANA , previous history of sacroiliitis. Father had personal history of arthritis that causes hand deformities.   Personal of DM, A1C has been controlled, today is down to 6.1 %. Denies polyphagia, polydipsia or polyuria   HTN well controlled with medication, denies chest pain, palpitation or sob  GERD has not been controlled lately, she has been out of PPI and would like a refill, willing to try lower dose  Obesity, she started Wegovy  March 2024, she did very well and weight went down to 157 lbs back in Fall 2024 however that was supply gap and weight went up again, currently taking Wegovy  1.7 mg and even though replacing unhealthy snacks like cookies for fruit, trying to walk dog daily at lunch and drinking more water weight is now trending up again. She still drink sodas but down to 2 times a week. She is also eating smaller portions.    Patient Active Problem List   Diagnosis Date Noted   Osteoarthritis of both knees 10/12/2023   Osteoarthritis of left hip 10/12/2023   Adenomatous polyp of colon 10/23/2022   Coronary artery disease involving native coronary artery of native heart without angina pectoris 10/13/2022   Lung nodule 10/13/2022   Insomnia 10/13/2022   History of right hip replacement 10/10/2020   OSA (obstructive sleep apnea) 09/08/2019   Ganglion cyst of dorsum of left wrist 02/05/2018   LVH (left ventricular hypertrophy) due to hypertensive disease, without heart failure 09/23/2017   Hyperlipidemia, mixed 08/28/2017   Hypercalcemia due to thiazide and vitamin A  08/25/2017   Benign essential hypertension 08/18/2017   GAD (generalized anxiety disorder) 08/18/2017   GERD (gastroesophageal reflux disease) 08/18/2017   Panic attack 08/18/2017    Past Surgical History:  Procedure Laterality Date   ABDOMINAL HYSTERECTOMY     Complete Hysterectomy   CHOLECYSTECTOMY     COLONOSCOPY WITH PROPOFOL  N/A 10/23/2022   Procedure: COLONOSCOPY WITH PROPOFOL ;  Surgeon: Chelsea Bi, MD;  [facility]: [name];  Service: Gastroenterology;  Laterality: N/A;   GANGLION CYST EXCISION Left 03/01/2018   Procedure: REMOVAL GANGLION OF WRIST;  Surgeon: Chelsea Barrio, MD;  [facility]: [name];  Service: Orthopedics;  Laterality: Left;   JOINT REPLACEMENT     TOTAL HIP ARTHROPLASTY Right 10/10/2020   Procedure: TOTAL HIP ARTHROPLASTY ANTERIOR APPROACH;  Surgeon: Chelsea Lynwood SAUNDERS, MD;  Location: [name];  Service: Orthopedics;  Laterality: Right;    Family History  Problem Relation Age of Onset   Hypertension Mother    Stroke Father    Hypertension Father    Hypertension Sister    Hypertension Sister    Hypertension Brother    Cerebral aneurysm Daughter    Breast cancer Neg Hx     Social History   Tobacco Use   Smoking status: Former    Current packs/day: 0.00    Average packs/day: 0.5 packs/day for 22.5 years (11.2 ttl pk-yrs)    Types: Cigarettes    Start date: 08/19/1999    Quit date: 01/30/2022    Years  since quitting: 2.0    Passive exposure: Never   Smokeless tobacco: Never  Substance Use Topics   Alcohol use: Yes    Alcohol/week: 1.0 standard drink of alcohol    Types: 1 Glasses of wine per week    Comment: rare on Saturdays      Current Outpatient Medications:    aspirin  EC 81 MG tablet, Take 1 tablet (81 mg total) by mouth daily. Swallow whole., Disp: 90 tablet, Rfl: 3   fluticasone  (FLONASE ) 50 MCG/ACT nasal spray, SHAKE LIQUID AND USE 2 SPRAYS IN EACH NOSTRIL DAILY AS NEEDED FOR ALLERGIES, Disp: 48 g, Rfl: 0   Olmesartan -amLODIPine -HCTZ  40-5-25 MG TABS, Take 1 tablet by mouth every morning., Disp: 90 tablet, Rfl: 1   rosuvastatin  (CRESTOR ) 20 MG tablet, Take 1 tablet (20 mg total) by mouth daily., Disp: 90 tablet, Rfl: 1   Semaglutide -Weight Management (WEGOVY ) 1.7 MG/0.75ML SOAJ, Inject 1.7 mg into the skin once a week., Disp: 9 mL, Rfl: 0   pantoprazole  (PROTONIX ) 40 MG tablet, Take 1 tablet (40 mg total) by mouth daily. (Patient not taking: Reported on 02/25/2024), Disp: 90 tablet, Rfl: 1  Allergies  Allergen Reactions   Penicillins Itching and Swelling    Has patient had a PCN reaction causing immediate rash, facial/tongue/throat swelling, SOB or lightheadedness with hypotension: No Has patient had a PCN reaction causing severe rash involving mucus membranes or skin necrosis: No Has patient had a PCN reaction that required hospitalization: No Has patient had a PCN reaction occurring within the last 10 years: No If all of the above answers are NO, then may proceed with Cephalosporin use.     I personally reviewed active problem list, medication list, allergies, family history with the patient/caregiver today.   ROS  Ten systems reviewed and is negative except as mentioned in HPI    Objective Physical Exam Constitutional: Patient appears well-developed and well-nourished. Obese  No distress.  HEENT: head atraumatic, normocephalic, pupils equal and reactive to light, neck supple Cardiovascular: Normal rate, regular rhythm and normal heart sounds.  No murmur heard. No BLE edema. Pulmonary/Chest: Effort normal and breath sounds normal. No respiratory distress. Abdominal: Soft.  There is no tenderness. Psychiatric: Patient has a normal mood and affect. behavior is normal. Judgment and thought content normal.   Vitals:   02/25/24 1506  BP: 128/78  Pulse: 90  Resp: 16  SpO2: 98%  Weight: 171 lb 9.6 oz (77.8 kg)  Height: 5' 1 (1.549 m)    Body mass index is 32.42 kg/m.  Recent Results (from the past 2160  hours)  POCT glycosylated hemoglobin (Hb A1C)     Status: Abnormal   Collection Time: 11/27/23  3:38 PM  Result Value Ref Range   Hemoglobin A1C 6.2 (A) 4.0 - 5.6 %   HbA1c POC (<> result, manual entry)     HbA1c, POC (prediabetic range)     HbA1c, POC (controlled diabetic range)    Lipid panel     Status: Abnormal   Collection Time: 11/27/23  4:05 PM  Result Value Ref Range   Cholesterol 229 (H) <200 mg/dL   HDL 70 > OR = 50 mg/dL   Triglycerides 95 <849 mg/dL   LDL Cholesterol (Calc) 139 (H) mg/dL (calc)    Comment: Reference range: <100 . Desirable range <100 mg/dL for primary prevention;   <70 mg/dL for patients with CHD or diabetic patients  with > or = 2 CHD risk factors. SABRA LDL-C is now calculated using  the Martin-Hopkins  calculation, which is a validated novel method providing  better accuracy than the Friedewald equation in the  estimation of LDL-C.  Gladis APPLETHWAITE et al. SANDREA. 7986;689(80): 2061-2068  (http://education.[facility].com/faq/FAQ164)    Total CHOL/HDL Ratio 3.3 <5.0 (calc)   Non-HDL Cholesterol (Calc) 159 (H) <130 mg/dL (calc)    Comment: For patients with diabetes plus 1 major ASCVD risk  factor, treating to a non-HDL-C goal of <100 mg/dL  (LDL-C of <29 mg/dL) is considered a therapeutic  option.   Microalbumin / creatinine urine ratio     Status: None   Collection Time: 11/27/23  4:05 PM  Result Value Ref Range   Creatinine, Urine 103 20 - 275 mg/dL   Microalb, Ur 0.3 mg/dL    Comment: Reference Range Not established    Microalb Creat Ratio 3 <30 mg/g creat    Comment: . The ADA defines abnormalities in albumin excretion as follows: SABRA Albuminuria Category        Result (mg/g creatinine) . Normal to Mildly increased   <30 Moderately increased         30-299  Severely increased           > OR = 300 . The ADA recommends that at least two of three specimens collected within a 3-6 month period be abnormal before considering a patient to  be within a diagnostic category.   CBC with Differential/Platelet     Status: Abnormal   Collection Time: 11/27/23  4:05 PM  Result Value Ref Range   WBC 5.9 3.8 - 10.8 Thousand/uL   RBC 5.24 (H) 3.80 - 5.10 Million/uL   Hemoglobin 13.8 11.7 - 15.5 g/dL   HCT 56.8 64.9 - 54.9 %   MCV 82.3 80.0 - 100.0 fL   MCH 26.3 (L) 27.0 - 33.0 pg   MCHC 32.0 32.0 - 36.0 g/dL    Comment: For adults, a slight decrease in the calculated MCHC value (in the range of 30 to 32 g/dL) is most likely not clinically significant; however, it should be interpreted with caution in correlation with other red cell parameters and the patient's clinical condition.    RDW 13.0 11.0 - 15.0 %   Platelets 294 140 - 400 Thousand/uL   MPV 10.1 7.5 - 12.5 fL   Neutro Abs 2,879 1,500 - 7,800 cells/uL   Absolute Lymphocytes 2,626 850 - 3,900 cells/uL   Absolute Monocytes 342 200 - 950 cells/uL   Eosinophils Absolute 41 15 - 500 cells/uL   Basophils Absolute 12 0 - 200 cells/uL   Neutrophils Relative % 48.8 %   Total Lymphocyte 44.5 %   Monocytes Relative 5.8 %   Eosinophils Relative 0.7 %   Basophils Relative 0.2 %  Comprehensive metabolic panel with GFR     Status: Abnormal   Collection Time: 11/27/23  4:05 PM  Result Value Ref Range   Glucose, Bld 80 65 - 99 mg/dL    Comment: .            Fasting reference interval .    BUN 19 7 - 25 mg/dL   Creat 9.15 9.49 - 8.96 mg/dL   eGFR 82 > OR = 60 fO/fpw/8.26f7   BUN/Creatinine Ratio SEE NOTE: 6 - 22 (calc)    Comment:    Not Reported: BUN and Creatinine are within    reference range. .    Sodium 139 135 - 146 mmol/L   Potassium 4.2 3.5 - 5.3 mmol/L   Chloride 102  98 - 110 mmol/L   CO2 28 20 - 32 mmol/L   Calcium  10.7 (H) 8.6 - 10.4 mg/dL   Total Protein 8.2 (H) 6.1 - 8.1 g/dL   Albumin 5.1 3.6 - 5.1 g/dL   Globulin 3.1 1.9 - 3.7 g/dL (calc)   AG Ratio 1.6 1.0 - 2.5 (calc)   Total Bilirubin 0.5 0.2 - 1.2 mg/dL   Alkaline phosphatase (APISO) 70 37 - 153  U/L   AST 22 10 - 35 U/L   ALT 32 (H) 6 - 29 U/L  C-reactive protein     Status: None   Collection Time: 11/27/23  4:05 PM  Result Value Ref Range   CRP <3.0 <8.0 mg/L  Cyclic citrul peptide antibody, IgG     Status: None   Collection Time: 11/27/23  4:05 PM  Result Value Ref Range   Cyclic Citrullin Peptide Ab <83 UNITS    Comment: Reference Range Negative:            <20 Weak Positive:       20-39 Moderate Positive:   40-59 Strong Positive:     >59 .   ANA     Status: Abnormal   Collection Time: 11/27/23  4:05 PM  Result Value Ref Range   Anti Nuclear Antibody (ANA) POSITIVE (A) NEGATIVE    Comment: ANA IFA is a first line screen for detecting the presence of up to approximately 150 autoantibodies in various autoimmune diseases. A positive ANA IFA result is suggestive of autoimmune disease and reflexes to titer and pattern. Further laboratory testing may be considered if clinically indicated. . For additional information, please refer to http://education.[facility].com/faq/FAQ177 (This link is being provided for informational/ educational purposes only.) .   Rheumatoid factor     Status: None   Collection Time: 11/27/23  4:05 PM  Result Value Ref Range   Rheumatoid fact SerPl-aCnc <10 <14 IU/mL  Sedimentation rate     Status: None   Collection Time: 11/27/23  4:05 PM  Result Value Ref Range   Sed Rate 11 0 - 30 mm/h  Anti-nuclear ab-titer (ANA titer)     Status: Abnormal   Collection Time: 11/27/23  4:05 PM  Result Value Ref Range   ANA Titer 1 1:320 (H) titer    Comment:                 Reference Range                 <1:40        Negative                 1:40-1:80    Low Antibody Level                 >1:80        Elevated Antibody Level .    ANA Pattern 1 Nuclear, Nucleolar (A)     Comment: Nucleolar pattern is associated with systemic sclerosis (scleroderma), systemic sclerosis/polymyositis overlap and Sjogren's syndrome. . AC-8,9,10:  Nucleolar . International Consensus on ANA Patterns ([email or web address])    ANA TITER 1:40 (H) titer    Comment: A low level ANA titer may be present in pre-clinical autoimmune diseases and normal individuals.                 Reference Range                 <1:40        Negative  1:40-1:80    Low Antibody Level                 >1:80        Elevated Antibody Level .    ANA PATTERN Nuclear, Speckled (A)     Comment: Speckled pattern is associated with mixed connective tissue disease (MCTD), systemic lupus erythematosus (SLE), Sjogren's syndrome, dermatomyositis, and  systemic sclerosis/polymyositis overlap. . AC-2,4,5,29: Speckled . International Consensus on ANA Patterns ([email or web address])   POCT glycosylated hemoglobin (Hb A1C)     Status: Abnormal   Collection Time: 02/25/24  3:10 PM  Result Value Ref Range   Hemoglobin A1C 6.1 (A) 4.0 - 5.6 %   HbA1c POC (<> result, manual entry)     HbA1c, POC (prediabetic range)     HbA1c, POC (controlled diabetic range)      Diabetic Foot Exam:     PHQ2/9:    02/25/2024    2:59 PM 11/27/2023    3:26 PM 07/31/2023    8:12 AM 05/04/2023    8:33 AM 01/15/2023    8:40 AM  Depression screen PHQ 2/9  Decreased Interest 0 0 0 1 0  Down, Depressed, Hopeless 0 0 0 0 0  PHQ - 2 Score 0 0 0 1 0  Altered sleeping  0 0 0 1  Tired, decreased energy  0 0 0 0  Change in appetite  0 0 0 0  Feeling bad or failure about yourself   0 0 0 0  Trouble concentrating  0 0 0 0  Moving slowly or fidgety/restless  0 0 0 0  Suicidal thoughts  0 0 0 0  PHQ-9 Score  0 0 1 1  Difficult doing work/chores  Not difficult at all Not difficult at all      phq 9 is negative  Fall Risk:    02/25/2024    2:59 PM 07/31/2023    8:07 AM 05/04/2023    8:32 AM 01/15/2023    8:40 AM 10/13/2022   10:50 AM  Fall Risk   Falls in the past year? 0 0 0 0 0  Number falls in past yr: 0 0 0 0 0  Injury with Fall? 0 0  0 0 0  Risk for fall due to : No Fall Risks No Fall Risks No Fall Risks No Fall Risks No Fall Risks  Follow up Falls evaluation completed Falls prevention discussed;Education provided;Falls evaluation completed Falls prevention discussed Falls prevention discussed Falls prevention discussed     Assessment & Plan  1. Dyslipidemia associated with type 2 diabetes mellitus (HCC) (Primary)  - POCT glycosylated hemoglobin (Hb A1C)  2. Positive ANA (antinuclear antibody)  - Autoimmune Profile - Ambulatory referral to Rheumatology  3. Sacroiliitis (HCC)  - Autoimmune Profile - Ambulatory referral to Rheumatology  4. Polyarthralgia  - Autoimmune Profile - Ambulatory referral to Rheumatology  5. OSA (obstructive sleep apnea)  Not on CPAP - lost weight , no longer snoring   6. Benign essential hypertension  Bp is at goal   7. Coronary artery disease involving native coronary artery of native heart without angina pectoris  Continue statin therapy , no chest pain   8. Obesity (BMI 30.0-34.9)  Discussed all optiosns for weight loss medications including Belviq, Qsymia, Saxenda  and Contrave. Discussed risk and benefits of each of them.  - Semaglutide -Weight Management (WEGOVY ) 2.4 MG/0.75ML SOAJ; Inject 2.4 mg into the skin once a week.  Dispense: 9 mL; Refill: 0  9. Pre-diabetes  On life style modification and also GLP-1 agonist   10. Gastroesophageal reflux disease without esophagitis  - pantoprazole  (PROTONIX ) 20 MG tablet; Take 1 tablet (20 mg total) by mouth daily.  Dispense: 90 tablet; Refill: 1

## 2024-02-26 ENCOUNTER — Ambulatory Visit: Admitting: Family Medicine

## 2024-03-18 ENCOUNTER — Telehealth: Payer: Self-pay | Admitting: Family Medicine

## 2024-03-18 NOTE — Telephone Encounter (Signed)
 Copied from CRM [id number]. Topic: Appointments - Scheduling Inquiry for Clinic >> Mar 18, 2024  2:17 PM Donee H wrote: Reason for CRM: Patient wants to see if it's any way possible to have Physical done by Dr. Glenard before Aug 29. Patient states Physical has too be done by Aug 29 and it's for insurance purposes. Please follow up with patient on request. [phone]

## 2024-03-18 NOTE — Telephone Encounter (Signed)
 Spoke with pt and changed her appt to Julie

## 2024-03-21 ENCOUNTER — Ambulatory Visit (INDEPENDENT_AMBULATORY_CARE_PROVIDER_SITE_OTHER): Admitting: Nurse Practitioner

## 2024-03-21 ENCOUNTER — Encounter: Payer: Self-pay | Admitting: Nurse Practitioner

## 2024-03-21 VITALS — BP 128/80 | HR 80 | Temp 97.7°F | Resp 18 | Ht 61.0 in | Wt 165.3 lb

## 2024-03-21 DIAGNOSIS — Z0000 Encounter for general adult medical examination without abnormal findings: Secondary | ICD-10-CM | POA: Diagnosis not present

## 2024-03-21 NOTE — Progress Notes (Signed)
 Name: [name]   MRN: [id number]    DOB: 11-24-1965   Date:03/21/2024       Progress Note  Subjective  Chief Complaint  Chief Complaint  Patient presents with   Annual Exam    HPI  Patient presents for annual CPE. Discussed the use of AI scribe software for clinical note transcription with the patient, who gave verbal consent to proceed.  History of Present Illness [name] is a 58 year old female who presents for an annual physical exam.  General health maintenance - Presenting for annual physical examination - No current health issues - Up to date on mammogram (last on November 02, 2023) - Up to date on colon cancer screening - Last EKG on January 21, 2023  Genitourinary symptoms - No incontinence  Mental health screening - Negative depression screening  Personal safety and social environment - No violence at home - Not currently sexually active  Functional status and lifestyle - Waist circumference 40.5 inches - Weight 165 pounds - BMI 31.23 - Does not engage in regular exercise - Walks daily as part of part-time job at [employer] - Obtains approximately seven hours of sleep per night  Preventive care and screening - Last dental exam approximately six to seven months ago - Needs to schedule eye exam  Advance care planning - Desires husband to make medical decisions if unable to do so    Diet: well balanced diet Exercise: walking diet  Sleep: 7 hours Last dental exam:7 months ago Last eye exam: needs to schedule  Flowsheet Row Office Visit from 07/06/2020 in [facility]  AUDIT-C Score 0   Depression: Phq 9 is  negative    03/21/2024    9:50 AM 02/25/2024    2:59 PM 11/27/2023    3:26 PM 07/31/2023    8:12 AM 05/04/2023    8:33 AM  Depression screen PHQ 2/9  Decreased Interest 0 0 0 0 1  Down, Depressed, Hopeless 0 0 0 0 0  PHQ - 2 Score 0 0 0 0 1  Altered sleeping 0  0 0 0  Tired, decreased energy 0  0 0 0  Change in  appetite 0  0 0 0  Feeling bad or failure about yourself  0  0 0 0  Trouble concentrating 0  0 0 0  Moving slowly or fidgety/restless 0  0 0 0  Suicidal thoughts 0  0 0 0  PHQ-9 Score 0  0 0 1  Difficult doing work/chores Not difficult at all  Not difficult at all Not difficult at all    Hypertension: BP Readings from Last 3 Encounters:  03/21/24 128/80  02/25/24 128/78  11/27/23 136/82   Obesity: Wt Readings from Last 3 Encounters:  03/21/24 165 lb 4.8 oz (75 kg)  02/25/24 171 lb 9.6 oz (77.8 kg)  11/27/23 165 lb 14.4 oz (75.3 kg)   BMI Readings from Last 3 Encounters:  03/21/24 31.23 kg/m  02/25/24 32.42 kg/m  11/27/23 31.35 kg/m    [employer] Visit from 03/21/2024 in [facility] Office Visit from 02/25/2024 in [facility]  1 40.5 inches 40.5 inches     Vaccines:  HPV: up to at age 41 , ask insurance if age between 21-45  Shingrix: 10-64 yo and ask insurance if covered when patient above 49 yo Pneumonia:  educated and discussed with patient. Flu:  educated and discussed with patient.  Hep  C Screening: completed STD testing and prevention (HIV/chl/gon/syphilis): completed Intimate partner violence:none Sexual History : not currently Menstrual History/LMP/Abnormal Bleeding: hysterectomy  Incontinence Symptoms: none  Breast cancer:  - Last Mammogram: 11/02/2023 - BRCA gene screening: none  Osteoporosis: Discussed high calcium  and vitamin D supplementation, weight bearing exercises  Cervical cancer screening: hysterectomy   Skin cancer: Discussed monitoring for atypical lesions  Colorectal cancer: 10/23/2022   Lung cancer:   Low Dose CT Chest recommended if Age 57-80 years, 20 pack-year currently smoking OR have quit w/in 15years. Patient does not qualify.   ECG: 01/21/2023  Advanced Care Planning: A voluntary discussion about advance care planning including the explanation and discussion of advance  directives.  Discussed health care proxy and Living will, and the patient was able to identify a health care proxy as husband.  Patient does not have a living will at present time. If patient does have living will, I have requested they bring this to the clinic to be scanned in to their chart.  Lipids: Lab Results  Component Value Date   CHOL 229 (H) 11/27/2023   CHOL 162 10/14/2022   CHOL 139 08/16/2021   Lab Results  Component Value Date   HDL 70 11/27/2023   HDL 66 10/14/2022   HDL 52 08/16/2021   Lab Results  Component Value Date   LDLCALC 139 (H) 11/27/2023   LDLCALC 78 10/14/2022   LDLCALC 71 08/16/2021   Lab Results  Component Value Date   TRIG 95 11/27/2023   TRIG 98 10/14/2022   TRIG 80 08/16/2021   Lab Results  Component Value Date   CHOLHDL 3.3 11/27/2023   CHOLHDL 2.5 10/14/2022   CHOLHDL 2.7 08/16/2021   No results found for: LDLDIRECT  Glucose: Glucose  Date Value Ref Range Status  10/14/2022 106 (H) 70 - 99 mg/dL Final  91/74/7985 882 (H) 65 - 99 mg/dL Final   Glucose, Bld  Date Value Ref Range Status  11/27/2023 80 65 - 99 mg/dL Final    Comment:    .            Fasting reference interval .   01/21/2023 111 (H) 70 - 99 mg/dL Final    Comment:    Glucose reference range applies only to samples taken after fasting for at least 8 hours.  08/27/2022 110 (H) 70 - 99 mg/dL Final    Comment:    Glucose reference range applies only to samples taken after fasting for at least 8 hours.   Glucose-Capillary  Date Value Ref Range Status  10/23/2022 121 (H) 70 - 99 mg/dL Final    Comment:    Glucose reference range applies only to samples taken after fasting for at least 8 hours.  01/28/2019 108 (H) 70 - 99 mg/dL Final    Patient Active Problem List   Diagnosis Date Noted   Osteoarthritis of both knees 10/12/2023   Osteoarthritis of left hip 10/12/2023   Adenomatous polyp of colon 10/23/2022   Coronary artery disease involving native coronary  artery of native heart without angina pectoris 10/13/2022   Lung nodule 10/13/2022   Insomnia 10/13/2022   History of right hip replacement 10/10/2020   OSA (obstructive sleep apnea) 09/08/2019   Ganglion cyst of dorsum of left wrist 02/05/2018   LVH (left ventricular hypertrophy) due to hypertensive disease, without heart failure 09/23/2017   Hyperlipidemia, mixed 08/28/2017   Hypercalcemia due to thiazide and vitamin A 08/25/2017   Benign essential hypertension 08/18/2017   GAD (  generalized anxiety disorder) 08/18/2017   GERD (gastroesophageal reflux disease) 08/18/2017   Panic attack 08/18/2017    Past Surgical History:  Procedure Laterality Date   ABDOMINAL HYSTERECTOMY     Complete Hysterectomy   CHOLECYSTECTOMY     COLONOSCOPY WITH PROPOFOL  N/A 10/23/2022   Procedure: COLONOSCOPY WITH PROPOFOL ;  Surgeon: Therisa Bi, MD;  Location: [facility] ENDOSCOPY;  Service: Gastroenterology;  Laterality: N/A;   GANGLION CYST EXCISION Left 03/01/2018   Procedure: REMOVAL GANGLION OF WRIST;  Surgeon: Cleotilde Barrio, MD;  Location: [facility] ORS;  Service: Orthopedics;  Laterality: Left;   JOINT REPLACEMENT     TOTAL HIP ARTHROPLASTY Right 10/10/2020   Procedure: TOTAL HIP ARTHROPLASTY ANTERIOR APPROACH;  Surgeon: Leora Lynwood SAUNDERS, MD;  Location: [facility] ORS;  Service: Orthopedics;  Laterality: Right;    Family History  Problem Relation Age of Onset   Hypertension Mother    Stroke Father    Hypertension Father    Hypertension Sister    Hypertension Sister    Hypertension Brother    Cerebral aneurysm Daughter    Breast cancer Neg Hx     Social History   Socioeconomic History   Marital status: Married    Spouse name: [name]    Number of children: 3   Years of education: Not on file   Highest education level: 12th grade  Occupational History   Occupation: medicare audits   Tobacco Use   Smoking status: Former    Current packs/day: 0.00    Average packs/day: 0.5 packs/day for 22.5 years  (11.2 ttl pk-yrs)    Types: Cigarettes    Start date: 08/19/1999    Quit date: 01/30/2022    Years since quitting: 2.1    Passive exposure: Never   Smokeless tobacco: Never  Vaping Use   Vaping status: Every Day   Substances: Nicotine , Flavoring  Substance and Sexual Activity   Alcohol use: Yes    Alcohol/week: 1.0 standard drink of alcohol    Types: 1 Glasses of wine per week    Comment: rare on Saturdays    Drug use: No   Sexual activity: Yes    Partners: Male    Birth control/protection: Other-see comments    Comment: Hysterectomy  Other Topics Concern   Not on file  Social History Narrative   Re-married since 2012   Only the two of them at home   She has three children from previous relationship and he has one   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    Social Drivers of Corporate investment banker Strain: Low Risk  (08/18/2017)   Overall Financial Resource Strain (CARDIA)    Difficulty of Paying Living Expenses: Not hard at all  Food Insecurity: No Food Insecurity (03/21/2024)   Hunger Vital Sign    Worried About Running Out of Food in the Last Year: Never true    Ran Out of Food in the Last Year: Never true  Transportation Needs: No Transportation Needs (03/21/2024)   PRAPARE - Administrator, Civil Service (Medical): No    Lack of Transportation (Non-Medical): No  Physical Activity: Insufficiently Active (08/18/2017)   Exercise Vital Sign    Days of Exercise per Week: 5 days    Minutes of Exercise per Session: 20 min  Stress: Stress Concern Present (08/18/2017)   [employer] of Occupational Health - Occupational Stress Questionnaire    Feeling of Stress : Rather much  Social Connections: Unknown (03/02/2023)   Received from [facility]   Social Network    Social Network: Not on file   Intimate Partner Violence: Not At Risk (03/21/2024)   Humiliation, Afraid, Rape, and Kick questionnaire    Fear of Current or Ex-Partner: No    Emotionally Abused: No    Physically Abused: No    Sexually Abused: No     Current Outpatient Medications:    aspirin  EC 81 MG tablet, Take 1 tablet (81 mg total) by mouth daily. Swallow whole., Disp: 90 tablet, Rfl: 3   fluticasone  (FLONASE ) 50 MCG/ACT nasal spray, SHAKE LIQUID AND USE 2 SPRAYS IN EACH NOSTRIL DAILY AS NEEDED FOR ALLERGIES, Disp: 48 g, Rfl: 0   Olmesartan -amLODIPine -HCTZ 40-5-25 MG TABS, Take 1 tablet by mouth every morning., Disp: 90 tablet, Rfl: 1   pantoprazole  (PROTONIX ) 20 MG tablet, Take 1 tablet (20 mg total) by mouth daily., Disp: 90 tablet, Rfl: 1   rosuvastatin  (CRESTOR ) 20 MG tablet, Take 1 tablet (20 mg total) by mouth daily., Disp: 90 tablet, Rfl: 1   Semaglutide -Weight Management (WEGOVY ) 2.4 MG/0.75ML SOAJ, Inject 2.4 mg into the skin once a week., Disp: 9 mL, Rfl: 0  Allergies  Allergen Reactions   Penicillins Itching and Swelling    Has patient had a PCN reaction causing immediate rash, facial/tongue/throat swelling, SOB or lightheadedness with hypotension: No Has patient had a PCN reaction causing severe rash involving mucus membranes or skin necrosis: No Has patient had a PCN reaction that required hospitalization: No Has patient had a PCN reaction occurring within the last 10 years: No If all of the above answers are NO, then may proceed with Cephalosporin use.      ROS  Constitutional: Negative for fever or weight change.  Respiratory: Negative for cough and shortness of breath.   Cardiovascular: Negative for chest pain or palpitations.  Gastrointestinal: Negative for abdominal pain, no bowel changes.  Musculoskeletal: Negative for gait problem or joint swelling.  Skin: Negative for rash.  Neurological: Negative for dizziness or headache.  No other specific complaints in a complete review of  systems (except as listed in HPI above).   Objective  Vitals:   03/21/24 0948  BP: 128/80  Pulse: 80  Resp: 18  Temp: 97.7 F (36.5 C)  SpO2: 99%  Weight: 165 lb 4.8 oz (75 kg)  Height: 5' 1 (1.549 m)    Body mass index is 31.23 kg/m.  Physical Exam Vitals reviewed.  Constitutional:      Appearance: Normal appearance.  HENT:     Head: Normocephalic.     Right Ear: Tympanic membrane normal.     Left Ear: Tympanic membrane normal.     Nose: Nose normal.  Eyes:     Extraocular Movements: Extraocular movements intact.     Conjunctiva/sclera: Conjunctivae normal.     Pupils: Pupils are equal, round, and reactive to light.  Neck:     Thyroid : No thyroid  mass, thyromegaly or thyroid  tenderness.  Cardiovascular:     Rate and Rhythm: Normal rate and regular rhythm.     Pulses: Normal pulses.     Heart sounds: Normal heart sounds.  Pulmonary:     Effort: Pulmonary effort is normal.     Breath sounds: Normal breath sounds.  Abdominal:     General: Bowel sounds are normal.  Palpations: Abdomen is soft.  Musculoskeletal:        General: Normal range of motion.     Cervical back: Normal range of motion and neck supple.     Right lower leg: No edema.     Left lower leg: No edema.  Skin:    General: Skin is warm and dry.     Capillary Refill: Capillary refill takes less than 2 seconds.  Neurological:     General: No focal deficit present.     Mental Status: She is alert and oriented to person, place, and time. Mental status is at baseline.  Psychiatric:        Mood and Affect: Mood normal.        Behavior: Behavior normal.        Thought Content: Thought content normal.        Judgment: Judgment normal.      Recent Results (from the past 2160 hours)  POCT glycosylated hemoglobin (Hb A1C)     Status: Abnormal   Collection Time: 02/25/24  3:10 PM  Result Value Ref Range   Hemoglobin A1C 6.1 (A) 4.0 - 5.6 %   HbA1c POC (<> result, manual entry)     HbA1c, POC  (prediabetic range)     HbA1c, POC (controlled diabetic range)        Fall Risk:    03/21/2024    9:50 AM 02/25/2024    2:59 PM 07/31/2023    8:07 AM 05/04/2023    8:32 AM 01/15/2023    8:40 AM  Fall Risk   Falls in the past year? 0 0 0 0 0  Number falls in past yr: 0 0 0 0 0  Injury with Fall? 0 0 0 0 0  Risk for fall due to :  No Fall Risks No Fall Risks No Fall Risks No Fall Risks  Follow up  Falls evaluation completed Falls prevention discussed;Education provided;Falls evaluation completed Falls prevention discussed Falls prevention discussed     Functional Status Survey: Is the patient deaf or have difficulty hearing?: No Does the patient have difficulty seeing, even when wearing glasses/contacts?: No Does the patient have difficulty concentrating, remembering, or making decisions?: No Does the patient have difficulty walking or climbing stairs?: No Does the patient have difficulty dressing or bathing?: No Does the patient have difficulty doing errands alone such as visiting a doctor's office or shopping?: No   Assessment & Plan  Problem List Items Addressed This Visit   None Visit Diagnoses       Annual physical exam    -  Primary   Relevant Orders   Lipid panel      Assessment and Plan Assessment & Plan Adult Wellness Visit Routine adult wellness visit. Depression screening negative. Blood pressure is well-controlled at 128/80 mmHg. BMI is 31.23, indicating obesity. Waist circumference is 40.5 inches. She is up to date on mammogram and colon cancer screening. No issues with incontinence or violence at home. Not currently sexually active. No living will in place. Last dental exam was 6-7 months ago. Needs to schedule an eye exam. - Provide lab slip for [facility] for routine tests. - Discuss the importance of a balanced diet and regular exercise.    -USPSTF grade A and B recommendations reviewed with patient; age-appropriate recommendations, preventive care, screening  tests, etc discussed and encouraged; healthy living encouraged; see AVS for patient education given to patient -Discussed importance of 150 minutes of physical activity weekly, eat two servings of  fish weekly, eat one serving of tree nuts ( cashews, pistachios, pecans, almonds.SABRA) every other day, eat 6 servings of fruit/vegetables daily and drink plenty of water and avoid sweet beverages.   -Reviewed Health Maintenance: yes

## 2024-03-22 ENCOUNTER — Ambulatory Visit: Payer: Self-pay | Admitting: Nurse Practitioner

## 2024-03-22 LAB — LIPID PANEL
Chol/HDL Ratio: 3.3 ratio (ref 0.0–4.4)
Cholesterol, Total: 177 mg/dL (ref 100–199)
HDL: 53 mg/dL (ref >39)
LDL Chol Calc (NIH): 110 mg/dL — ABNORMAL HIGH (ref 0–99)
Triglycerides: 73 mg/dL (ref 0–149)
VLDL Cholesterol Cal: 14 mg/dL (ref 5–40)

## 2024-03-25 ENCOUNTER — Encounter: Admitting: Family Medicine

## 2024-04-13 NOTE — Telephone Encounter (Signed)
 Spoke to patient over the phone and explained

## 2024-05-27 ENCOUNTER — Ambulatory Visit (INDEPENDENT_AMBULATORY_CARE_PROVIDER_SITE_OTHER): Admitting: Family Medicine

## 2024-05-27 ENCOUNTER — Encounter: Payer: Self-pay | Admitting: Family Medicine

## 2024-05-27 VITALS — BP 132/70 | HR 87 | Resp 16 | Ht 61.0 in | Wt 166.0 lb

## 2024-05-27 DIAGNOSIS — G4733 Obstructive sleep apnea (adult) (pediatric): Secondary | ICD-10-CM | POA: Diagnosis not present

## 2024-05-27 DIAGNOSIS — I119 Hypertensive heart disease without heart failure: Secondary | ICD-10-CM | POA: Diagnosis not present

## 2024-05-27 DIAGNOSIS — F409 Phobic anxiety disorder, unspecified: Secondary | ICD-10-CM

## 2024-05-27 DIAGNOSIS — E1169 Type 2 diabetes mellitus with other specified complication: Secondary | ICD-10-CM

## 2024-05-27 DIAGNOSIS — E1159 Type 2 diabetes mellitus with other circulatory complications: Secondary | ICD-10-CM

## 2024-05-27 DIAGNOSIS — E66811 Obesity, class 1: Secondary | ICD-10-CM

## 2024-05-27 DIAGNOSIS — F5105 Insomnia due to other mental disorder: Secondary | ICD-10-CM

## 2024-05-27 DIAGNOSIS — M1612 Unilateral primary osteoarthritis, left hip: Secondary | ICD-10-CM

## 2024-05-27 DIAGNOSIS — F411 Generalized anxiety disorder: Secondary | ICD-10-CM | POA: Diagnosis not present

## 2024-05-27 DIAGNOSIS — I152 Hypertension secondary to endocrine disorders: Secondary | ICD-10-CM

## 2024-05-27 DIAGNOSIS — E785 Hyperlipidemia, unspecified: Secondary | ICD-10-CM

## 2024-05-27 LAB — POCT GLYCOSYLATED HEMOGLOBIN (HGB A1C): Hemoglobin A1C: 4.3 % (ref 4.0–5.6)

## 2024-05-27 MED ORDER — WEGOVY 2.4 MG/0.75ML ~~LOC~~ SOAJ: 2.4000 mg | SUBCUTANEOUS | 0 refills | Status: AC

## 2024-05-27 MED ORDER — HYDROXYZINE HCL 10 MG PO TABS: 10.0000 mg | ORAL_TABLET | Freq: Every day | ORAL | 0 refills | Status: AC

## 2024-05-27 MED ORDER — DULOXETINE HCL 30 MG PO CPEP: 30.0000 mg | ORAL_CAPSULE | Freq: Every day | ORAL | 0 refills | Status: AC

## 2024-05-27 NOTE — Progress Notes (Signed)
 Name: [name]   MRN: [id number]    DOB: March 18, 1966   Date:05/27/2024       Progress Note  Subjective  Chief Complaint  Chief Complaint  Patient presents with   Medical Management of Chronic Issues   Discussed the use of AI scribe software for clinical note transcription with the patient, who gave verbal consent to proceed.  History of Present Illness [name] is a 58 year old female with type 2 diabetes, hypertension, and insomnia who presents for a routine follow-up.  She experiences ongoing insomnia, characterized by difficulty falling asleep due to a 'busy mind' and racing thoughts, often staying awake until 2 or 3 AM. She feels unrested upon waking and attributes some anxiety to concerns about her son, who has recently returned home. She has been using Tylenol  PM but dislikes it and has previously tried trazodone  without success. No headaches reported.  She is on a three-month follow-up for type 2 diabetes and hypertension. She is currently taking Wegovy , a GLP-1 agonist, for diabetes management and weight loss. Her last A1c was 6.1, down from 6.6 in March 2024. She reports a weight loss from 185 lbs to 166  lbs, which has also helped manage her blood pressure. No side effects from Wegovy  and no issues with appetite, thirst, or urination. Recent blood pressure readings at 132/70.  She has a history of bilateral osteoarthritis hips,  with a right hip replacement already completed. She has left hip osteoarthritis and multilevel spondylosis lumbar spine. She is taking gabapentin  100 mg twice daily, which provides some relief. She also takes vitamin D for previously low levels.  She has a history of coronary artery disease and is on rosuvastatin  for dyslipidemia. Her LDL has increased from 78 to 110. No muscle pain associated with rosuvastatin  use.  She has a history of obstructive sleep apnea but has not used a CPAP machine, attributing improvement to weight loss. Her husband  notes she snores less and she states only feeling tired during the day due to poor sleep/anxiety related . She quit smoking in 2023 with the help of medication.  She has a history of anxiety, which she associates with her son's past incarceration. She previously took Lexapro  and citalopram but has stopped. She reports ongoing anxiety, which she associates with her son's behavior and past legal issues.    Patient Active Problem List   Diagnosis Date Noted   Osteoarthritis of both knees 10/12/2023   Osteoarthritis of left hip 10/12/2023   Adenomatous polyp of colon 10/23/2022   Coronary artery disease involving native coronary artery of native heart without angina pectoris 10/13/2022   Lung nodule 10/13/2022   Insomnia 10/13/2022   History of right hip replacement 10/10/2020   OSA (obstructive sleep apnea) 09/08/2019   Ganglion cyst of dorsum of left wrist 02/05/2018   LVH (left ventricular hypertrophy) due to hypertensive disease, without heart failure 09/23/2017   Hyperlipidemia, mixed 08/28/2017   Hypercalcemia due to thiazide and vitamin A 08/25/2017   Benign essential hypertension 08/18/2017   GAD (generalized anxiety disorder) 08/18/2017   GERD (gastroesophageal reflux disease) 08/18/2017   Panic attack 08/18/2017    Past Surgical History:  Procedure Laterality Date   ABDOMINAL HYSTERECTOMY     Complete Hysterectomy   CHOLECYSTECTOMY     COLONOSCOPY WITH PROPOFOL  N/A 10/23/2022   Procedure: COLONOSCOPY WITH PROPOFOL ;  Surgeon: Therisa Bi, MD;  Location: [facility] ENDOSCOPY;  Service: Gastroenterology;  Laterality: N/A;   GANGLION CYST EXCISION Left 03/01/2018  Procedure: REMOVAL GANGLION OF WRIST;  Surgeon: Cleotilde Barrio, MD;  Location: [facility] ORS;  Service: Orthopedics;  Laterality: Left;   JOINT REPLACEMENT     TOTAL HIP ARTHROPLASTY Right 10/10/2020   Procedure: TOTAL HIP ARTHROPLASTY ANTERIOR APPROACH;  Surgeon: Leora Lynwood SAUNDERS, MD;  Location: [facility] ORS;  Service: Orthopedics;   Laterality: Right;    Family History  Problem Relation Age of Onset   Hypertension Mother    Stroke Father    Hypertension Father    Hypertension Sister    Hypertension Sister    Hypertension Brother    Cerebral aneurysm Daughter    Breast cancer Neg Hx     Social History   Tobacco Use   Smoking status: Former    Current packs/day: 0.00    Average packs/day: 0.5 packs/day for 22.5 years (11.2 ttl pk-yrs)    Types: Cigarettes    Start date: 08/19/1999    Quit date: 01/30/2022    Years since quitting: 2.3    Passive exposure: Never   Smokeless tobacco: Never  Substance Use Topics   Alcohol use: Yes    Alcohol/week: 1.0 standard drink of alcohol    Types: 1 Glasses of wine per week    Comment: rare on Saturdays      Current Outpatient Medications:    aspirin  EC 81 MG tablet, Take 1 tablet (81 mg total) by mouth daily. Swallow whole., Disp: 90 tablet, Rfl: 3   fluticasone  (FLONASE ) 50 MCG/ACT nasal spray, SHAKE LIQUID AND USE 2 SPRAYS IN EACH NOSTRIL DAILY AS NEEDED FOR ALLERGIES, Disp: 48 g, Rfl: 0   Olmesartan -amLODIPine -HCTZ 40-5-25 MG TABS, Take 1 tablet by mouth every morning., Disp: 90 tablet, Rfl: 1   pantoprazole  (PROTONIX ) 20 MG tablet, Take 1 tablet (20 mg total) by mouth daily., Disp: 90 tablet, Rfl: 1   rosuvastatin  (CRESTOR ) 20 MG tablet, Take 1 tablet (20 mg total) by mouth daily., Disp: 90 tablet, Rfl: 1   Semaglutide -Weight Management (WEGOVY ) 2.4 MG/0.75ML SOAJ, Inject 2.4 mg into the skin once a week., Disp: 9 mL, Rfl: 0  Allergies  Allergen Reactions   Penicillins Itching and Swelling    Has patient had a PCN reaction causing immediate rash, facial/tongue/throat swelling, SOB or lightheadedness with hypotension: No Has patient had a PCN reaction causing severe rash involving mucus membranes or skin necrosis: No Has patient had a PCN reaction that required hospitalization: No Has patient had a PCN reaction occurring within the last 10 years: No If all of  the above answers are NO, then may proceed with Cephalosporin use.     I personally reviewed active problem list, medication list, allergies, family history with the patient/caregiver today.   ROS  Ten systems reviewed and is negative except as mentioned in HPI    Objective Physical Exam VITALS: BP- 132/70 MEASUREMENTS: Weight- 137. CONSTITUTIONAL: Patient appears well-developed and well-nourished.  No distress. HEENT: Head atraumatic, normocephalic, neck supple. CARDIOVASCULAR: Normal rate, regular rhythm and normal heart sounds.  No murmur heard. No BLE edema. PULMONARY: Effort normal and breath sounds normal. No respiratory distress. ABDOMINAL: There is no tenderness or distention. MUSCULOSKELETAL: Normal gait. Without gross motor or sensory deficit. PSYCHIATRIC: Patient has a normal mood and affect. behavior is normal. Judgment and thought content normal.  Vitals:   05/27/24 1429  BP: 132/70  Pulse: 87  Resp: 16  SpO2: 98%  Weight: 166 lb (75.3 kg)  Height: 5' 1 (1.549 m)    Body mass index is 31.37 kg/m.  Recent Results (from the past 2160 hours)  Lipid panel     Status: Abnormal   Collection Time: 03/21/24 11:28 AM  Result Value Ref Range   Cholesterol, Total 177 100 - 199 mg/dL   Triglycerides 73 0 - 149 mg/dL   HDL 53 >60 mg/dL   VLDL Cholesterol Cal 14 5 - 40 mg/dL   LDL Chol Calc (NIH) 889 (H) 0 - 99 mg/dL   Chol/HDL Ratio 3.3 0.0 - 4.4 ratio    Comment:                                   T. Chol/HDL Ratio                                             Men  Women                               1/2 Avg.Risk  3.4    3.3                                   Avg.Risk  5.0    4.4                                2X Avg.Risk  9.6    7.1                                3X Avg.Risk 23.4   11.0     Diabetic Foot Exam:     PHQ2/9:    03/21/2024    9:50 AM 02/25/2024    2:59 PM 11/27/2023    3:26 PM 07/31/2023    8:12 AM 05/04/2023    8:33 AM  Depression screen PHQ  2/9  Decreased Interest 0 0 0 0 1  Down, Depressed, Hopeless 0 0 0 0 0  PHQ - 2 Score 0 0 0 0 1  Altered sleeping 0  0 0 0  Tired, decreased energy 0  0 0 0  Change in appetite 0  0 0 0  Feeling bad or failure about yourself  0  0 0 0  Trouble concentrating 0  0 0 0  Moving slowly or fidgety/restless 0  0 0 0  Suicidal thoughts 0  0 0 0  PHQ-9 Score 0  0 0 1  Difficult doing work/chores Not difficult at all  Not difficult at all Not difficult at all     phq 9 is negative  Fall Risk:    03/21/2024    9:50 AM 02/25/2024    2:59 PM 07/31/2023    8:07 AM 05/04/2023    8:32 AM 01/15/2023    8:40 AM  Fall Risk   Falls in the past year? 0 0 0 0 0  Number falls in past yr: 0 0 0 0 0  Injury with Fall? 0 0 0 0 0  Risk for fall due to :  No Fall Risks No Fall Risks No Fall Risks No Fall Risks  Follow up  Falls evaluation completed Falls  prevention discussed;Education provided;Falls evaluation completed Falls prevention discussed Falls prevention discussed      Assessment & Plan Type 2 diabetes mellitus with associated HTN and dyslipidemia Well-controlled with A1c at 6.1% last visit and now below 5 % but denies hypoglycemic episodes - Check A1c today. - Continue Wegovy .  Hypertension Managed with olmesartan  amlodipine  HCTZ. Blood pressure slightly elevated at 132/70 mmHg. - Monitor blood pressure at home. - Adjust medication if blood pressure remains above 130 mmHg.  Obesity Significant weight loss from 182 lbs to 137 lbs. BMI decreased from 35. - Continue Wegovy . - Document weight loss progress.  Dyslipidemia LDL increased to 110 mg/dL. No muscle pain reported. - Continue rosuvastatin . - Recheck lipid panel if LDL remains elevated.  Coronary artery disease Managed with aspirin  and statins. No recent cardiac events. - Continue aspirin  and rosuvastatin .  Unilateral primary osteoarthritis, left hip Mild to moderate osteoarthritis. Gabapentin  provides partial relief. -  Continue gabapentin . - Consider hip replacement if pain persists.  Multilevel spondylosis (spine) Mild multilevel spondylosis. Gabapentin  may provide relief. - Continue gabapentin .  Fibromyalgia Fibromyalgia with myofascial pain. Gabapentin  provides some relief. - Continue gabapentin . - Add duloxetine at 30 mg and titrate up to 60 mg after first week   Obstructive sleep apnea Weight loss improved symptoms. Reduced snoring reported. - Monitor symptoms. - Consider CPAP if symptoms worsen.  Insomnia and generalized anxiety disorder Chronic insomnia and anxiety. Previous trazodone  ineffective. Discussed duloxetine for anxiety and pain, hydroxyzine  for sleep. - Start duloxetine, titrate to 60 mg daily. - Prescribe hydroxyzine  for sleep as needed. - Implement sleep hygiene and relaxation techniques.

## 2024-08-26 ENCOUNTER — Ambulatory Visit: Admitting: Family Medicine

## 2024-08-29 ENCOUNTER — Ambulatory Visit: Admitting: Family Medicine

## 2024-09-12 ENCOUNTER — Ambulatory Visit: Admitting: Family Medicine
